# Patient Record
Sex: Male | Born: 1950 | Race: White | Hispanic: No | Marital: Married | Smoking: Never smoker
Health system: Southern US, Community
[De-identification: ages and names within clinical notes are randomized; demographics above are authoritative.]

## PROBLEM LIST (undated history)

## (undated) DIAGNOSIS — I499 Cardiac arrhythmia, unspecified: Secondary | ICD-10-CM

## (undated) DIAGNOSIS — E785 Hyperlipidemia, unspecified: Secondary | ICD-10-CM

## (undated) DIAGNOSIS — M1990 Unspecified osteoarthritis, unspecified site: Secondary | ICD-10-CM

## (undated) DIAGNOSIS — C801 Malignant (primary) neoplasm, unspecified: Secondary | ICD-10-CM

## (undated) DIAGNOSIS — K603 Anal fistula, unspecified: Secondary | ICD-10-CM

## (undated) DIAGNOSIS — I2510 Atherosclerotic heart disease of native coronary artery without angina pectoris: Secondary | ICD-10-CM

## (undated) DIAGNOSIS — C61 Malignant neoplasm of prostate: Secondary | ICD-10-CM

## (undated) DIAGNOSIS — R7303 Prediabetes: Secondary | ICD-10-CM

## (undated) DIAGNOSIS — G4733 Obstructive sleep apnea (adult) (pediatric): Secondary | ICD-10-CM

## (undated) DIAGNOSIS — I10 Essential (primary) hypertension: Secondary | ICD-10-CM

## (undated) DIAGNOSIS — K219 Gastro-esophageal reflux disease without esophagitis: Secondary | ICD-10-CM

## (undated) DIAGNOSIS — F32A Depression, unspecified: Secondary | ICD-10-CM

## (undated) DIAGNOSIS — G4730 Sleep apnea, unspecified: Secondary | ICD-10-CM

## (undated) HISTORY — DX: Essential (primary) hypertension: I10

## (undated) HISTORY — DX: Hyperlipidemia, unspecified: E78.5

## (undated) HISTORY — PX: CARDIAC CATHETERIZATION: SHX172

## (undated) HISTORY — DX: Atherosclerotic heart disease of native coronary artery without angina pectoris: I25.10

## (undated) HISTORY — PX: ABDOMINAL SURGERY: SHX537

## (undated) HISTORY — PX: COLONOSCOPY: SHX174

## (undated) HISTORY — DX: Gastro-esophageal reflux disease without esophagitis: K21.9

## (undated) HISTORY — DX: Morbid (severe) obesity due to excess calories: E66.01

---

## 2009-12-31 ENCOUNTER — Encounter: Payer: Self-pay | Admitting: Cardiology

## 2010-01-01 ENCOUNTER — Encounter: Payer: Self-pay | Admitting: Cardiology

## 2010-01-02 ENCOUNTER — Ambulatory Visit
Admission: AD | Admit: 2010-01-02 | Payer: Self-pay | Source: Home / Self Care | Attending: Cardiovascular Disease | Admitting: Cardiovascular Disease

## 2010-01-23 ENCOUNTER — Encounter: Payer: Self-pay | Admitting: Cardiology

## 2010-01-23 ENCOUNTER — Ambulatory Visit
Admission: RE | Admit: 2010-01-23 | Discharge: 2010-01-23 | Payer: Self-pay | Source: Home / Self Care | Attending: Cardiology | Admitting: Cardiology

## 2010-01-23 DIAGNOSIS — E669 Obesity, unspecified: Secondary | ICD-10-CM | POA: Insufficient documentation

## 2010-01-23 DIAGNOSIS — K219 Gastro-esophageal reflux disease without esophagitis: Secondary | ICD-10-CM | POA: Insufficient documentation

## 2010-01-23 DIAGNOSIS — R079 Chest pain, unspecified: Secondary | ICD-10-CM | POA: Insufficient documentation

## 2010-01-23 DIAGNOSIS — G4733 Obstructive sleep apnea (adult) (pediatric): Secondary | ICD-10-CM | POA: Insufficient documentation

## 2010-01-23 DIAGNOSIS — E7800 Pure hypercholesterolemia, unspecified: Secondary | ICD-10-CM | POA: Insufficient documentation

## 2010-02-05 ENCOUNTER — Encounter: Payer: Self-pay | Admitting: Cardiology

## 2010-02-19 NOTE — Letter (Signed)
Summary: Internal Correspondence/ FAXED PRE-CATH ORDERS  Internal Correspondence/ FAXED PRE-CATH ORDERS   Imported By: Dorise Hiss 01/13/2010 12:12:20  _____________________________________________________________________  External Attachment:    Type:   Image     Comment:   External Document

## 2010-02-19 NOTE — Assessment & Plan Note (Signed)
Summary: Phillip Rios   Visit Type:  Follow-up Primary Provider:  Kirstie Peri   History of Present Illness: the patient is a 60 year old male who was recently hospitalized at [facility] with atypical chest pain. The patient had negative cardiac markers but given the concern of multiple risk factors we proceeded with a cardiac catheterization. Catheterization demonstrated no significant coronary artery disease with normal right-sided heart pressures. LV function was also within normal limits. The patient states that he still is occasionally sharp chest pains on the left side particularly knifelike sensation. The only lasts a few seconds. He also reports severe symptoms of esophageal reflux disease with a sour taste in his mouth that he can taste pretty much throughout the whole night. He has been taking Zantac without much relief. He also reports significant snoring as told by his wife. She also has noticed that time she stops breathing. The patient's neck anatomy is compatible with obstructive sleep apnea.  From a cardiovascular standpoint is stable. He reports no groin complications. He reports no palpitations or syncope he has no orthopnea PND.  Preventive Screening-Counseling & Management  Alcohol-Tobacco     Smoking Status: never  Problems Prior to Update: 1)  Chest Pain Unspecified  (ICD-786.50) 2)  Esophageal Reflux  (ICD-530.81) 3)  Pure Hypercholesterolemia  (ICD-272.0) 4)  Long-term (CURRENT) Use of Other Medications  (ICD-V58.69) 5)  Long-term Use of Aspirin  (ICD-V58.66) 6)  Obesity, Unspecified  (ICD-278.00)  Current Medications (verified): 1)  Lisinopril 20 Mg Tabs (Lisinopril) .... Take 2 Tablet By Mouth Once A Day 2)  Amlodipine Besylate 10 Mg Tabs (Amlodipine Besylate) .... Take 1 Tablet By Mouth Once A Day 3)  Aspirin Ec 325 Mg Tbec (Aspirin) .... Take One Tablet By Mouth Daily 4)  Nexium 40 Mg Cpdr (Esomeprazole Magnesium) .... Take 1 Tablet By Mouth Once A  Day  Allergies (verified): No Known Drug Allergies  Comments:  Nurse/Medical Assistant: The patient's medications and allergies were reviewed with the patient and were updated in the Medication and Allergy Lists. Reviewed w/ pt. Tammi Romine CMA 02-18-2010 10:02 AM)  Past History:  Family History: Last updated: 2010/02/18 Father: deceased with HX of CHF and CAD Mother: Deceased Brother age 42 ,status post pacemaker  Social History: Last updated: 2010-02-18 Married  Tobacco Use - No.  Alcohol Use - no Works as a Games developer  Past Medical History: G E R D Hypertension Morbid Obesity Dyslipidemia cardiac catheterization December 2011 nonobstructive coronary artery disease, normal LV function and normal right-sided heart pressures.  Review of Systems       The patient complains of sleep apnea.  The patient denies fatigue, malaise, fever, weight gain/loss, vision loss, decreased hearing, hoarseness, chest pain, palpitations, shortness of breath, prolonged cough, wheezing, coughing up blood, abdominal pain, blood in stool, nausea, vomiting, diarrhea, heartburn, incontinence, blood in urine, muscle weakness, joint pain, leg swelling, rash, skin lesions, headache, fainting, dizziness, depression, anxiety, enlarged lymph nodes, easy bruising or bleeding, and environmental allergies.    Vital Signs:  Patient profile:   60 year old male Height:      73 inches Weight:      313 pounds BMI:     41.44 Pulse rate:   78 / minute BP sitting:   119 / 84  (left arm) Cuff size:   large  Vitals Entered By: Fuller Plan CMA February 18, 2010 10:02 AM)  Physical Exam  Additional Exam:  General: Overweight white male. head: Normocephalic  and atraumatic eyes PERRLA/EOMI intact, conjunctiva and lids normal nose: No deformity or lesions mouth normal dentition, normal posterior pharynx neck: Supple, no JVD.  No masses, thyromegaly or abnormal cervical nodes lungs: Normal breath  sounds bilaterally without wheezing.  Normal percussion heart: regular rate and rhythm with normal S1 and S2, no S3 or S4.  PMI is normal.  No pathological murmurs abdomen: Normal bowel sounds, abdomen is soft and nontender without masses, organomegaly or hernias noted.  No hepatosplenomegaly musculoskeletal: Back normal, normal gait muscle strength and tone normal pulsus: Pulse is normal in all 4 extremities Extremities: No peripheral pitting edema neurologic: Alert and oriented x 3 skin: Intact without lesions or rashes cervical nodes: No significant adenopathy psychologic: Normal affect    EKG  Procedure date:  01/02/2010  Findings:      Left main coronary artery was a short segment and it was normal.   Left anterior descending artery had a 30% eccentric lesion proximally, before this for first septal perforator. Mid and distal LAD were normal. First and second diagonal branches were normal.   Circumflex coronary artery was nondominant. First obtuse marginal branch was normal. Second obtuse marginal branch was large and normal. AV groove branch was small and normal.   Right coronary artery was dominant and normal.   RAO ventriculography: RAO ventriculography was normal. EF was 65%. There was no gradient across the aortic valve and no MR.   Right heart catheterization was done due to the patient's dyspnea.   Mean right atrial pressure was 8, RV pressure was 33/8, PA pressure was 30/14, LV pressure was 128/11, aortic pressure was 128/77.   The cardiac output was 5.2 liters per minute with a cardiac index of 2.0 liters per minute per meter squared.  Impression & Recommendations:  Problem # 1:  CHEST PAIN UNSPECIFIED (ICD-786.50) patient has no significant coronary artery disease. I suspect he may have esophageal spasm. I changed his Zantac and Nexium 40 Milligrams p.o. q. dailyday. I advised him to stop Zantac. His updated medication list for this problem includes:     Lisinopril 20 Mg Tabs (Lisinopril) .Marland Kitchen... Take 2 tablet by mouth once a day    Amlodipine Besylate 10 Mg Tabs (Amlodipine besylate) .Marland Kitchen... Take 1 tablet by mouth once a day    Aspirin Ec 325 Mg Tbec (Aspirin) .Marland Kitchen... Take one tablet by mouth daily  Problem # 2:  ESOPHAGEAL REFLUX (ICD-530.81) start Nexium. Also discussed weight loss with the patient.  His updated medication list for this problem includes:    Nexium 40 Mg Cpdr (Esomeprazole magnesium) .Marland Kitchen... Take 1 tablet by mouth once a day  Problem # 3:  OBSTRUCTIVE SLEEP APNEA (ICD-327.23) the patient has symptoms that could be compatible with sleep apnea. He will be referred for a sleep study.  Patient Instructions: 1)  Stop Zantac 2)  Nexium 40mg  daily  3)  Referral to Neurology for Sleep Study 4)  Follow up in  6 months Prescriptions: NEXIUM 40 MG CPDR (ESOMEPRAZOLE MAGNESIUM) Take 1 tablet by mouth once a day  #30 x 6   Entered by:   Hoover Brunette, LPN   Authorized by:   Lewayne Bunting, MD, [facility]   Signed by:   Hoover Brunette, LPN on 16/10/9602   Method used:   Electronically to        [facility] (retail)       [address] [facility]  [place], [place]  [address]       Ph: [phone]       Fax: [phone]   RxID:   [id number]

## 2010-02-19 NOTE — Miscellaneous (Signed)
Summary: Orders Update - Neurology  Clinical Lists Changes  Orders: Added new Referral order of Neurology Referral (Neuro) - Signed

## 2010-02-19 NOTE — Letter (Signed)
Summary: External Correspondence/ FAXED REFERRAL [facility]  External Correspondence/ FAXED REFERRAL [facility]   Imported By: Dorise Hiss 02/06/2010 15:37:05  _____________________________________________________________________  External Attachment:    Type:   Image     Comment:   External Document

## 2010-02-19 NOTE — Medication Information (Signed)
Summary: [facility] D/C MEDICATION ORDERS  [facility] D/C MEDICATION ORDERS   Imported By: Zachary George 01/23/2010 09:13:53  _____________________________________________________________________  External Attachment:    Type:   Image     Comment:   External Document

## 2010-02-19 NOTE — Letter (Signed)
Summary: Internal Other/ PATIENT HISTORY FORM  Internal Other/ PATIENT HISTORY FORM   Imported By: Dorise Hiss 01/28/2010 12:15:49  _____________________________________________________________________  External Attachment:    Type:   Image     Comment:   External Document

## 2010-02-19 NOTE — Consult Note (Signed)
Summary: CARDIOLOGY CONSULT/ [facility]  CARDIOLOGY CONSULT/ [facility]   Imported By: Zachary George 01/23/2010 10:24:31  _____________________________________________________________________  External Attachment:    Type:   Image     Comment:   External Document

## 2010-03-30 LAB — POCT I-STAT 3, VENOUS BLOOD GAS (G3P V)
O2 Saturation: 62 %
TCO2: 25 mmol/L (ref 0–100)

## 2010-03-30 LAB — POCT I-STAT 3, ART BLOOD GAS (G3+)
Acid-Base Excess: 1 mmol/L (ref 0.0–2.0)
Bicarbonate: 25.8 mEq/L — ABNORMAL HIGH (ref 20.0–24.0)
O2 Saturation: 94 %

## 2011-08-05 ENCOUNTER — Encounter: Payer: Self-pay | Admitting: Cardiology

## 2016-01-19 DIAGNOSIS — I471 Supraventricular tachycardia, unspecified: Secondary | ICD-10-CM

## 2016-01-19 DIAGNOSIS — I4891 Unspecified atrial fibrillation: Secondary | ICD-10-CM

## 2016-01-19 HISTORY — DX: Supraventricular tachycardia, unspecified: I47.10

## 2016-01-19 HISTORY — DX: Unspecified atrial fibrillation: I48.91

## 2016-01-19 HISTORY — DX: Supraventricular tachycardia: I47.1

## 2016-06-22 ENCOUNTER — Telehealth: Payer: Self-pay | Admitting: Cardiology

## 2016-06-22 ENCOUNTER — Encounter: Payer: Self-pay | Admitting: Cardiology

## 2016-06-22 ENCOUNTER — Encounter: Payer: Self-pay | Admitting: *Deleted

## 2016-06-22 ENCOUNTER — Ambulatory Visit (INDEPENDENT_AMBULATORY_CARE_PROVIDER_SITE_OTHER): Payer: BLUE CROSS/BLUE SHIELD | Admitting: Cardiology

## 2016-06-22 VITALS — BP 135/81 | HR 84 | Ht 73.0 in | Wt 325.0 lb

## 2016-06-22 DIAGNOSIS — G4730 Sleep apnea, unspecified: Secondary | ICD-10-CM

## 2016-06-22 DIAGNOSIS — I480 Paroxysmal atrial fibrillation: Secondary | ICD-10-CM

## 2016-06-22 DIAGNOSIS — R002 Palpitations: Secondary | ICD-10-CM

## 2016-06-22 DIAGNOSIS — I471 Supraventricular tachycardia: Secondary | ICD-10-CM | POA: Diagnosis not present

## 2016-06-22 MED ORDER — METOPROLOL SUCCINATE ER 50 MG PO TB24: 50.0000 mg | ORAL_TABLET | Freq: Every day | ORAL | 1 refills | Status: DC

## 2016-06-22 NOTE — Patient Instructions (Signed)
Your physician recommends that you schedule a follow-up appointment in: [place] has recommended you make the following change in your medication:   INCREASE TOPROL XL 50 MG DAILY  Your physician has requested that you have an echocardiogram. Echocardiography is a painless test that uses sound waves to create images of your heart. It provides your doctor with information about the size and shape of your heart and how well your heart's chambers and valves are working. This procedure takes approximately one hour. There are no restrictions for this procedure.  You have been referred to DR [facility]  Thank you for choosing [facility]!!

## 2016-06-22 NOTE — Progress Notes (Signed)
Clinical Summary Mr. Janowicz is a 66 y.o.male seen previously by Dr Dannielle Burn in 2012. Referred today as a new patient by Dr Manuella Ghazi for palpitations.    1. Palpitations - started about 1 year ago - 2-3 times a week. Feeling of butterflies in chest, can have some chest pain with it. Can have some SOB - coffee x1, iced tea x 3-4 glasses, no sodas, no energy drinks, no EtoH.   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 From my review also short episode of afib.  Low heart rate of 35 while sleeping, sinus brady - on Metoprolol x 1-2 months, no change in symptoms.   2. Chest pain - previous cardiac cath without significant CAD in 2011 - no recent symptoms, primarily has had palpitations recently.   3. OSA  - wearing CPAP machine. His CPAP has not been monitored by a physician    Past Medical History:  Diagnosis Date  . Coronary artery disease    non obstructive  . Dyslipidemia   . GERD (gastroesophageal reflux disease)   . Hypertension   . Morbid obesity ([place])      Allergies not on file   Current Outpatient Prescriptions  Medication Sig Dispense Refill  . aspirin EC 81 MG tablet Take 81 mg by mouth daily.    . hydrochlorothiazide (HYDRODIURIL) 25 MG tablet Take 25 mg by mouth daily.    Marland Kitchen lisinopril (PRINIVIL,ZESTRIL) 20 MG tablet Take 40 mg by mouth daily.    . meloxicam (MOBIC) 15 MG tablet Take 15 mg by mouth daily.    . metoprolol succinate (TOPROL-XL) 25 MG 24 hr tablet Take 25 mg by mouth daily.    . pantoprazole (PROTONIX) 40 MG tablet Take 40 mg by mouth daily.     No current facility-administered medications for this visit.      Past Surgical History:  Procedure Laterality Date  . CARDIAC CATHETERIZATION       Allergies not on file    Family History  Problem Relation Age of Onset  . Heart failure Father   . Heart disease Father      Social History Mr. Herrod reports that he has never smoked. He does not have any  smokeless tobacco history on file. Mr. Seoane reports that he does not drink alcohol.   Review of Systems CONSTITUTIONAL: No weight loss, fever, chills, weakness or fatigue.  HEENT: Eyes: No visual loss, blurred vision, double vision or yellow sclerae.No hearing loss, sneezing, congestion, runny nose or sore throat.  SKIN: No rash or itching.  CARDIOVASCULAR: per hpi RESPIRATORY: No shortness of breath, cough or sputum.  GASTROINTESTINAL: No anorexia, nausea, vomiting or diarrhea. No abdominal pain or blood.  GENITOURINARY: No burning on urination, no polyuria NEUROLOGICAL: No headache, dizziness, syncope, paralysis, ataxia, numbness or tingling in the extremities. No change in bowel or bladder control.  MUSCULOSKELETAL: No muscle, back pain, joint pain or stiffness.  LYMPHATICS: No enlarged nodes. No history of splenectomy.  PSYCHIATRIC: No history of depression or anxiety.  ENDOCRINOLOGIC: No reports of sweating, cold or heat intolerance. No polyuria or polydipsia.  Marland Kitchen   Physical Examination Vitals:   06/22/16 0918 06/22/16 0921  BP: 139/90 135/81  Pulse: 86 84   Vitals:   06/22/16 0918  Weight: (!) 325 lb (147.4 kg)  Height: 6\' 1"  (1.854 m)    Gen: resting comfortably, no acute distress HEENT: no scleral icterus, pupils equal round and reactive, no palptable cervical  adenopathy,  CV: RRR, no m/r/g, no jvd Resp: Clear to auscultation bilaterally GI: abdomen is soft, non-tender, non-distended, normal bowel sounds, no hepatosplenomegaly MSK: extremities are warm, no edema.  Skin: warm, no rash Neuro:  no focal deficits Psych: appropriate affect   Diagnostic Studies 12/2009 cath Procedure date:  01/02/2010  Findings:      Left main coronary artery was a short segment and it was normal.   Left anterior descending artery had a 30% eccentric lesion proximally, before this for first septal perforator. Mid and distal LAD were normal. First and second diagonal branches  were normal.   Circumflex coronary artery was nondominant. First obtuse marginal Mendi Constable was normal. Second obtuse marginal Tiwanda Threats was large and normal. AV groove Rubert Frediani was small and normal.   Right coronary artery was dominant and normal.   RAO ventriculography: RAO ventriculography was normal. EF was 65%. There was no gradient across the aortic valve and no MR.   Right heart catheterization was done due to the patient's dyspnea.   Mean right atrial pressure was 8, RV pressure was 33/8, PA pressure was 30/14, LV pressure was 128/11, aortic pressure was 128/77.   The cardiac output was 5.2 liters per minute with a cardiac index of 2.0 liters per minute per meter squared.    Assessment and Plan  1. Palpitations - previous montior showed episodes of PSVT, short episode of afib - we will increase Toprol XL to 50mg  daily for ongoing symptoms - CHADS2VASC score is 1, he will continue ASA   2. OSA - refer to Dr Luan Pulling, could be playing a role in his paroxysmal arrhythmias  3. Chest pain - previous cath without signfiicant cad - no recent symptoms - continue to monitor   F/u 3 months   Arnoldo Lenis, M.D.

## 2016-06-22 NOTE — Telephone Encounter (Signed)
Pre-cert Verification for the following procedure    Echo scheduled for 07-14-16 at [facility]

## 2016-06-30 ENCOUNTER — Other Ambulatory Visit: Payer: Self-pay | Admitting: Cardiology

## 2016-06-30 DIAGNOSIS — I480 Paroxysmal atrial fibrillation: Secondary | ICD-10-CM

## 2016-07-14 ENCOUNTER — Other Ambulatory Visit: Payer: Self-pay

## 2016-07-14 ENCOUNTER — Ambulatory Visit (INDEPENDENT_AMBULATORY_CARE_PROVIDER_SITE_OTHER): Payer: BLUE CROSS/BLUE SHIELD

## 2016-07-14 DIAGNOSIS — I480 Paroxysmal atrial fibrillation: Secondary | ICD-10-CM | POA: Diagnosis not present

## 2016-08-27 ENCOUNTER — Other Ambulatory Visit: Payer: Self-pay | Admitting: Cardiology

## 2016-09-28 ENCOUNTER — Ambulatory Visit: Payer: BLUE CROSS/BLUE SHIELD | Admitting: Cardiology

## 2016-10-25 ENCOUNTER — Other Ambulatory Visit: Payer: Self-pay | Admitting: Cardiology

## 2016-11-09 ENCOUNTER — Ambulatory Visit (INDEPENDENT_AMBULATORY_CARE_PROVIDER_SITE_OTHER): Payer: BLUE CROSS/BLUE SHIELD | Admitting: Urology

## 2016-11-09 DIAGNOSIS — N401 Enlarged prostate with lower urinary tract symptoms: Secondary | ICD-10-CM

## 2016-11-09 DIAGNOSIS — R351 Nocturia: Secondary | ICD-10-CM | POA: Diagnosis not present

## 2016-11-09 DIAGNOSIS — R972 Elevated prostate specific antigen [PSA]: Secondary | ICD-10-CM | POA: Diagnosis not present

## 2016-11-16 ENCOUNTER — Other Ambulatory Visit: Payer: Self-pay | Admitting: Urology

## 2016-11-16 DIAGNOSIS — R972 Elevated prostate specific antigen [PSA]: Secondary | ICD-10-CM

## 2016-11-30 ENCOUNTER — Ambulatory Visit (HOSPITAL_COMMUNITY)
Admission: RE | Admit: 2016-11-30 | Discharge: 2016-11-30 | Disposition: A | Discharge status: Home / Self Care | Payer: BLUE CROSS/BLUE SHIELD | Source: Ambulatory Visit | Attending: Urology | Admitting: Urology

## 2016-11-30 DIAGNOSIS — C61 Malignant neoplasm of prostate: Secondary | ICD-10-CM | POA: Insufficient documentation

## 2016-11-30 DIAGNOSIS — R972 Elevated prostate specific antigen [PSA]: Secondary | ICD-10-CM

## 2016-11-30 MED ORDER — GENTAMICIN SULFATE 40 MG/ML IJ SOLN
INTRAMUSCULAR | Status: AC
  Administered 2016-11-30: 160 mg via INTRAMUSCULAR
  Filled 2016-11-30: qty 4

## 2016-11-30 MED ORDER — GENTAMICIN SULFATE 40 MG/ML IJ SOLN
160.0000 mg | Freq: Once | INTRAMUSCULAR | Status: AC
Start: 2016-11-30 — End: 2016-11-30
  Administered 2016-11-30: 160 mg via INTRAMUSCULAR

## 2016-11-30 MED ORDER — LIDOCAINE HCL (PF) 2 % IJ SOLN
INTRAMUSCULAR | Status: AC
  Filled 2016-11-30: qty 10

## 2016-11-30 NOTE — Discharge Instructions (Signed)
Transrectal Ultrasound-Guided Biopsy °A transrectal ultrasound-guided biopsy is a procedure to remove samples of tissue from your prostate using ultrasound images to guide the procedure. The procedure is usually done to evaluate the prostate gland of men who have an elevated prostate-specific antigen (PSA). PSA is a blood test to screen for prostate cancer. The biopsy samples are taken to check for prostate cancer. °Tell a health care provider about: °· Any allergies you have. °· All medicines you are taking, including vitamins, herbs, eye drops, creams, and over-the-counter medicines. °· Any problems you or family members have had with anesthetic medicines. °· Any blood disorders you have. °· Any surgeries you have had. °· Any medical conditions you have. °What are the risks? °Generally, this is a safe procedure. However, as with any procedure, problems can occur. Possible problems include: °· Infection of your prostate. °· Bleeding from your rectum or blood in your urine. °· Difficulty urinating. °· Nerve damage (this is usually temporary). °· Damage to surrounding structures such as blood vessels, organs, and muscles, which would require other procedures. ° °What happens before the procedure? °· Do not eat or drink anything after midnight on the night before the procedure or as directed by your health care provider. °· Take medicines only as directed by your health care provider. °· Your health care provider may have you stop taking certain medicines 5-7 days before the procedure. °· You will be given an enema before the procedure. During an enema, a liquid is injected into your rectum to clear out waste. °· You may have lab tests the day of your procedure. °· Plan to have someone take you home after the procedure. °What happens during the procedure? °· You will be given medicine to help you relax (sedative) before the procedure. An IV tube will be inserted into one of your veins and used to give fluids and  medicine. °· You will be given antibiotic medicine to reduce the risk of an infection. °· You will be placed on your side for the procedure. °· A probe with lubricated gel will be placed into your rectum, and images will be taken of your prostate and surrounding structures. °· Numbing medicine will be injected into the prostate before the biopsy samples are taken. °· A biopsy needle will then be inserted and guided to your prostate with the use of the ultrasound images. °· Samples of prostate tissue will be taken, and the needle will then be removed. °· The biopsy samples will be sent to a lab to be analyzed. Results are usually back in 2-3 days. °What happens after the procedure? °· You will be taken to a recovery area where you will be monitored. °· You may have some discomfort in the rectal area. You will be given pain medicines to control this. °· You may be allowed to go home the same day, or you may need to stay in the hospital overnight. °This information is not intended to replace advice given to you by your health care provider. Make sure you discuss any questions you have with your health care provider. °Document Released: 05/21/2013 Document Revised: 06/12/2015 Document Reviewed: 08/23/2012 °Elsevier Interactive Patient Education © 2018 [employer] ° °

## 2016-12-15 ENCOUNTER — Other Ambulatory Visit: Payer: Self-pay | Admitting: Urology

## 2016-12-15 DIAGNOSIS — C61 Malignant neoplasm of prostate: Secondary | ICD-10-CM

## 2016-12-22 ENCOUNTER — Encounter (HOSPITAL_COMMUNITY): Payer: Self-pay

## 2016-12-22 ENCOUNTER — Encounter (HOSPITAL_COMMUNITY)
Admission: RE | Admit: 2016-12-22 | Discharge: 2016-12-22 | Disposition: A | Discharge status: Home / Self Care | Payer: BLUE CROSS/BLUE SHIELD | Source: Ambulatory Visit | Attending: Urology | Admitting: Urology

## 2016-12-22 DIAGNOSIS — C61 Malignant neoplasm of prostate: Secondary | ICD-10-CM | POA: Insufficient documentation

## 2016-12-22 HISTORY — DX: Malignant (primary) neoplasm, unspecified: C80.1

## 2016-12-22 MED ORDER — TECHNETIUM TC 99M MEDRONATE IV KIT
20.0000 | PACK | Freq: Once | INTRAVENOUS | Status: AC | PRN
  Administered 2016-12-22: 19.95 via INTRAVENOUS

## 2016-12-24 ENCOUNTER — Other Ambulatory Visit: Payer: Self-pay | Admitting: Urology

## 2016-12-24 DIAGNOSIS — C61 Malignant neoplasm of prostate: Secondary | ICD-10-CM

## 2017-01-06 ENCOUNTER — Encounter (HOSPITAL_COMMUNITY): Payer: Self-pay

## 2017-01-06 ENCOUNTER — Ambulatory Visit (HOSPITAL_COMMUNITY)
Admission: RE | Admit: 2017-01-06 | Discharge: 2017-01-06 | Disposition: A | Discharge status: Home / Self Care | Payer: BLUE CROSS/BLUE SHIELD | Source: Ambulatory Visit | Attending: Urology | Admitting: Urology

## 2017-01-06 DIAGNOSIS — M899 Disorder of bone, unspecified: Secondary | ICD-10-CM | POA: Diagnosis not present

## 2017-01-06 DIAGNOSIS — C61 Malignant neoplasm of prostate: Secondary | ICD-10-CM | POA: Diagnosis present

## 2017-01-06 LAB — POCT I-STAT, CHEM 8
BUN: 18 mg/dL (ref 6–20)
CHLORIDE: 102 mmol/L (ref 101–111)
CREATININE: 1.2 mg/dL (ref 0.61–1.24)
Calcium, Ion: 1.2 mmol/L (ref 1.15–1.40)
GLUCOSE: 94 mg/dL (ref 65–99)
HEMATOCRIT: 43 % (ref 39.0–52.0)
Hemoglobin: 14.6 g/dL (ref 13.0–17.0)
POTASSIUM: 3.5 mmol/L (ref 3.5–5.1)
Sodium: 140 mmol/L (ref 135–145)
TCO2: 26 mmol/L (ref 22–32)

## 2017-01-06 MED ORDER — IOPAMIDOL (ISOVUE-300) INJECTION 61%
125.0000 mL | Freq: Once | INTRAVENOUS | Status: AC | PRN
  Administered 2017-01-06: 120 mL via INTRAVENOUS

## 2017-02-15 ENCOUNTER — Other Ambulatory Visit: Payer: Self-pay | Admitting: Urology

## 2017-02-15 DIAGNOSIS — C61 Malignant neoplasm of prostate: Secondary | ICD-10-CM

## 2017-02-22 ENCOUNTER — Other Ambulatory Visit: Payer: Self-pay | Admitting: Urology

## 2017-02-22 ENCOUNTER — Ambulatory Visit (INDEPENDENT_AMBULATORY_CARE_PROVIDER_SITE_OTHER): Payer: BLUE CROSS/BLUE SHIELD | Admitting: Urology

## 2017-02-22 DIAGNOSIS — C61 Malignant neoplasm of prostate: Secondary | ICD-10-CM

## 2017-03-08 ENCOUNTER — Ambulatory Visit (HOSPITAL_COMMUNITY)
Admission: RE | Admit: 2017-03-08 | Discharge: 2017-03-08 | Disposition: A | Discharge status: Home / Self Care | Payer: BLUE CROSS/BLUE SHIELD | Source: Ambulatory Visit | Attending: Urology | Admitting: Urology

## 2017-03-08 ENCOUNTER — Inpatient Hospital Stay (HOSPITAL_COMMUNITY)
Admission: RE | Admit: 2017-03-08 | Discharge: 2017-03-08 | Disposition: A | Discharge status: Home / Self Care | Payer: BLUE CROSS/BLUE SHIELD | Source: Ambulatory Visit | Attending: Urology | Admitting: Urology

## 2017-03-08 ENCOUNTER — Encounter (HOSPITAL_COMMUNITY): Payer: Self-pay

## 2017-03-08 DIAGNOSIS — C61 Malignant neoplasm of prostate: Secondary | ICD-10-CM | POA: Insufficient documentation

## 2017-03-08 MED ORDER — LIDOCAINE HCL (PF) 2 % IJ SOLN
10.0000 mL | Freq: Once | INTRAMUSCULAR | Status: AC
  Administered 2017-03-08: 10 mL

## 2017-03-08 MED ORDER — GENTAMICIN SULFATE 40 MG/ML IJ SOLN
INTRAMUSCULAR | Status: AC
  Administered 2017-03-08: 80 mg via INTRAMUSCULAR
  Filled 2017-03-08: qty 2

## 2017-03-08 MED ORDER — GENTAMICIN SULFATE 40 MG/ML IJ SOLN
80.0000 mg | Freq: Once | INTRAMUSCULAR | Status: AC
  Administered 2017-03-08: 80 mg via INTRAMUSCULAR

## 2017-03-08 MED ORDER — LIDOCAINE HCL (PF) 2 % IJ SOLN
INTRAMUSCULAR | Status: AC
  Administered 2017-03-08: 10 mL
  Filled 2017-03-08: qty 10

## 2017-03-22 ENCOUNTER — Ambulatory Visit (INDEPENDENT_AMBULATORY_CARE_PROVIDER_SITE_OTHER): Payer: BLUE CROSS/BLUE SHIELD | Admitting: Urology

## 2017-03-22 DIAGNOSIS — C61 Malignant neoplasm of prostate: Secondary | ICD-10-CM | POA: Diagnosis not present

## 2017-07-15 ENCOUNTER — Telehealth: Payer: Self-pay

## 2017-07-15 ENCOUNTER — Emergency Department (HOSPITAL_COMMUNITY): Payer: BLUE CROSS/BLUE SHIELD

## 2017-07-15 ENCOUNTER — Encounter (HOSPITAL_COMMUNITY): Payer: Self-pay

## 2017-07-15 ENCOUNTER — Emergency Department (HOSPITAL_COMMUNITY)
Admission: EM | Admit: 2017-07-15 | Discharge: 2017-07-15 | Disposition: A | Discharge status: Home / Self Care | Payer: BLUE CROSS/BLUE SHIELD | Attending: Emergency Medicine | Admitting: Emergency Medicine

## 2017-07-15 DIAGNOSIS — I10 Essential (primary) hypertension: Secondary | ICD-10-CM | POA: Insufficient documentation

## 2017-07-15 DIAGNOSIS — Z7982 Long term (current) use of aspirin: Secondary | ICD-10-CM | POA: Diagnosis not present

## 2017-07-15 DIAGNOSIS — Z79899 Other long term (current) drug therapy: Secondary | ICD-10-CM | POA: Insufficient documentation

## 2017-07-15 DIAGNOSIS — R0789 Other chest pain: Secondary | ICD-10-CM | POA: Diagnosis not present

## 2017-07-15 DIAGNOSIS — Z8546 Personal history of malignant neoplasm of prostate: Secondary | ICD-10-CM | POA: Diagnosis not present

## 2017-07-15 DIAGNOSIS — I2510 Atherosclerotic heart disease of native coronary artery without angina pectoris: Secondary | ICD-10-CM | POA: Insufficient documentation

## 2017-07-15 DIAGNOSIS — R079 Chest pain, unspecified: Secondary | ICD-10-CM

## 2017-07-15 LAB — TROPONIN I: Troponin I: <0.03 ng/mL (ref <0.03)

## 2017-07-15 LAB — BASIC METABOLIC PANEL
Anion gap: 8 (ref 5–15)
BUN: 24 mg/dL — AB (ref 8–23)
CO2: 24 mmol/L (ref 22–32)
CREATININE: 1.17 mg/dL (ref 0.61–1.24)
Calcium: 9.3 mg/dL (ref 8.9–10.3)
Chloride: 106 mmol/L (ref 98–111)
GFR calc Af Amer: >60 mL/min (ref >60)
GFR calc non Af Amer: >60 mL/min (ref >60)
GLUCOSE: 114 mg/dL — AB (ref 70–99)
POTASSIUM: 3.5 mmol/L (ref 3.5–5.1)
Sodium: 138 mmol/L (ref 135–145)

## 2017-07-15 LAB — CBC
HEMATOCRIT: 37 % — AB (ref 39.0–52.0)
Hemoglobin: 12.7 g/dL — ABNORMAL LOW (ref 13.0–17.0)
MCH: 29.9 pg (ref 26.0–34.0)
MCHC: 34.3 g/dL (ref 30.0–36.0)
MCV: 87.1 fL (ref 78.0–100.0)
PLATELETS: 186 10*3/uL (ref 150–400)
RBC: 4.25 MIL/uL (ref 4.22–5.81)
RDW: 13.5 % (ref 11.5–15.5)
WBC: 4.9 10*3/uL (ref 4.0–10.5)

## 2017-07-15 LAB — LIPASE, BLOOD: Lipase: 27 U/L (ref 11–51)

## 2017-07-15 MED ORDER — IOPAMIDOL (ISOVUE-370) INJECTION 76%
100.0000 mL | Freq: Once | INTRAVENOUS | Status: AC | PRN
  Administered 2017-07-15: 100 mL via INTRAVENOUS

## 2017-07-15 MED ORDER — SODIUM CHLORIDE 0.9 % IV BOLUS
500.0000 mL | Freq: Once | INTRAVENOUS | Status: AC
Start: 2017-07-15 — End: 2017-07-15
  Administered 2017-07-15: 500 mL via INTRAVENOUS

## 2017-07-15 MED ORDER — ASPIRIN 81 MG PO CHEW
324.0000 mg | CHEWABLE_TABLET | Freq: Once | ORAL | Status: AC
  Administered 2017-07-15: 324 mg via ORAL
  Filled 2017-07-15: qty 4

## 2017-07-15 MED ORDER — KETOROLAC TROMETHAMINE 30 MG/ML IJ SOLN
30.0000 mg | Freq: Once | INTRAMUSCULAR | Status: AC
  Administered 2017-07-15: 30 mg via INTRAVENOUS
  Filled 2017-07-15: qty 1

## 2017-07-15 MED ORDER — FENTANYL CITRATE (PF) 100 MCG/2ML IJ SOLN
50.0000 ug | INTRAMUSCULAR | Status: DC | PRN
  Administered 2017-07-15: 50 ug via INTRAVENOUS
  Filled 2017-07-15: qty 2

## 2017-07-15 NOTE — ED Triage Notes (Signed)
Pt reports his chest pain started Sunday, he was admitted to [facility] Wednesday night. Reports pain never resolved. Pt reports that pain is left lower rib area and goes up chest into neck. Left arm pain for 2-3 weeks

## 2017-07-15 NOTE — Discharge Instructions (Signed)
If you were given medicines take as directed.  If you are on coumadin or contraceptives realize their levels and effectiveness is altered by many different medicines.  If you have any reaction (rash, tongues swelling, other) to the medicines stop taking and see a physician.    If your blood pressure was elevated in the ER make sure you follow up for management with a primary doctor or return for chest pain, shortness of breath or stroke symptoms.  Please follow up as directed and return to the ER or see a physician for new or worsening symptoms.  Thank you. Vitals:   07/15/17 1130 07/15/17 1200 07/15/17 1230 07/15/17 1300  BP: 121/68 131/81 137/84 127/81  Pulse: 76 62 65 61  Resp: 16 16 19 11   Temp:      TempSrc:      SpO2: 95% 95% 97% 97%  Weight:

## 2017-07-15 NOTE — Telephone Encounter (Signed)
Patients wife contacted office stating that patient has been having chest pain that radiates down left arm. Wife states they went to [facility], patient was given nitro and sent home. Patient is still having the radiating chest pain. Advised wife that patient should be evaluated by a cardiologist in the ER at [place]. Wife verbalized understanding. Wife states she will try to talk patient into going. Patient is past due for a follow up with Dr. Harl Bowie. Wife did not want to make appointment at this time.

## 2017-07-15 NOTE — ED Provider Notes (Signed)
[facility] EMERGENCY DEPARTMENT Provider Note   CSN: [id number] Arrival date & time: 07/15/17  0940     History   Chief Complaint Chief Complaint  Patient presents with  . Chest Pain    HPI Phillip Rios is a 67 y.o. male.  Patient with history of high blood pressure, prostate cancer finished radiation last month no current chemo, takes baby aspirin daily presents with worsening left-sided chest pain sharp left lower chest since Sunday.  Fairly constant at times.  At times worse with movement.  Radiation to left neck left shoulder.  No history of blood clots.  No leg swelling.  Patient was seen and observed overnight at [facility] and had blood work done and chest x-ray.  Patient has a stress test scheduled for mid July.  Patient denies cigarette smoking.     Past Medical History:  Diagnosis Date  . Cancer [facility])    prostate completed radiation 06/08/17  . Coronary artery disease    non obstructive  . Dyslipidemia   . GERD (gastroesophageal reflux disease)   . Hypertension   . Morbid obesity [facility])     Patient Active Problem List   Diagnosis Date Noted  . PURE HYPERCHOLESTEROLEMIA 01/23/2010  . OBESITY, UNSPECIFIED 01/23/2010  . OBSTRUCTIVE SLEEP APNEA 01/23/2010  . ESOPHAGEAL REFLUX 01/23/2010  . CHEST PAIN UNSPECIFIED 01/23/2010    Past Surgical History:  Procedure Laterality Date  . CARDIAC CATHETERIZATION     cath        Home Medications    Prior to Admission medications   Medication Sig Start Date End Date Taking? Authorizing Provider  amLODipine (NORVASC) 5 MG tablet Take 5 mg by mouth daily.   Yes [provider]  aspirin EC 81 MG tablet Take 81 mg by mouth daily.   Yes [provider]  hydrochlorothiazide (HYDRODIURIL) 25 MG tablet Take 25 mg by mouth daily.   Yes [provider]  lisinopril (PRINIVIL,ZESTRIL) 20 MG tablet Take 40 mg by mouth daily.   Yes [provider]  meloxicam (MOBIC) 15 MG tablet Take  15 mg by mouth daily.   Yes [provider]  pantoprazole (PROTONIX) 40 MG tablet Take 40 mg by mouth daily.   Yes [provider]  tamsulosin (FLOMAX) 0.4 MG CAPS capsule Take 0.4 mg by mouth at bedtime.   Yes [provider]  metoprolol succinate (TOPROL-XL) 50 MG 24 hr tablet TAKE 1 TABLET BY MOUTH ONCE DAILY Patient not taking: Reported on 07/15/2017 10/26/16   Arnoldo Lenis, MD    Family History Family History  Problem Relation Age of Onset  . Heart failure Father   . Heart disease Father     Social History Social History   Tobacco Use  . Smoking status: Never Smoker  . Smokeless tobacco: Current User    Types: Chew  Substance Use Topics  . Alcohol use: No  . Drug use: No     Allergies   Patient has no known allergies.   Review of Systems Review of Systems  Constitutional: Positive for appetite change. Negative for chills, diaphoresis and fever.  HENT: Negative for congestion.   Eyes: Negative for visual disturbance.  Respiratory: Negative for shortness of breath.   Cardiovascular: Positive for chest pain.  Gastrointestinal: Negative for abdominal pain and vomiting.  Genitourinary: Negative for dysuria and flank pain.  Musculoskeletal: Negative for back pain, joint swelling, neck pain and neck stiffness.  Skin: Negative for rash.  Neurological: Negative for  light-headedness and headaches.     Physical Exam Updated Vital Signs BP 127/81   Pulse 61   Temp 97.6 F (36.4 C) (Oral)   Resp 11   Wt (!) 146.5 kg (323 lb)   SpO2 97%   BMI 42.61 kg/m   Physical Exam  Constitutional: He is oriented to person, place, and time. He appears well-developed and well-nourished.  HENT:  Head: Normocephalic and atraumatic.  Eyes: Conjunctivae are normal. Right eye exhibits no discharge. Left eye exhibits no discharge.  Neck: Normal range of motion. Neck supple. No tracheal deviation present.  Cardiovascular: Normal rate and regular  rhythm.  Pulmonary/Chest: Effort normal and breath sounds normal.  Abdominal: Soft. He exhibits no distension. There is no tenderness. There is no guarding.  Musculoskeletal: He exhibits no edema.       Right lower leg: He exhibits no edema.       Left lower leg: He exhibits no edema.  Neurological: He is alert and oriented to person, place, and time.  Skin: Skin is warm. No rash noted.  Psychiatric: He has a normal mood and affect.  Nursing note and vitals reviewed.    ED Treatments / Results  Labs (all labs ordered are listed, but only abnormal results are displayed) Labs Reviewed  BASIC METABOLIC PANEL - Abnormal; Notable for the following components:      Result Value   Glucose, Bld 114 (*)    BUN 24 (*)    All other components within normal limits  CBC - Abnormal; Notable for the following components:   Hemoglobin 12.7 (*)    HCT 37.0 (*)    All other components within normal limits  TROPONIN I  LIPASE, BLOOD  TROPONIN I    EKG EKG Interpretation  Date/Time:  Friday July 15 2017 09:47:42 EDT Ventricular Rate:  81 PR Interval:    QRS Duration: 102 QT Interval:  388 QTC Calculation: 451 R Axis:   122 Text Interpretation:  Sinus rhythm Right axis deviation Low voltage, precordial leads Consider anterior infarct Borderline T abnormalities, inferior leads Confirmed by Elnora Morrison [phone]) on 07/15/2017 9:51:59 AM   Radiology Dg Chest 2 View  Result Date: 07/15/2017 CLINICAL DATA:  Chest pain.  Left arm pain. EXAM: CHEST - 2 VIEW COMPARISON:  CT 07/15/2017. FINDINGS: Mediastinum and hilar structures normal. Heart size normal. No focal infiltrate. No pleural effusion or pneumothorax. Degenerative change thoracic spine. IMPRESSION: No acute cardiopulmonary disease. Electronically Signed   By: Marcello Moores  Register   On: 07/15/2017 11:44   Ct Angio Chest Pe W And/or Wo Contrast  Result Date: 07/15/2017 CLINICAL DATA:  Chest pain beginning 5 days ago. Acute coronary system  suspected. EXAM: CT ANGIOGRAPHY CHEST WITH CONTRAST TECHNIQUE: Multidetector CT imaging of the chest was performed using the standard protocol during bolus administration of intravenous contrast. Multiplanar CT image reconstructions and MIPs were obtained to evaluate the vascular anatomy. CONTRAST:  120mL ISOVUE-370 IOPAMIDOL (ISOVUE-370) INJECTION 76% COMPARISON:  One-view chest x-ray 07/13/2017 at [facility]. CT of the chest 07/07/2015. FINDINGS: Cardiovascular: The heart size is normal. Aortic arch and great vessel origins are within normal limits. Main pulmonary outflow tract is within normal limits. There is satisfactory opacification the pulmonary arteries bilaterally. No focal filling defects are present to suggest pulmonary emboli. Coronary artery calcifications are present within the LAD and left circumflex. Mediastinum/Nodes: No significant mediastinal, axillary, or hilar adenopathy is present. Lungs/Pleura: Mild dependent atelectasis is present in both lungs. No focal nodule, mass, or airspace  disease is present otherwise. No significant pleural effusions are present. There is no significant pericardial effusion. Upper Abdomen: Mild fullness is present in the right adrenal gland without a discrete lesion. Left adrenal gland is normal. Visualized kidneys are within normal limits. Gallbladder is mostly collapsed. Visualized portions of the liver, spleen, and pancreas are within limits. Bowel is unremarkable. Musculoskeletal: An isolated sclerotic lesion involving the left posterolateral T9 vertebral body is well-defined and likely represents a bone island. Lesion is stable since 2017. No other focal lytic or blastic lesions are present. Vertebral body heights and alignment are normal. Review of the MIP images confirms the above findings. IMPRESSION: 1. No pulmonary embolus. 2. Coronary artery calcifications are present. 3. Mild dependent atelectasis in both lungs without other significant airspace  disease. Electronically Signed   By: San Morelle M.D.   On: 07/15/2017 11:24    Procedures Procedures (including critical care time)  Medications Ordered in ED Medications  fentaNYL (SUBLIMAZE) injection 50 mcg (50 mcg Intravenous Given 07/15/17 1041)  aspirin chewable tablet 324 mg (324 mg Oral Given 07/15/17 1007)  sodium chloride 0.9 % bolus 500 mL (0 mLs Intravenous Stopped 07/15/17 1247)  iopamidol (ISOVUE-370) 76 % injection 100 mL (100 mLs Intravenous Contrast Given 07/15/17 1102)  ketorolac (TORADOL) 30 MG/ML injection 30 mg (30 mg Intravenous Given 07/15/17 1248)     Initial Impression / Assessment and Plan / ED Course  I have reviewed the triage vital signs and the nursing notes.  Pertinent labs & imaging results that were available during my care of the patient were reviewed by me and considered in my medical decision making (see chart for details).    Patient with history of obesity and high blood pressure presents with worsening left-sided chest pain radiating to the left neck.  With age, risk factors and recurrent symptoms plan for cardiac screen and also CT scan of the chest to look for blood clots with his prostate cancer history.  Pain meds ordered. Pain resolved. Troponins negative. Card consult, recommended outpt fup. CT neg for pe. Labs unremarkable.  Results and differential diagnosis were discussed with the patient/parent/guardian. Xrays were independently reviewed by myself.  Close follow up outpatient was discussed, comfortable with the plan.   Medications  fentaNYL (SUBLIMAZE) injection 50 mcg (50 mcg Intravenous Given 07/15/17 1041)  aspirin chewable tablet 324 mg (324 mg Oral Given 07/15/17 1007)  sodium chloride 0.9 % bolus 500 mL (0 mLs Intravenous Stopped 07/15/17 1247)  iopamidol (ISOVUE-370) 76 % injection 100 mL (100 mLs Intravenous Contrast Given 07/15/17 1102)  ketorolac (TORADOL) 30 MG/ML injection 30 mg (30 mg Intravenous Given 07/15/17 1248)      Vitals:   07/15/17 1130 07/15/17 1200 07/15/17 1230 07/15/17 1300  BP: 121/68 131/81 137/84 127/81  Pulse: 76 62 65 61  Resp: 16 16 19 11   Temp:      TempSrc:      SpO2: 95% 95% 97% 97%  Weight:        Final diagnoses:  Acute chest pain     Final Clinical Impressions(s) / ED Diagnoses   Final diagnoses:  Acute chest pain    ED Discharge Orders    None       Elnora Morrison, MD 07/15/17 1356

## 2017-07-15 NOTE — Consult Note (Signed)
Cardiology Consultation:   Patient ID: Phillip Rios; [id number]; 04/29/1950   Admit date: 07/15/2017 Date of Consult: 07/15/2017  Primary Care Provider: Monico Blitz, MD Primary Cardiologist: Oluwaseyi Raffel    Patient Profile:   Phillip Rios is a 67 y.o. male with a hx of prostate cancer, HTN, HL who is being seen today for the evaluation of chest pain at the request of Dr Phillip Rios.  History of Present Illness:   [name] 67 yo male history of prostatce cancer, HL, HTN, palpitations with prior event montiro showing PSVT and was looked to be a short episode of afib (low CHADS2Vasc score and just on ASA), chest pain with cath 2011 without significant disease, OSA. Presents with chest pain  Symptoms started on the weekend, cannot recall if Saturday or Sunday. Left sided sharp pain in left rib cage, worst with position, deep breathing, and palpation. Has been constant since onset (nearly 1 week now), but varies in severity. Admitted earier this week to [facility], ruled out and discharged with plans for outpatient stress test.    CXR no acute process CT PE no PE. Noted coronary calcifications K 3.5 Cr 1.17, WBC 4.9 Hgb 12.7 Plt 186 Lipase 27 Trop neg x 1 06/2016 echo LVEF 93-90%, grade I diastolic dysfunction EKG SR, RAD, nonspecific ST/T changes 2011 cath 30% prox LAD, no obstructive disease.    Past Medical History:  Diagnosis Date  . Cancer [facility])    prostate completed radiation 06/08/17  . Coronary artery disease    non obstructive  . Dyslipidemia   . GERD (gastroesophageal reflux disease)   . Hypertension   . Morbid obesity ([place])     Past Surgical History:  Procedure Laterality Date  . CARDIAC CATHETERIZATION     cath     Inpatient Medications: Scheduled Meds:  Continuous Infusions: . sodium chloride 500 mL (07/15/17 1124)   PRN Meds: fentaNYL (SUBLIMAZE) injection  Allergies:   No Known Allergies  Social History:   Social History   Socioeconomic  History  . Marital status: Married    Spouse name: Not on file  . Number of children: Not on file  . Years of education: Not on file  . Highest education level: Not on file  Occupational History  . Not on file  Social Needs  . Financial resource strain: Not on file  . Food insecurity:    Worry: Not on file    Inability: Not on file  . Transportation needs:    Medical: Not on file    Non-medical: Not on file  Tobacco Use  . Smoking status: Never Smoker  . Smokeless tobacco: Current User    Types: Chew  Substance and Sexual Activity  . Alcohol use: No  . Drug use: No  . Sexual activity: Not on file  Lifestyle  . Physical activity:    Days per week: Not on file    Minutes per session: Not on file  . Stress: Not on file  Relationships  . Social connections:    Talks on phone: Not on file    Gets together: Not on file    Attends religious service: Not on file    Active member of club or organization: Not on file    Attends meetings of clubs or organizations: Not on file    Relationship status: Not on file  . Intimate partner violence:    Fear of current or ex partner: Not on file    Emotionally abused: Not on  file    Physically abused: Not on file    Forced sexual activity: Not on file  Other Topics Concern  . Not on file  Social History Narrative  . Not on file    Family History:    Family History  Problem Relation Age of Onset  . Heart failure Father   . Heart disease Father      ROS:  Please see the history of present illness.  All other ROS reviewed and negative.     Physical Exam/Data:   Vitals:   07/15/17 0950 07/15/17 1030 07/15/17 1115 07/15/17 1130  BP: (!) 141/94 123/76  121/68  Pulse: 82 75 82 76  Resp: (!) 30 18 19 16   Temp: 97.6 F (36.4 C)     TempSrc: Oral     SpO2: 95% 94% 97% 95%  Weight:       No intake or output data in the 24 hours ending 07/15/17 1211 Filed Weights   07/15/17 0948  Weight: (!) 323 lb (146.5 kg)   Body mass  index is 42.61 kg/m.  General:  Well nourished, well developed, in no acute distress HEENT: normal Lymph: no adenopathy Neck: no JVD Endocrine:  No thryomegaly Cardiac:  normal S1, S2; RRR; no murmur  Lungs:  clear to auscultation bilaterally, no wheezing, rhonchi or rales  Abd: soft, nontender, no hepatomegaly  Ext: no edema Musculoskeletal:  Left rib cage tender to palpation.  Skin: warm and dry  Neuro:  CNs 2-12 intact, no focal abnormalities noted Psych:  Normal affect    Laboratory Data:  Chemistry Recent Labs  Lab 07/15/17 1000  NA 138  K 3.5  CL 106  CO2 24  GLUCOSE 114*  BUN 24*  CREATININE 1.17  CALCIUM 9.3  GFRNONAA >60  GFRAA >60  ANIONGAP 8    No results for input(s): PROT, ALBUMIN, AST, ALT, ALKPHOS, BILITOT in the last 168 hours. Hematology Recent Labs  Lab 07/15/17 1000  WBC 4.9  RBC 4.25  HGB 12.7*  HCT 37.0*  MCV 87.1  MCH 29.9  MCHC 34.3  RDW 13.5  PLT 186   Cardiac Enzymes Recent Labs  Lab 07/15/17 1000  TROPONINI <0.03   No results for input(s): TROPIPOC in the last 168 hours.  BNPNo results for input(s): BNP, PROBNP in the last 168 hours.  DDimer No results for input(s): DDIMER in the last 168 hours.  Radiology/Studies:  Dg Chest 2 View  Result Date: 07/15/2017 CLINICAL DATA:  Chest pain.  Left arm pain. EXAM: CHEST - 2 VIEW COMPARISON:  CT 07/15/2017. FINDINGS: Mediastinum and hilar structures normal. Heart size normal. No focal infiltrate. No pleural effusion or pneumothorax. Degenerative change thoracic spine. IMPRESSION: No acute cardiopulmonary disease. Electronically Signed   By: Marcello Moores  Register   On: 07/15/2017 11:44   Ct Angio Chest Pe W And/or Wo Contrast  Result Date: 07/15/2017 CLINICAL DATA:  Chest pain beginning 5 days ago. Acute coronary system suspected. EXAM: CT ANGIOGRAPHY CHEST WITH CONTRAST TECHNIQUE: Multidetector CT imaging of the chest was performed using the standard protocol during bolus administration of  intravenous contrast. Multiplanar CT image reconstructions and MIPs were obtained to evaluate the vascular anatomy. CONTRAST:  170mL ISOVUE-370 IOPAMIDOL (ISOVUE-370) INJECTION 76% COMPARISON:  One-view chest x-ray 07/13/2017 at [facility]. CT of the chest 07/07/2015. FINDINGS: Cardiovascular: The heart size is normal. Aortic arch and great vessel origins are within normal limits. Main pulmonary outflow tract is within normal limits. There is satisfactory opacification the pulmonary  arteries bilaterally. No focal filling defects are present to suggest pulmonary emboli. Coronary artery calcifications are present within the LAD and left circumflex. Mediastinum/Nodes: No significant mediastinal, axillary, or hilar adenopathy is present. Lungs/Pleura: Mild dependent atelectasis is present in both lungs. No focal nodule, mass, or airspace disease is present otherwise. No significant pleural effusions are present. There is no significant pericardial effusion. Upper Abdomen: Mild fullness is present in the right adrenal gland without a discrete lesion. Left adrenal gland is normal. Visualized kidneys are within normal limits. Gallbladder is mostly collapsed. Visualized portions of the liver, spleen, and pancreas are within limits. Bowel is unremarkable. Musculoskeletal: An isolated sclerotic lesion involving the left posterolateral T9 vertebral body is well-defined and likely represents a bone island. Lesion is stable since 2017. No other focal lytic or blastic lesions are present. Vertebral body heights and alignment are normal. Review of the MIP images confirms the above findings. IMPRESSION: 1. No pulmonary embolus. 2. Coronary artery calcifications are present. 3. Mild dependent atelectasis in both lungs without other significant airspace disease. Electronically Signed   By: San Morelle M.D.   On: 07/15/2017 11:24    Assessment and Plan:   1. Noncardiac chest pain - chest pain constant nearly 7 days  at this point. Worst with position, breathing. Tender to palpation on exam - negative EKG and trops during recent [facility] admission, initial set negative here. With his cancer history CT PE was negative - consistent with [facility] pain. Will give 30mg  of IV toradol. Would plan 600mg  of ibuprofen tid x 5 days and hold his meloxicam, defer whether to consider any stronger prn pain meds to ER staff. No indication for admission, no further cardiac workup planned at this time      For questions or updates, please contact [place] Please consult [email or web address] for contact info under Cardiology/STEMI.   Merrily Pew, MD  07/15/2017 12:11 PM

## 2017-07-26 ENCOUNTER — Ambulatory Visit (INDEPENDENT_AMBULATORY_CARE_PROVIDER_SITE_OTHER): Payer: BLUE CROSS/BLUE SHIELD | Admitting: Urology

## 2017-07-26 DIAGNOSIS — C61 Malignant neoplasm of prostate: Secondary | ICD-10-CM

## 2017-08-29 ENCOUNTER — Other Ambulatory Visit: Payer: Self-pay | Admitting: Cardiology

## 2017-08-29 ENCOUNTER — Telehealth: Payer: Self-pay | Admitting: Cardiology

## 2017-08-29 ENCOUNTER — Ambulatory Visit (INDEPENDENT_AMBULATORY_CARE_PROVIDER_SITE_OTHER): Payer: BLUE CROSS/BLUE SHIELD | Admitting: Cardiology

## 2017-08-29 ENCOUNTER — Encounter: Payer: Self-pay | Admitting: Cardiology

## 2017-08-29 VITALS — BP 121/76 | HR 79 | Ht 73.0 in | Wt 328.6 lb

## 2017-08-29 DIAGNOSIS — R0602 Shortness of breath: Secondary | ICD-10-CM | POA: Diagnosis not present

## 2017-08-29 DIAGNOSIS — R0789 Other chest pain: Secondary | ICD-10-CM

## 2017-08-29 DIAGNOSIS — R9439 Abnormal result of other cardiovascular function study: Secondary | ICD-10-CM | POA: Diagnosis not present

## 2017-08-29 DIAGNOSIS — R0609 Other forms of dyspnea: Secondary | ICD-10-CM

## 2017-08-29 NOTE — Patient Instructions (Signed)
Medication Instructions:  Your physician recommends that you continue on your current medications as directed. Please refer to the Current Medication list given to you today.  Testing/Procedures:    [place] [address] Dept: [phone] Loc: [place]  08/29/2017  You are scheduled for a Cardiac Catheterization on Thursday, August 15 with Dr. Lauree Chandler.  1. Please arrive at the [facility] (Main Entrance A) at [facility]: [address] [place], [place] [address] at 11:30 AM (This time is two hours before your procedure to ensure your preparation). Free valet parking service is available.   Special note: Every effort is made to have your procedure done on time. Please understand that emergencies sometimes delay scheduled procedures.  2. Diet: Do not eat solid foods after midnight.  The patient may have clear liquids until 5am upon the day of the procedure.  3. Labs: You will need to have blood drawn on Tuesday, August 13 at [place]. [address], [place]  Open: 7am - 6pm, Sat 8am - 12 noon   Phone: [phone]. You do not need to be fasting.  4. Medication instructions in preparation for your procedure:   Contrast Allergy: No   On the morning of your procedure, take your Aspirin and any morning medicines NOT listed above.  You may use sips of water.  5. Plan for one night stay--bring personal belongings. 6. Bring a current list of your medications and current insurance cards. 7. You MUST have a responsible person to drive you home. 8. Someone MUST be with you the first 24 hours after you arrive home or your discharge will be delayed. 9. Please wear clothes that are easy to get on and off and wear slip-on shoes.  Thank you for allowing [place] to care for you!   -- [facility]  services  Follow-Up: Your physician recommends that you schedule a follow-up appointment in: [place] DR. BRANCH   Any Other Special Instructions Will Be Listed Below (If Applicable).  If you need a refill on your cardiac medications before your next appointment, please call your pharmacy.

## 2017-08-29 NOTE — H&P (View-Only) (Signed)
Clinical Summary Mr. Wassmer is a 67 y.o.male seen today for follow up of the following medical problems.    1. Chest pain - seen 06/2017 in ER with chest pain - noncardiac in description. Constant for 7 days, worst with palpation - CT PE was negative. Trop neg x 2. EKG without acute ischemic changes  - 08/2017 nuclear stress orderded by pcp completed at [facility]: severe areas of reversibility in the apex, lateral wall, and inferior wall all comletely reversible.  - treated for pleurisy by pcp, improved symptoms.   - buring feeling left sided. No specific pattern. Lasts about 1 minute. Occurs about 1-2 times per week. Some new SOB/DOE at about 1/2 block.  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 Cancer [facility])    prostate completed radiation 06/08/17  . Coronary artery disease    non obstructive  . Dyslipidemia   . GERD (gastroesophageal reflux disease)   . Hypertension   . Morbid obesity ([place])      No Known Allergies   Current Outpatient Medications  Medication Sig Dispense Refill  . amLODipine (NORVASC) 5 MG tablet Take 5 mg by mouth daily.    Marland Kitchen aspirin EC 81 MG tablet Take 81 mg by mouth daily.    . hydrochlorothiazide (HYDRODIURIL) 25 MG tablet Take 25 mg by mouth daily.    Marland Kitchen lisinopril (PRINIVIL,ZESTRIL) 20 MG tablet Take 40 mg by mouth daily.    . meloxicam (MOBIC) 15 MG tablet Take 15 mg by mouth daily.    . metoprolol succinate (TOPROL-XL) 50 MG 24 hr tablet TAKE 1 TABLET BY MOUTH ONCE DAILY (Patient not taking: Reported on 07/15/2017) 30 tablet 0  . pantoprazole (PROTONIX) 40 MG tablet Take 40 mg by mouth daily.    . tamsulosin (FLOMAX) 0.4 MG CAPS capsule Take 0.4 mg by mouth at bedtime.     No current facility-administered medications for this visit.      Past Surgical History:  Procedure Laterality Date  . CARDIAC CATHETERIZATION     cath      No Known Allergies    Family History  Problem Relation Age of Onset  . Heart failure Father   . Heart disease Father      Social History Mr. Gawronski reports that he has never smoked. His smokeless tobacco use includes chew. Mr. Daisey reports that he does not drink alcohol.   Review of Systems CONSTITUTIONAL: No weight loss, fever, chills, weakness or fatigue.  HEENT: Eyes: No visual loss, blurred vision, double vision or yellow sclerae.No hearing loss, sneezing, congestion, runny nose or sore throat.  SKIN: No rash or itching.  CARDIOVASCULAR: per hpi RESPIRATORY: per hpi GASTROINTESTINAL: No anorexia, nausea, vomiting or diarrhea. No abdominal pain or blood.  GENITOURINARY: No burning on urination, no polyuria NEUROLOGICAL: No headache, dizziness, syncope, paralysis, ataxia, numbness or tingling in the extremities. No change in bowel or bladder control.  MUSCULOSKELETAL: No muscle, back pain, joint pain or stiffness.  LYMPHATICS: No enlarged nodes. No history of splenectomy.  PSYCHIATRIC: No history of depression or anxiety.  ENDOCRINOLOGIC: No reports of sweating, cold or heat intolerance. No polyuria or polydipsia.  Marland Kitchen   Physical Examination Vitals:   08/29/17 1354  BP: 121/76  Pulse: 79  SpO2: 96%   Vitals:   08/29/17 1354  Weight: (!) 328 lb 9.6 oz (149.1 kg)  Height: 6\' 1"  (1.854 m)    Gen: resting comfortably, no acute distress HEENT: no scleral icterus, pupils equal round and reactive, no palptable cervical adenopathy,  CV Resp: Clear to auscultation bilaterally GI: abdomen is soft, non-tender, non-distended, normal bowel sounds, no hepatosplenomegaly MSK: extremities are warm, no edema.  Skin: warm, no rash Neuro:  no focal deficits Psych: appropriate affect    Assessment and Plan  1. Chest pain/DOE - atypical symptoms. Whats more worrisome is his significant DOE. Recent abnormal stress test with his pcp with multiple areas of ischemia - we  will plan for cath to further evaluate   I have reviewed the risks, indications, and alternatives to cardiac catheterization, possible angioplasty, and stenting with the patient and his wife today. Risks include but are not limited to bleeding, infection, vascular injury, stroke, myocardial infection, arrhythmia, kidney injury, radiation-related injury in the case of prolonged fluoroscopy use, emergency cardiac surgery, and death. The patient understands the risks of serious complication is 1-2 in 5797 with diagnostic cardiac cath and 1-2% or less with angioplasty/stenting.    F/u 6 weeks   Arnoldo Lenis, M.D.

## 2017-08-29 NOTE — Progress Notes (Signed)
Clinical Summary Mr. Chapdelaine is a 67 y.o.male seen today for follow up of the following medical problems.    1. Chest pain - seen 06/2017 in ER with chest pain - noncardiac in description. Constant for 7 days, worst with palpation - CT PE was negative. Trop neg x 2. EKG without acute ischemic changes  - 08/2017 nuclear stress orderded by pcp completed at [facility]: severe areas of reversibility in the apex, lateral wall, and inferior wall all comletely reversible.  - treated for pleurisy by pcp, improved symptoms.   - buring feeling left sided. No specific pattern. Lasts about 1 minute. Occurs about 1-2 times per week. Some new SOB/DOE at about 1/2 block.  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 Cancer [facility])    prostate completed radiation 06/08/17  . Coronary artery disease    non obstructive  . Dyslipidemia   . GERD (gastroesophageal reflux disease)   . Hypertension   . Morbid obesity ([place])      No Known Allergies   Current Outpatient Medications  Medication Sig Dispense Refill  . amLODipine (NORVASC) 5 MG tablet Take 5 mg by mouth daily.    Marland Kitchen aspirin EC 81 MG tablet Take 81 mg by mouth daily.    . hydrochlorothiazide (HYDRODIURIL) 25 MG tablet Take 25 mg by mouth daily.    Marland Kitchen lisinopril (PRINIVIL,ZESTRIL) 20 MG tablet Take 40 mg by mouth daily.    . meloxicam (MOBIC) 15 MG tablet Take 15 mg by mouth daily.    . metoprolol succinate (TOPROL-XL) 50 MG 24 hr tablet TAKE 1 TABLET BY MOUTH ONCE DAILY (Patient not taking: Reported on 07/15/2017) 30 tablet 0  . pantoprazole (PROTONIX) 40 MG tablet Take 40 mg by mouth daily.    . tamsulosin (FLOMAX) 0.4 MG CAPS capsule Take 0.4 mg by mouth at bedtime.     No current facility-administered medications for this visit.      Past Surgical History:  Procedure Laterality Date  . CARDIAC CATHETERIZATION     cath      No Known Allergies    Family History  Problem Relation Age of Onset  . Heart failure Father   . Heart disease Father      Social History Mr. Jurney reports that he has never smoked. His smokeless tobacco use includes chew. Mr. Gilkison reports that he does not drink alcohol.   Review of Systems CONSTITUTIONAL: No weight loss, fever, chills, weakness or fatigue.  HEENT: Eyes: No visual loss, blurred vision, double vision or yellow sclerae.No hearing loss, sneezing, congestion, runny nose or sore throat.  SKIN: No rash or itching.  CARDIOVASCULAR: per hpi RESPIRATORY: per hpi GASTROINTESTINAL: No anorexia, nausea, vomiting or diarrhea. No abdominal pain or blood.  GENITOURINARY: No burning on urination, no polyuria NEUROLOGICAL: No headache, dizziness, syncope, paralysis, ataxia, numbness or tingling in the extremities. No change in bowel or bladder control.  MUSCULOSKELETAL: No muscle, back pain, joint pain or stiffness.  LYMPHATICS: No enlarged nodes. No history of splenectomy.  PSYCHIATRIC: No history of depression or anxiety.  ENDOCRINOLOGIC: No reports of sweating, cold or heat intolerance. No polyuria or polydipsia.  Marland Kitchen   Physical Examination Vitals:   08/29/17 1354  BP: 121/76  Pulse: 79  SpO2: 96%   Vitals:   08/29/17 1354  Weight: (!) 328 lb 9.6 oz (149.1 kg)  Height: 6\' 1"  (1.854 m)    Gen: resting comfortably, no acute distress HEENT: no scleral icterus, pupils equal round and reactive, no palptable cervical adenopathy,  CV Resp: Clear to auscultation bilaterally GI: abdomen is soft, non-tender, non-distended, normal bowel sounds, no hepatosplenomegaly MSK: extremities are warm, no edema.  Skin: warm, no rash Neuro:  no focal deficits Psych: appropriate affect    Assessment and Plan  1. Chest pain/DOE - atypical symptoms. Whats more worrisome is his significant DOE. Recent abnormal stress test with his pcp with multiple areas of ischemia - we  will plan for cath to further evaluate   I have reviewed the risks, indications, and alternatives to cardiac catheterization, possible angioplasty, and stenting with the patient and his wife today. Risks include but are not limited to bleeding, infection, vascular injury, stroke, myocardial infection, arrhythmia, kidney injury, radiation-related injury in the case of prolonged fluoroscopy use, emergency cardiac surgery, and death. The patient understands the risks of serious complication is 1-2 in 5790 with diagnostic cardiac cath and 1-2% or less with angioplasty/stenting.    F/u 6 weeks   Arnoldo Lenis, M.D.

## 2017-08-29 NOTE — Telephone Encounter (Signed)
°  Precert needed for: L heart cath- shortness of breath   Location: [facility]   Date:

## 2017-08-30 ENCOUNTER — Telehealth: Payer: Self-pay | Admitting: *Deleted

## 2017-08-30 NOTE — Telephone Encounter (Addendum)
Pt contacted pre-catheterization scheduled at [facility] for: Thursday September 01, 2017 1:30 PM Verified arrival time and place: [place] Entrance A at: 11:30 AM  No solid food after midnight prior to cath, clear liquids until 5 AM day of procedure. Verified no known allergies.   Hold:  Lisinopril -day before and day of procedure. HCTZ -day before and day of procedure. Mobic-day before and day of procedure. GFR-58 08/29/17   Confirmed patient has responsible person to drive home post procedure and for 24 hours after you arrive home: yes

## 2017-09-01 ENCOUNTER — Encounter (HOSPITAL_COMMUNITY): Payer: Self-pay | Admitting: General Practice

## 2017-09-01 ENCOUNTER — Other Ambulatory Visit: Payer: Self-pay

## 2017-09-01 ENCOUNTER — Ambulatory Visit (HOSPITAL_COMMUNITY)
Admission: RE | Admit: 2017-09-01 | Discharge: 2017-09-02 | Disposition: A | Discharge status: Home / Self Care | Payer: BLUE CROSS/BLUE SHIELD | Source: Ambulatory Visit | Attending: Cardiovascular Disease | Admitting: Cardiovascular Disease

## 2017-09-01 ENCOUNTER — Encounter (HOSPITAL_COMMUNITY)
Admission: RE | Disposition: A | Discharge status: Home / Self Care | Payer: Self-pay | Source: Ambulatory Visit | Attending: Cardiovascular Disease

## 2017-09-01 ENCOUNTER — Telehealth: Payer: Self-pay | Admitting: *Deleted

## 2017-09-01 DIAGNOSIS — Z8249 Family history of ischemic heart disease and other diseases of the circulatory system: Secondary | ICD-10-CM | POA: Insufficient documentation

## 2017-09-01 DIAGNOSIS — Z7982 Long term (current) use of aspirin: Secondary | ICD-10-CM | POA: Diagnosis not present

## 2017-09-01 DIAGNOSIS — E7800 Pure hypercholesterolemia, unspecified: Secondary | ICD-10-CM | POA: Diagnosis not present

## 2017-09-01 DIAGNOSIS — E669 Obesity, unspecified: Secondary | ICD-10-CM | POA: Diagnosis present

## 2017-09-01 DIAGNOSIS — Z955 Presence of coronary angioplasty implant and graft: Secondary | ICD-10-CM

## 2017-09-01 DIAGNOSIS — I25110 Atherosclerotic heart disease of native coronary artery with unstable angina pectoris: Secondary | ICD-10-CM | POA: Diagnosis not present

## 2017-09-01 DIAGNOSIS — I10 Essential (primary) hypertension: Secondary | ICD-10-CM | POA: Insufficient documentation

## 2017-09-01 DIAGNOSIS — K219 Gastro-esophageal reflux disease without esophagitis: Secondary | ICD-10-CM | POA: Insufficient documentation

## 2017-09-01 DIAGNOSIS — G4733 Obstructive sleep apnea (adult) (pediatric): Secondary | ICD-10-CM | POA: Diagnosis not present

## 2017-09-01 DIAGNOSIS — I200 Unstable angina: Secondary | ICD-10-CM

## 2017-09-01 DIAGNOSIS — R0609 Other forms of dyspnea: Secondary | ICD-10-CM

## 2017-09-01 DIAGNOSIS — Z720 Tobacco use: Secondary | ICD-10-CM | POA: Insufficient documentation

## 2017-09-01 DIAGNOSIS — Z6841 Body Mass Index (BMI) 40.0 and over, adult: Secondary | ICD-10-CM | POA: Insufficient documentation

## 2017-09-01 HISTORY — DX: Sleep apnea, unspecified: G47.30

## 2017-09-01 HISTORY — PX: LEFT HEART CATH AND CORONARY ANGIOGRAPHY: CATH118249

## 2017-09-01 HISTORY — PX: INTRAVASCULAR PRESSURE WIRE/FFR STUDY: CATH118243

## 2017-09-01 HISTORY — PX: CORONARY STENT INTERVENTION: CATH118234

## 2017-09-01 LAB — POCT ACTIVATED CLOTTING TIME
ACTIVATED CLOTTING TIME: 202 s
ACTIVATED CLOTTING TIME: 301 s

## 2017-09-01 SURGERY — LEFT HEART CATH AND CORONARY ANGIOGRAPHY
Anesthesia: LOCAL

## 2017-09-01 MED ORDER — PANTOPRAZOLE SODIUM 40 MG PO TBEC
40.0000 mg | DELAYED_RELEASE_TABLET | Freq: Every day | ORAL | Status: DC
  Administered 2017-09-01 – 2017-09-02 (×2): 40 mg via ORAL
  Filled 2017-09-01 (×2): qty 1

## 2017-09-01 MED ORDER — ADENOSINE (DIAGNOSTIC) 140MCG/KG/MIN
INTRAVENOUS | Status: DC | PRN
  Administered 2017-09-01: 140 ug/kg/min via INTRAVENOUS

## 2017-09-01 MED ORDER — SODIUM CHLORIDE 0.9% FLUSH
3.0000 mL | Freq: Two times a day (BID) | INTRAVENOUS | Status: DC
  Administered 2017-09-01: 22:00:00 3 mL via INTRAVENOUS

## 2017-09-01 MED ORDER — VERAPAMIL HCL 2.5 MG/ML IV SOLN
INTRAVENOUS | Status: AC
  Filled 2017-09-01: qty 2

## 2017-09-01 MED ORDER — HEPARIN (PORCINE) IN NACL 1000-0.9 UT/500ML-% IV SOLN
INTRAVENOUS | Status: DC | PRN
  Administered 2017-09-01 (×2): 500 mL

## 2017-09-01 MED ORDER — CLOPIDOGREL BISULFATE 300 MG PO TABS
ORAL_TABLET | ORAL | Status: DC | PRN
  Administered 2017-09-01: 600 mg via ORAL

## 2017-09-01 MED ORDER — SODIUM CHLORIDE 0.9 % IV SOLN: 250.0000 mL | INTRAVENOUS | Status: DC | PRN

## 2017-09-01 MED ORDER — FENTANYL CITRATE (PF) 100 MCG/2ML IJ SOLN
INTRAMUSCULAR | Status: DC | PRN
  Administered 2017-09-01: 50 ug via INTRAVENOUS

## 2017-09-01 MED ORDER — LISINOPRIL 40 MG PO TABS
40.0000 mg | ORAL_TABLET | Freq: Every day | ORAL | Status: DC
  Administered 2017-09-01 – 2017-09-02 (×2): 40 mg via ORAL
  Filled 2017-09-01 (×2): qty 1

## 2017-09-01 MED ORDER — HEPARIN (PORCINE) IN NACL 1000-0.9 UT/500ML-% IV SOLN
INTRAVENOUS | Status: AC
  Filled 2017-09-01: qty 1000

## 2017-09-01 MED ORDER — IOHEXOL 350 MG/ML SOLN
INTRAVENOUS | Status: DC | PRN
  Administered 2017-09-01: 120 mL via INTRA_ARTERIAL

## 2017-09-01 MED ORDER — ANGIOPLASTY BOOK
Freq: Once | Status: AC
  Administered 2017-09-01: 22:00:00
  Filled 2017-09-01: qty 1

## 2017-09-01 MED ORDER — HYDROCHLOROTHIAZIDE 25 MG PO TABS
25.0000 mg | ORAL_TABLET | Freq: Every day | ORAL | Status: DC
  Administered 2017-09-02: 25 mg via ORAL
  Filled 2017-09-01: qty 1

## 2017-09-01 MED ORDER — ATORVASTATIN CALCIUM 80 MG PO TABS
80.0000 mg | ORAL_TABLET | Freq: Every day | ORAL | Status: DC
  Administered 2017-09-01: 80 mg via ORAL
  Filled 2017-09-01: qty 1

## 2017-09-01 MED ORDER — FENTANYL CITRATE (PF) 100 MCG/2ML IJ SOLN
INTRAMUSCULAR | Status: AC
  Filled 2017-09-01: qty 2

## 2017-09-01 MED ORDER — CLOPIDOGREL BISULFATE 75 MG PO TABS
75.0000 mg | ORAL_TABLET | Freq: Every day | ORAL | Status: DC
  Administered 2017-09-02: 75 mg via ORAL
  Filled 2017-09-01: qty 1

## 2017-09-01 MED ORDER — SODIUM CHLORIDE 0.9% FLUSH: 3.0000 mL | INTRAVENOUS | Status: DC | PRN

## 2017-09-01 MED ORDER — MIDAZOLAM HCL 2 MG/2ML IJ SOLN
INTRAMUSCULAR | Status: AC
  Filled 2017-09-01: qty 2

## 2017-09-01 MED ORDER — MIDAZOLAM HCL 2 MG/2ML IJ SOLN
INTRAMUSCULAR | Status: DC | PRN
  Administered 2017-09-01: 2 mg via INTRAVENOUS

## 2017-09-01 MED ORDER — HEPARIN SODIUM (PORCINE) 1000 UNIT/ML IJ SOLN
INTRAMUSCULAR | Status: AC
  Filled 2017-09-01: qty 1

## 2017-09-01 MED ORDER — LABETALOL HCL 5 MG/ML IV SOLN: 10.0000 mg | INTRAVENOUS | Status: AC | PRN

## 2017-09-01 MED ORDER — SODIUM CHLORIDE 0.9 % IV SOLN
INTRAVENOUS | Status: AC
  Administered 2017-09-01: 17:00:00 via INTRAVENOUS

## 2017-09-01 MED ORDER — LIDOCAINE HCL (PF) 1 % IJ SOLN
INTRAMUSCULAR | Status: DC | PRN
  Administered 2017-09-01: 2 mL

## 2017-09-01 MED ORDER — HYDRALAZINE HCL 20 MG/ML IJ SOLN: 5.0000 mg | INTRAMUSCULAR | Status: AC | PRN

## 2017-09-01 MED ORDER — FAMOTIDINE IN NACL 20-0.9 MG/50ML-% IV SOLN
INTRAVENOUS | Status: AC | PRN
  Administered 2017-09-01: 20 mg via INTRAVENOUS

## 2017-09-01 MED ORDER — SODIUM CHLORIDE 0.9 % WEIGHT BASED INFUSION: 1.0000 mL/kg/h | INTRAVENOUS | Status: DC

## 2017-09-01 MED ORDER — FAMOTIDINE IN NACL 20-0.9 MG/50ML-% IV SOLN
INTRAVENOUS | Status: AC
  Filled 2017-09-01: qty 50

## 2017-09-01 MED ORDER — ACETAMINOPHEN 325 MG PO TABS: 650.0000 mg | ORAL_TABLET | ORAL | Status: DC | PRN

## 2017-09-01 MED ORDER — HEPARIN (PORCINE) IN NACL 2-0.9 UNITS/ML
INTRAMUSCULAR | Status: DC | PRN
  Administered 2017-09-01: 10 mL via INTRA_ARTERIAL

## 2017-09-01 MED ORDER — AMLODIPINE BESYLATE 5 MG PO TABS
5.0000 mg | ORAL_TABLET | Freq: Every day | ORAL | Status: DC
  Administered 2017-09-02: 5 mg via ORAL
  Filled 2017-09-01: qty 1

## 2017-09-01 MED ORDER — ADENOSINE 12 MG/4ML IV SOLN
INTRAVENOUS | Status: AC
  Filled 2017-09-01: qty 16

## 2017-09-01 MED ORDER — SODIUM CHLORIDE 0.9% FLUSH: 3.0000 mL | Freq: Two times a day (BID) | INTRAVENOUS | Status: DC

## 2017-09-01 MED ORDER — ASPIRIN 81 MG PO CHEW: 81.0000 mg | CHEWABLE_TABLET | ORAL | Status: DC

## 2017-09-01 MED ORDER — SODIUM CHLORIDE 0.9 % WEIGHT BASED INFUSION
3.0000 mL/kg/h | INTRAVENOUS | Status: DC
  Administered 2017-09-01: 3 mL/kg/h via INTRAVENOUS

## 2017-09-01 MED ORDER — ASPIRIN EC 81 MG PO TBEC
81.0000 mg | DELAYED_RELEASE_TABLET | Freq: Every day | ORAL | Status: DC
  Administered 2017-09-02: 81 mg via ORAL
  Filled 2017-09-01: qty 1

## 2017-09-01 MED ORDER — CLOPIDOGREL BISULFATE 300 MG PO TABS
ORAL_TABLET | ORAL | Status: AC
  Filled 2017-09-01: qty 2

## 2017-09-01 MED ORDER — ONDANSETRON HCL 4 MG/2ML IJ SOLN: 4.0000 mg | Freq: Four times a day (QID) | INTRAMUSCULAR | Status: DC | PRN

## 2017-09-01 MED ORDER — LIDOCAINE HCL (PF) 1 % IJ SOLN
INTRAMUSCULAR | Status: AC
  Filled 2017-09-01: qty 30

## 2017-09-01 MED ORDER — HEPARIN SODIUM (PORCINE) 1000 UNIT/ML IJ SOLN
INTRAMUSCULAR | Status: DC | PRN
  Administered 2017-09-01 (×2): 6000 [IU] via INTRAVENOUS
  Administered 2017-09-01: 5000 [IU] via INTRAVENOUS
  Administered 2017-09-01: 3000 [IU] via INTRAVENOUS

## 2017-09-01 SURGICAL SUPPLY — 20 items
BALLN SAPPHIRE 2.0X10 (BALLOONS) ×2
BALLN SAPPHIRE ~~LOC~~ 3.0X8 (BALLOONS) ×2 IMPLANT
BALLOON SAPPHIRE 2.0X10 (BALLOONS) ×1 IMPLANT
CATH 5FR JL3.5 JR4 ANG PIG MP (CATHETERS) ×2 IMPLANT
CATH MICROCATH NAVVUS (MICROCATHETER) ×1 IMPLANT
CATH VISTA GUIDE 6FR XBLAD3.5 (CATHETERS) ×2 IMPLANT
DEVICE RAD COMP TR BAND LRG (VASCULAR PRODUCTS) ×2 IMPLANT
GLIDESHEATH SLEND SS 6F .021 (SHEATH) ×2 IMPLANT
GUIDEWIRE INQWIRE 1.5J.035X260 (WIRE) ×1 IMPLANT
HOVERMATT SINGLE USE (MISCELLANEOUS) ×2 IMPLANT
INQWIRE 1.5J .035X260CM (WIRE) ×2
KIT ENCORE 26 ADVANTAGE (KITS) ×2 IMPLANT
KIT HEART LEFT (KITS) ×2 IMPLANT
MICROCATHETER NAVVUS (MICROCATHETER) ×2
PACK CARDIAC CATHETERIZATION (CUSTOM PROCEDURE TRAY) ×2 IMPLANT
STENT SIERRA 2.75 X 12 MM (Permanent Stent) ×2 IMPLANT
SYR MEDRAD MARK V 150ML (SYRINGE) ×2 IMPLANT
TRANSDUCER W/STOPCOCK (MISCELLANEOUS) ×2 IMPLANT
TUBING CIL FLEX 10 FLL-RA (TUBING) ×2 IMPLANT
WIRE COUGAR XT STRL 190CM (WIRE) ×2 IMPLANT

## 2017-09-01 NOTE — Progress Notes (Addendum)
[facility] BAND REMOVAL  LOCATION:    right radial  DEFLATED PER PROTOCOL:    Yes.    TIME BAND OFF / DRESSING APPLIED:    1945   SITE UPON ARRIVAL:    Level 0  SITE AFTER BAND REMOVAL:    Level 0  CIRCULATION SENSATION AND MOVEMENT:    Within Normal Limits   Yes.    COMMENTS:   Pt.tolerated well.No bleeding or hematoma noted post removal instructions given.

## 2017-09-01 NOTE — Progress Notes (Signed)
Late entry: Patient set up on CPAP with [employer] and no 02 bleeding in. Patient wearing FFM and tolerating well. No issues at this time.

## 2017-09-01 NOTE — Telephone Encounter (Signed)
Pt aware - routed to pcp  

## 2017-09-01 NOTE — Interval H&P Note (Signed)
History and Physical Interval Note:  09/01/2017 1:00 PM  Phillip Rios  has presented today for surgery, with the diagnosis of abnormal stress test, unstable angina. The various methods of treatment have been discussed with the patient and family. After consideration of risks, benefits and other options for treatment, the patient has consented to  Procedure(s): LEFT HEART CATH AND CORONARY ANGIOGRAPHY (N/A) as a surgical intervention .  The patient's history has been reviewed, patient examined, no change in status, stable for surgery.  I have reviewed the patient's chart and labs.  Questions were answered to the patient's satisfaction.    Cath Lab Visit (complete for each Cath Lab visit)  Clinical Evaluation Leading to the Procedure:   ACS: No.  Non-ACS:    Anginal Classification: CCS II  Anti-ischemic medical therapy: Maximal Therapy (2 or more classes of medications)  Non-Invasive Test Results: High-risk stress test findings: cardiac mortality >3%/year  Prior CABG: No previous CABG         Lauree Chandler

## 2017-09-01 NOTE — Telephone Encounter (Signed)
-----   Message from Arnoldo Lenis, MD sent at 08/30/2017  4:25 PM EDT ----- Labs look good   Zandra Abts MD

## 2017-09-02 ENCOUNTER — Encounter (HOSPITAL_COMMUNITY): Payer: Self-pay | Admitting: Cardiovascular Disease

## 2017-09-02 DIAGNOSIS — G4733 Obstructive sleep apnea (adult) (pediatric): Secondary | ICD-10-CM | POA: Diagnosis not present

## 2017-09-02 DIAGNOSIS — I25110 Atherosclerotic heart disease of native coronary artery with unstable angina pectoris: Secondary | ICD-10-CM | POA: Diagnosis not present

## 2017-09-02 DIAGNOSIS — I200 Unstable angina: Secondary | ICD-10-CM | POA: Diagnosis not present

## 2017-09-02 DIAGNOSIS — I10 Essential (primary) hypertension: Secondary | ICD-10-CM | POA: Diagnosis not present

## 2017-09-02 DIAGNOSIS — Z955 Presence of coronary angioplasty implant and graft: Secondary | ICD-10-CM

## 2017-09-02 DIAGNOSIS — E7800 Pure hypercholesterolemia, unspecified: Secondary | ICD-10-CM | POA: Diagnosis not present

## 2017-09-02 LAB — BASIC METABOLIC PANEL
Anion gap: 6 (ref 5–15)
BUN: 21 mg/dL (ref 8–23)
CHLORIDE: 105 mmol/L (ref 98–111)
CO2: 27 mmol/L (ref 22–32)
CREATININE: 1.29 mg/dL — AB (ref 0.61–1.24)
Calcium: 8.9 mg/dL (ref 8.9–10.3)
GFR calc non Af Amer: 57 mL/min — ABNORMAL LOW (ref >60)
Glucose, Bld: 104 mg/dL — ABNORMAL HIGH (ref 70–99)
POTASSIUM: 3.9 mmol/L (ref 3.5–5.1)
SODIUM: 138 mmol/L (ref 135–145)

## 2017-09-02 LAB — CBC
HEMATOCRIT: 34.1 % — AB (ref 39.0–52.0)
Hemoglobin: 11.2 g/dL — ABNORMAL LOW (ref 13.0–17.0)
MCH: 29.5 pg (ref 26.0–34.0)
MCHC: 32.8 g/dL (ref 30.0–36.0)
MCV: 89.7 fL (ref 78.0–100.0)
PLATELETS: 170 10*3/uL (ref 150–400)
RBC: 3.8 MIL/uL — AB (ref 4.22–5.81)
RDW: 13.2 % (ref 11.5–15.5)
WBC: 3.8 10*3/uL — AB (ref 4.0–10.5)

## 2017-09-02 LAB — LIPID PANEL
CHOL/HDL RATIO: 5.6 ratio
Cholesterol: 156 mg/dL (ref 0–200)
HDL: 28 mg/dL — AB (ref >40)
LDL Cholesterol: 75 mg/dL (ref 0–99)
Triglycerides: 265 mg/dL — ABNORMAL HIGH (ref <150)
VLDL: 53 mg/dL — ABNORMAL HIGH (ref 0–40)

## 2017-09-02 MED ORDER — ATORVASTATIN CALCIUM 80 MG PO TABS: 80.0000 mg | ORAL_TABLET | Freq: Every day | ORAL | 3 refills | Status: DC

## 2017-09-02 MED ORDER — NITROGLYCERIN 0.4 MG SL SUBL: 0.4000 mg | SUBLINGUAL_TABLET | SUBLINGUAL | Status: DC | PRN

## 2017-09-02 MED ORDER — NITROGLYCERIN 0.4 MG SL SUBL: 0.4000 mg | SUBLINGUAL_TABLET | SUBLINGUAL | 2 refills | Status: DC | PRN

## 2017-09-02 MED ORDER — CLOPIDOGREL BISULFATE 75 MG PO TABS: 75.0000 mg | ORAL_TABLET | Freq: Every day | ORAL | 3 refills | Status: DC

## 2017-09-02 MED ORDER — METOPROLOL SUCCINATE ER 25 MG PO TB24: 25.0000 mg | ORAL_TABLET | Freq: Every day | ORAL | 3 refills | Status: DC

## 2017-09-02 NOTE — Discharge Summary (Addendum)
Discharge Summary    Patient ID: Phillip Rios,  MRN: [id number], DOB/AGE: [birth year] 67 y.o.  Admit date: 09/01/2017 Discharge date: 09/02/2017  Primary Care Provider: Monico Blitz Primary Cardiologist: Carlyle Dolly, MD  Discharge Diagnoses    Principal Problem:   Unstable angina [facility]) Active Problems:   Pure hypercholesterolemia   Obesity, unspecified   OBSTRUCTIVE SLEEP APNEA   Hypertension   Status post insertion of drug-eluting stent into left anterior descending (LAD) artery for coronary artery disease   Allergies No Known Allergies  Diagnostic Studies/Procedures     Left heart cath 09/01/17:  Prox LAD lesion is 80% stenosed.  A drug-eluting stent was successfully placed using a STENT SIERRA 2.75 X 12 MM.  Post intervention, there is a 0% residual stenosis.  The left ventricular systolic function is normal.  LV end diastolic pressure is normal.  The left ventricular ejection fraction is 55-65% by visual estimate.  There is no mitral valve regurgitation.  1. Severe stenosis mid LAD. The stenosis was eccentric and in some views did not appear to be severe. FFR 0.8 in the mid LAD suggesting the stenosis was likely flow limiting. Given the abnormal FFR and the abnormal nuclear stress test, PCI was performed.  2. Successful PTCA/DES x 1 mid LAD 3. Normal LV systolic function  Recommendations: Will start a statin.  Recommend uninterrupted dual antiplatelet therapy with Aspirin 81mg  daily and Clopidogrel 75mg  dailyfor a minimum of 12 months (ACS - Class I recommendation).  History of Present Illness     Pt has a history of OSA on CPAP, HTN, HLD, CAD, and morbid obesity. He underwent a nuclear stress test ordered by his PCP at [facility] for DEO. Nuclear stress test with sever areas of reversibility in the apex, lateral wall, and inferior wall. He was seen and evaluated by Dr. Harl Bowie on 08/29/17 and he was scheduled for heart cath on  09/01/17.  Hospital Course     Consultants: none  CAD s/p DES to LAD Pt presented to [facility] for scheduled heart cath. LHC revealed severe stenosis in the mid LAD with FFR 0.8. DES was successfully placed. Normal LV function was noted. He tolerated the procedure well. Right radial access site C/D/I. He is having no bleeding problems. Discharge on ASA and plavix. Add low dose toprol and high intensity statin.  HTN Pressures are running high. Continue norvasc 5 mg, HCTZ 25 mg, and 40 mg lisinopril. May need to adjust norvasc in the outpatient setting. Will add low dose toprol.  HLD Lipid profile pending. He as discharged on 80 mg lipitor.   Morbid obesity, OSA on CPAP He is doing well on CPAP. We discussed importance of a walking program.   _____________  Discharge Vitals Blood pressure (!) 150/78, pulse 65, temperature 98.1 F (36.7 C), temperature source Oral, resp. rate (!) 21, height 6\' 1"  (1.854 m), weight (!) 147.6 kg, SpO2 97 %.  Filed Weights   09/01/17 1125 09/02/17 0324  Weight: (!) 147 kg (!) 147.6 kg    Labs & Radiologic Studies    CBC Recent Labs    09/02/17 0440  WBC 3.8*  HGB 11.2*  HCT 34.1*  MCV 89.7  PLT 397   Basic Metabolic Panel Recent Labs    09/02/17 0440  NA 138  K 3.9  CL 105  CO2 27  GLUCOSE 104*  BUN 21  CREATININE 1.29*  CALCIUM 8.9   Liver Function Tests No results for input(s): AST, ALT, ALKPHOS,  BILITOT, PROT, ALBUMIN in the last 72 hours. No results for input(s): LIPASE, AMYLASE in the last 72 hours. Cardiac Enzymes No results for input(s): CKTOTAL, CKMB, CKMBINDEX, TROPONINI in the last 72 hours. BNP Invalid input(s): POCBNP D-Dimer No results for input(s): DDIMER in the last 72 hours. Hemoglobin A1C No results for input(s): HGBA1C in the last 72 hours. Fasting Lipid Panel Recent Labs    09/02/17 0440  CHOL 156  HDL 28*  LDLCALC 75  TRIG 265*  CHOLHDL 5.6   Thyroid Function Tests No results for input(s): TSH,  T4TOTAL, T3FREE, THYROIDAB in the last 72 hours.  Invalid input(s): FREET3 _____________  No results found. Disposition   Pt is being discharged home today in good condition.  Follow-up Plans & Appointments    Follow-up Information    Arnoldo Lenis, MD Follow up on 10/12/2017.   Specialty:  Cardiology Why:  2:20 pm Contact information: [place] [address] [phone]          Discharge Instructions    Amb Referral to Cardiac Rehabilitation   Complete by:  As directed    Diagnosis:  Coronary Stents   Diet - low sodium heart healthy   Complete by:  As directed    Discharge instructions   Complete by:  As directed    No driving for 2 days. No lifting over 5 lbs for 1 week. No sexual activity for 1 week. You may return to work in 1 week. Keep procedure site clean & dry. If you notice increased pain, swelling, bleeding or pus, call/return!  You may shower, but no soaking baths/hot tubs/pools for 1 week.   Increase activity slowly   Complete by:  As directed       Discharge Medications   Allergies as of 09/02/2017   No Known Allergies     Medication List    STOP taking these medications   meloxicam 15 MG tablet Commonly known as:  MOBIC     TAKE these medications   amLODipine 5 MG tablet Commonly known as:  NORVASC Take 5 mg by mouth daily.   aspirin EC 81 MG tablet Take 81 mg by mouth daily.   atorvastatin 80 MG tablet Commonly known as:  LIPITOR Take 1 tablet (80 mg total) by mouth daily at 6 PM.   clopidogrel 75 MG tablet Commonly known as:  PLAVIX Take 1 tablet (75 mg total) by mouth daily with breakfast.   hydrochlorothiazide 25 MG tablet Commonly known as:  HYDRODIURIL Take 25 mg by mouth daily.   ICY HOT EX Apply 1 application topically at bedtime as needed (knee pain).   lisinopril 20 MG tablet Commonly known as:  PRINIVIL,ZESTRIL Take 40 mg by mouth daily.   metoprolol succinate 25 MG 24 hr  tablet Commonly known as:  TOPROL-XL Take 1 tablet (25 mg total) by mouth daily. Take with or immediately following a meal. What changed:    medication strength  how much to take  additional instructions   nitroGLYCERIN 0.4 MG SL tablet Commonly known as:  NITROSTAT Place 1 tablet (0.4 mg total) under the tongue every 5 (five) minutes as needed for chest pain.   pantoprazole 40 MG tablet Commonly known as:  PROTONIX Take 40 mg by mouth daily.        Acute coronary syndrome (MI, NSTEMI, STEMI, etc) this admission?: No.    Outstanding Labs/Studies   Needs fasting lipids repeated in 6-8 weeks  Duration of Discharge  Encounter   Greater than 30 minutes including physician time.  Signed, Tami Lin [place] Haub, PA 09/02/2017, 10:41 AM

## 2017-09-02 NOTE — Progress Notes (Signed)
CARDIAC REHAB PHASE I   PRE:  Rate/Rhythm: 66 SR  BP:  Sitting: 135/90      SaO2: 97 RA  MODE:  Ambulation: 800 ft 106 peak HR  POST:  Rate/Rhythm: 76 SR  BP:  Sitting: 138/59    SaO2: 98 RA   Pt ambulated 85ft in hallway independently. Pt denies CP or SOB. Reviewed importance of Plavix, ASA, statin, and NTG with pt and family. Stent card at bedside. Pt given heart healthy diet. Reviewed restrictions and exercise guidelines. Will refer pt to CRP II [place].   [id number] Rufina Falco, RN BSN 09/02/2017 10:44 AM

## 2017-09-02 NOTE — Progress Notes (Addendum)
Progress Note  Patient Name: Phillip Rios Date of Encounter: 09/02/2017  Primary Cardiologist: Carlyle Dolly, MD   Subjective   Pt did well overnight, no chest pain, he is ready for discharge.  Inpatient Medications    Scheduled Meds: . amLODipine  5 mg Oral Daily  . aspirin EC  81 mg Oral Daily  . atorvastatin  80 mg Oral q1800  . clopidogrel  75 mg Oral Q breakfast  . hydrochlorothiazide  25 mg Oral Daily  . lisinopril  40 mg Oral Daily  . pantoprazole  40 mg Oral Daily  . sodium chloride flush  3 mL Intravenous Q12H   Continuous Infusions: . sodium chloride     PRN Meds: sodium chloride, acetaminophen, ondansetron (ZOFRAN) IV, sodium chloride flush   Vital Signs    Vitals:   09/01/17 1945 09/01/17 2000 09/01/17 2233 09/02/17 0324  BP: (!) 152/75 (!) 143/82  140/88  Pulse: 78 70 66 72  Resp: 19 17 20 15   Temp:  98.3 F (36.8 C)  97.6 F (36.4 C)  TempSrc:  Oral  Oral  SpO2: 97% 97% 97% 97%  Weight:    (!) 147.6 kg  Height:        Intake/Output Summary (Last 24 hours) at 09/02/2017 0715 Last data filed at 09/02/2017 0050 Gross per 24 hour  Intake 585 ml  Output 950 ml  Net -365 ml   Filed Weights   09/01/17 1125 09/02/17 0324  Weight: (!) 147 kg (!) 147.6 kg    Telemetry    Sinus  - Personally Reviewed  ECG    No new tracings - Personally Reviewed  Physical Exam   GEN: No acute distress.   Neck: No JVD Cardiac: RRR, no murmurs, rubs, or gallops.  Respiratory: Clear to auscultation bilaterally. GI: Soft, nontender, non-distended  MS: No edema; No deformity. Right radial C/D/I Neuro:  Nonfocal  Psych: Normal affect   Labs    Chemistry Recent Labs  Lab 09/02/17 0440  NA 138  K 3.9  CL 105  CO2 27  GLUCOSE 104*  BUN 21  CREATININE 1.29*  CALCIUM 8.9  GFRNONAA 57*  GFRAA >60  ANIONGAP 6     Hematology Recent Labs  Lab 09/02/17 0440  WBC 3.8*  RBC 3.80*  HGB 11.2*  HCT 34.1*  MCV 89.7  MCH 29.5  MCHC 32.8    RDW 13.2  PLT 170    Cardiac EnzymesNo results for input(s): TROPONINI in the last 168 hours. No results for input(s): TROPIPOC in the last 168 hours.   BNPNo results for input(s): BNP, PROBNP in the last 168 hours.   DDimer No results for input(s): DDIMER in the last 168 hours.   Radiology    No results found.  Cardiac Studies   Left heart cath 09/01/17:  Prox LAD lesion is 80% stenosed.  A drug-eluting stent was successfully placed using a STENT SIERRA 2.75 X 12 MM.  Post intervention, there is a 0% residual stenosis.  The left ventricular systolic function is normal.  LV end diastolic pressure is normal.  The left ventricular ejection fraction is 55-65% by visual estimate.  There is no mitral valve regurgitation.   1. Severe stenosis mid LAD. The stenosis was eccentric and in some views did not appear to be severe. FFR 0.8 in the mid LAD suggesting the stenosis was likely flow limiting. Given the abnormal FFR and the abnormal nuclear stress test, PCI was performed.  2. Successful PTCA/DES x  1 mid LAD 3. Normal LV systolic function  Recommendations: Will start a statin.  Recommend uninterrupted dual antiplatelet therapy with Aspirin 81mg  daily and Clopidogrel 75mg  daily for a minimum of 12 months (ACS - Class I recommendation).  Patient Profile     67 y.o. male with a hx of OSA, HTN, HLD, CAD, and morbid obesity; presented to [facility] for scheduled heart cath following abnormal stress test.  Assessment & Plan    1. CAD - heart cath yesterday with disease in the mid LAD with FFR 0.8, s/p DES placed - normal LVEF - patient tolerated procedure well - discharge on ASA and plavix x 12 months   2. HTN - pressures have been elevated - home medications include norvasc 5 mg, HCTZ 25 mg, and lisinopril 40 mg - monitor after morning medications today, may need to adjust norvasc dose if  Not controlled   3. HLD - lipid profile pending - on lipitor 80 mg   4.  Obesity - discussed walking program, including cardiac rehab    For questions or updates, please contact [place] Please consult [email or web address] for contact info under Cardiology/STEMI.      Signed, Tami Lin Duke, PA  09/02/2017, 7:15 AM     Patient seen and examined. Agree with assessment and plan. Feels well; cath data reviewed. S/p mid LAD stent.  No recurrent chest pain.  R radial site stable. On CPAP for OSA. DC today. F/U Dr. Harl Bowie.   Troy Sine, MD, [facility] 09/02/2017 10:43 AM

## 2017-09-06 ENCOUNTER — Telehealth: Payer: Self-pay | Admitting: Cardiology

## 2017-09-06 NOTE — Telephone Encounter (Signed)
Patient contacted regarding discharge from [facility] on 09/02/2017.  Patient understands to follow up with Dr. Harl Bowie on 09/20/2017 at 3:40 in [place] office.   Patient understands discharge instructions?  Yes  Patient understands medications and regiment?  Yes  Patient understands to bring all medications to this visit?  Yes

## 2017-09-06 NOTE — Telephone Encounter (Signed)
TOC -  Patient is discharging from [facility] 09/02/2017  after heart cath with stent. He's sees Dr. Harl Bowie. Can you please make a follow up appt in 7-14 days?

## 2017-09-20 ENCOUNTER — Encounter: Payer: Self-pay | Admitting: Cardiology

## 2017-09-20 ENCOUNTER — Ambulatory Visit (INDEPENDENT_AMBULATORY_CARE_PROVIDER_SITE_OTHER): Payer: BLUE CROSS/BLUE SHIELD | Admitting: Cardiology

## 2017-09-20 VITALS — BP 115/75 | HR 74 | Ht 73.0 in | Wt 328.4 lb

## 2017-09-20 DIAGNOSIS — I2510 Atherosclerotic heart disease of native coronary artery without angina pectoris: Secondary | ICD-10-CM | POA: Diagnosis not present

## 2017-09-20 NOTE — Patient Instructions (Signed)
Your physician recommends that you schedule a follow-up appointment in: 4 MONTHS WITH DR BRANCH  Your physician recommends that you continue on your current medications as directed. Please refer to the Current Medication list given to you today.  Thank you for choosing [facility]!!    

## 2017-09-20 NOTE — Progress Notes (Signed)
Clinical Summary Mr. Tygart is a 67 y.o.male seen today for follow up of the following medical problems.    1. Chest pain - seen 06/2017 in ER with chest pain - noncardiac in description. Constant for 7 days, worst with palpation - CT PE was negative. Trop neg x 2. EKG without acute ischemic changes  - 08/2017 nuclear stress orderded by pcp completed at [facility]: severe areas of reversibility in the apex, lateral wall, and inferior wall all comletely reversible.  - treated for pleurisy by pcp, improved symptoms.    08/2017 cath with 80% prox LAD lesion, s/p DES. - still with chest pain at times. Different from prior symptoms, more epigastric. He states he stopped his protonix after his pharmacist told him not to take with plavix.   - he is taking zantac just prn - SOB somewhat improved since visit.  - limited from cardiac rehab due to times   SH: works as Engineer, building services.   Past Medical History:  Diagnosis Date  . Cancer [facility])    prostate completed radiation 06/08/17  . Coronary artery disease    non obstructive  . Dyslipidemia   . GERD (gastroesophageal reflux disease)   . Hypertension   . Morbid obesity ([place])   . Sleep apnea    USES CPAP     No Known Allergies   Current Outpatient Medications  Medication Sig Dispense Refill  . amLODipine (NORVASC) 5 MG tablet Take 5 mg by mouth daily.    Marland Kitchen aspirin EC 81 MG tablet Take 81 mg by mouth daily.    Marland Kitchen atorvastatin (LIPITOR) 80 MG tablet Take 1 tablet (80 mg total) by mouth daily at 6 PM. 90 tablet 3  . clopidogrel (PLAVIX) 75 MG tablet Take 1 tablet (75 mg total) by mouth daily with breakfast. 90 tablet 3  . hydrochlorothiazide (HYDRODIURIL) 25 MG tablet Take 25 mg by mouth daily.    Marland Kitchen lisinopril (PRINIVIL,ZESTRIL) 20 MG tablet Take 40 mg by mouth daily.    . Menthol, Topical Analgesic, (ICY HOT EX) Apply 1 application topically at bedtime as needed (knee pain).    . metoprolol succinate (TOPROL-XL) 25 MG 24 hr  tablet Take 1 tablet (25 mg total) by mouth daily. Take with or immediately following a meal. 90 tablet 3  . nitroGLYCERIN (NITROSTAT) 0.4 MG SL tablet Place 1 tablet (0.4 mg total) under the tongue every 5 (five) minutes as needed for chest pain. 25 tablet 2   No current facility-administered medications for this visit.      Past Surgical History:  Procedure Laterality Date  . CARDIAC CATHETERIZATION     cath  . COLONOSCOPY    . CORONARY STENT INTERVENTION  09/01/2017  . CORONARY STENT INTERVENTION N/A 09/01/2017   Procedure: CORONARY STENT INTERVENTION;  Surgeon: Burnell Blanks, MD;  Location: [place] [facility];  Service: Cardiovascular;  Laterality: N/A;  . INTRAVASCULAR PRESSURE WIRE/FFR STUDY N/A 09/01/2017   Procedure: INTRAVASCULAR PRESSURE WIRE/FFR STUDY;  Surgeon: Burnell Blanks, MD;  Location: [place] [facility];  Service: Cardiovascular;  Laterality: N/A;  . LEFT HEART CATH AND CORONARY ANGIOGRAPHY N/A 09/01/2017   Procedure: LEFT HEART CATH AND CORONARY ANGIOGRAPHY;  Surgeon: Burnell Blanks, MD;  Location: [place] [facility];  Service: Cardiovascular;  Laterality: N/A;     No Known Allergies    Family History  Problem Relation Age of Onset  . Heart failure Father   . Heart disease Father  Social History Mr. Skow reports that he has never smoked. His smokeless tobacco use includes chew. Mr. Pullin reports that he does not drink alcohol.   Review of Systems CONSTITUTIONAL: No weight loss, fever, chills, weakness or fatigue.  HEENT: Eyes: No visual loss, blurred vision, double vision or yellow sclerae.No hearing loss, sneezing, congestion, runny nose or sore throat.  SKIN: No rash or itching.  CARDIOVASCULAR: per hpi RESPIRATORY: No shortness of breath, cough or sputum.  GASTROINTESTINAL: No anorexia, nausea, vomiting or diarrhea. No abdominal pain or blood.  GENITOURINARY: No burning on urination, no polyuria NEUROLOGICAL: No  headache, dizziness, syncope, paralysis, ataxia, numbness or tingling in the extremities. No change in bowel or bladder control.  MUSCULOSKELETAL: No muscle, back pain, joint pain or stiffness.  LYMPHATICS: No enlarged nodes. No history of splenectomy.  PSYCHIATRIC: No history of depression or anxiety.  ENDOCRINOLOGIC: No reports of sweating, cold or heat intolerance. No polyuria or polydipsia.  Marland Kitchen   Physical Examination Vitals:   09/20/17 1541  BP: 115/75  Pulse: 74  SpO2: 95%   Filed Weights   09/20/17 1541  Weight: (!) 328 lb 6.4 oz (149 kg)    Gen: resting comfortably, no acute distress HEENT: no scleral icterus, pupils equal round and reactive, no palptable cervical adenopathy,  CV: RRR, no m/r,g, no jvd Resp: Clear to auscultation bilaterally GI: abdomen is soft, non-tender, non-distended, normal bowel sounds, no hepatosplenomegaly MSK: extremities are warm, no edema.  Skin: warm, no rash Neuro:  no focal deficits Psych: appropriate affect   Diagnostic Studies   08/2017 cath  Prox LAD lesion is 80% stenosed.  A drug-eluting stent was successfully placed using a STENT SIERRA 2.75 X 12 MM.  Post intervention, there is a 0% residual stenosis.  The left ventricular systolic function is normal.  LV end diastolic pressure is normal.  The left ventricular ejection fraction is 55-65% by visual estimate.  There is no mitral valve regurgitation.   1. Severe stenosis mid LAD. The stenosis was eccentric and in some views did not appear to be severe. FFR 0.8 in the mid LAD suggesting the stenosis was likely flow limiting. Given the abnormal FFR and the abnormal nuclear stress test, PCI was performed.  2. Successful PTCA/DES x 1 mid LAD 3. Normal LV systolic function  Post cath Recommendations: Will start a statin.  Recommend uninterrupted dual antiplatelet therapy with Aspirin 81mg  daily and Clopidogrel 75mg  daily for a minimum of 12 months (ACS - Class I  recommendation).  06/2016 echo Study Conclusions  - Procedure narrative: Transthoracic echocardiography. Image   quality was suboptimal with poor valvular and endocardial   visualization in several views. - Left ventricle: The cavity size was normal. Wall thickness was   increased in a pattern of mild LVH. Systolic function was   vigorous. The estimated ejection fraction was in the range of 65%   to 70%. Images were inadequate for LV wall motion assessment.   Doppler parameters are consistent with abnormal left ventricular   relaxation (grade 1 diastolic dysfunction). - Aorta: Mild aortic root enlargement.   Assessment and Plan  1. CAD - s/p DES to LAD.  - continue current medical therapy  2. GERD - can continue zantac but would take daily, if not effective would be ok to take protonix with plavix     F/u 4 months   Arnoldo Lenis, M.D

## 2017-10-12 ENCOUNTER — Encounter

## 2017-10-12 ENCOUNTER — Ambulatory Visit: Payer: BLUE CROSS/BLUE SHIELD | Admitting: Cardiology

## 2017-11-29 ENCOUNTER — Ambulatory Visit (INDEPENDENT_AMBULATORY_CARE_PROVIDER_SITE_OTHER): Payer: BLUE CROSS/BLUE SHIELD | Admitting: Urology

## 2017-11-29 DIAGNOSIS — R351 Nocturia: Secondary | ICD-10-CM | POA: Diagnosis not present

## 2017-11-29 DIAGNOSIS — C61 Malignant neoplasm of prostate: Secondary | ICD-10-CM

## 2017-11-29 DIAGNOSIS — N401 Enlarged prostate with lower urinary tract symptoms: Secondary | ICD-10-CM | POA: Diagnosis not present

## 2018-01-25 ENCOUNTER — Ambulatory Visit (INDEPENDENT_AMBULATORY_CARE_PROVIDER_SITE_OTHER): Payer: BLUE CROSS/BLUE SHIELD | Admitting: Cardiology

## 2018-01-25 ENCOUNTER — Encounter: Payer: Self-pay | Admitting: Cardiology

## 2018-01-25 VITALS — BP 96/63 | HR 79 | Ht 73.0 in | Wt 335.0 lb

## 2018-01-25 DIAGNOSIS — I2510 Atherosclerotic heart disease of native coronary artery without angina pectoris: Secondary | ICD-10-CM | POA: Diagnosis not present

## 2018-01-25 DIAGNOSIS — I10 Essential (primary) hypertension: Secondary | ICD-10-CM

## 2018-01-25 NOTE — Patient Instructions (Addendum)

## 2018-01-25 NOTE — Progress Notes (Signed)
Clinical Summary Mr. Scheaffer is a 68 y.o.male seen today for follow up of the following medical problems.   1. Chest pain - seen 06/2017 in ER with chest pain - noncardiac in description. Constant for 7 days, worst with palpation - CT PE was negative. Trop neg x 2. EKG without acute ischemic changes  - 08/2017 nuclear stress orderded by pcp completed at [facility]: severe areas of reversibility in the apex, lateral wall, and inferior wall all comletely reversible.   - 08/2017 cath with 80% prox LAD lesion, s/p DES. - no recent chest pain. No recent SOB/DOE. His epigastric pain last visit has resolved since restarting protonix.  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     Past Medical History:  Diagnosis Date  . Cancer [facility])    prostate completed radiation 06/08/17  . Coronary artery disease    non obstructive  . Dyslipidemia   . GERD (gastroesophageal reflux disease)   . Hypertension   . Morbid obesity ([place])   . Sleep apnea    USES CPAP     No Known Allergies   Current Outpatient Medications  Medication Sig Dispense Refill  . amLODipine (NORVASC) 5 MG tablet Take 5 mg by mouth daily.    Marland Kitchen aspirin EC 81 MG tablet Take 81 mg by mouth daily.    Marland Kitchen atorvastatin (LIPITOR) 80 MG tablet Take 1 tablet (80 mg total) by mouth daily at 6 PM. 90 tablet 3  . clopidogrel (PLAVIX) 75 MG tablet Take 1 tablet (75 mg total) by mouth daily with breakfast. 90 tablet 3  . hydrochlorothiazide (HYDRODIURIL) 25 MG tablet Take 25 mg by mouth daily.    Marland Kitchen lisinopril (PRINIVIL,ZESTRIL) 20 MG tablet Take 40 mg by mouth daily.    . Menthol, Topical Analgesic, (ICY HOT EX) Apply 1 application topically at bedtime as needed (knee pain).    . metoprolol succinate (TOPROL-XL) 25 MG 24 hr tablet Take 1 tablet (25 mg total) by mouth daily. Take with or immediately following a meal. 90 tablet 3  .  nitroGLYCERIN (NITROSTAT) 0.4 MG SL tablet Place 1 tablet (0.4 mg total) under the tongue every 5 (five) minutes as needed for chest pain. 25 tablet 2   No current facility-administered medications for this visit.      Past Surgical History:  Procedure Laterality Date  . CARDIAC CATHETERIZATION     cath  . COLONOSCOPY    . CORONARY STENT INTERVENTION  09/01/2017  . CORONARY STENT INTERVENTION N/A 09/01/2017   Procedure: CORONARY STENT INTERVENTION;  Surgeon: Burnell Blanks, MD;  Location: [place] [facility];  Service: Cardiovascular;  Laterality: N/A;  . INTRAVASCULAR PRESSURE WIRE/FFR STUDY N/A 09/01/2017   Procedure: INTRAVASCULAR PRESSURE WIRE/FFR STUDY;  Surgeon: Burnell Blanks, MD;  Location: [place] [facility];  Service: Cardiovascular;  Laterality: N/A;  . LEFT HEART CATH AND CORONARY ANGIOGRAPHY N/A 09/01/2017   Procedure: LEFT HEART CATH AND CORONARY ANGIOGRAPHY;  Surgeon: Burnell Blanks, MD;  Location: [place] [facility];  Service: Cardiovascular;  Laterality: N/A;     No Known Allergies    Family History  Problem Relation Age of Onset  . Heart failure Father   . Heart disease Father      Social History Mr. Clowdus reports that he has never smoked. His smokeless tobacco use includes chew. Mr. Chevez reports no history of  alcohol use.   Review of Systems CONSTITUTIONAL: No weight loss, fever, chills, weakness or fatigue.  HEENT: Eyes: No visual loss, blurred vision, double vision or yellow sclerae.No hearing loss, sneezing, congestion, runny nose or sore throat.  SKIN: No rash or itching.  CARDIOVASCULAR: per hpi RESPIRATORY: No shortness of breath, cough or sputum.  GASTROINTESTINAL: No anorexia, nausea, vomiting or diarrhea. No abdominal pain or blood.  GENITOURINARY: No burning on urination, no polyuria NEUROLOGICAL: No headache, dizziness, syncope, paralysis, ataxia, numbness or tingling in the extremities. No change in bowel or bladder  control.  MUSCULOSKELETAL: +knee pain LYMPHATICS: No enlarged nodes. No history of splenectomy.  PSYCHIATRIC: No history of depression or anxiety.  ENDOCRINOLOGIC: No reports of sweating, cold or heat intolerance. No polyuria or polydipsia.  Marland Kitchen   Physical Examination Vitals:   01/25/18 1516  BP: 96/63  Pulse: 79  SpO2: 96%   Vitals:   01/25/18 1516  Weight: (!) 335 lb (152 kg)  Height: 6\' 1"  (1.854 m)    Gen: resting comfortably, no acute distress HEENT: no scleral icterus, pupils equal round and reactive, no palptable cervical adenopathy,  CV: RRR, no m/r/g, no jvd Resp: Clear to auscultation bilaterally GI: abdomen is soft, non-tender, non-distended, normal bowel sounds, no hepatosplenomegaly MSK: extremities are warm, no edema.  Skin: warm, no rash Neuro:  no focal deficits Psych: appropriate affect   Diagnostic Studies 08/2017 cath  Prox LAD lesion is 80% stenosed.  A drug-eluting stent was successfully placed using a STENT SIERRA 2.75 X 12 MM.  Post intervention, there is a 0% residual stenosis.  The left ventricular systolic function is normal.  LV end diastolic pressure is normal.  The left ventricular ejection fraction is 55-65% by visual estimate.  There is no mitral valve regurgitation.  1. Severe stenosis mid LAD. The stenosis was eccentric and in some views did not appear to be severe. FFR 0.8 in the mid LAD suggesting the stenosis was likely flow limiting. Given the abnormal FFR and the abnormal nuclear stress test, PCI was performed.  2. Successful PTCA/DES x 1 mid LAD 3. Normal LV systolic function  Post cath Recommendations: Will start a statin.  Recommend uninterrupted dual antiplatelet therapy with Aspirin 81mg  daily and Clopidogrel 75mg  dailyfor a minimum of 12 months (ACS - Class I recommendation).  06/2016 echo Study Conclusions  - Procedure narrative: Transthoracic echocardiography. Image quality was suboptimal with poor valvular  and endocardial visualization in several views. - Left ventricle: The cavity size was normal. Wall thickness was increased in a pattern of mild LVH. Systolic function was vigorous. The estimated ejection fraction was in the range of 65% to 70%. Images were inadequate for LV wall motion assessment. Doppler parameters are consistent with abnormal left ventricular relaxation (grade 1 diastolic dysfunction). - Aorta: Mild aortic root enlargement.    Assessment and Plan  1. CAD - s/p DES to LAD.  - no recent symptoms, continue current meds. Continue DAPT 1 year.   2. HTN - manual recheck 110/70 is at goal, continue current meds   F/u 6 months   Arnoldo Lenis, M.D

## 2018-06-16 DIAGNOSIS — M25562 Pain in left knee: Secondary | ICD-10-CM | POA: Diagnosis not present

## 2018-06-16 DIAGNOSIS — M25462 Effusion, left knee: Secondary | ICD-10-CM | POA: Diagnosis not present

## 2018-06-16 DIAGNOSIS — M1712 Unilateral primary osteoarthritis, left knee: Secondary | ICD-10-CM | POA: Diagnosis not present

## 2018-06-20 DIAGNOSIS — M549 Dorsalgia, unspecified: Secondary | ICD-10-CM | POA: Diagnosis not present

## 2018-06-20 DIAGNOSIS — M79606 Pain in leg, unspecified: Secondary | ICD-10-CM | POA: Diagnosis not present

## 2018-06-20 DIAGNOSIS — Z6841 Body Mass Index (BMI) 40.0 and over, adult: Secondary | ICD-10-CM | POA: Diagnosis not present

## 2018-06-20 DIAGNOSIS — I10 Essential (primary) hypertension: Secondary | ICD-10-CM | POA: Diagnosis not present

## 2018-06-20 DIAGNOSIS — Z299 Encounter for prophylactic measures, unspecified: Secondary | ICD-10-CM | POA: Diagnosis not present

## 2018-06-26 ENCOUNTER — Other Ambulatory Visit: Payer: Self-pay

## 2018-06-26 ENCOUNTER — Other Ambulatory Visit (HOSPITAL_COMMUNITY): Payer: Self-pay | Admitting: *Deleted

## 2018-06-26 ENCOUNTER — Other Ambulatory Visit: Payer: Self-pay | Admitting: *Deleted

## 2018-06-26 ENCOUNTER — Ambulatory Visit (HOSPITAL_COMMUNITY)
Admission: RE | Admit: 2018-06-26 | Discharge: 2018-06-26 | Disposition: A | Discharge status: Home / Self Care | Payer: Medicare HMO | Source: Ambulatory Visit | Attending: *Deleted | Admitting: *Deleted

## 2018-06-26 DIAGNOSIS — R102 Pelvic and perineal pain: Secondary | ICD-10-CM

## 2018-06-26 DIAGNOSIS — R69 Illness, unspecified: Secondary | ICD-10-CM | POA: Diagnosis not present

## 2018-06-26 DIAGNOSIS — R5381 Other malaise: Secondary | ICD-10-CM | POA: Diagnosis not present

## 2018-06-26 DIAGNOSIS — I10 Essential (primary) hypertension: Secondary | ICD-10-CM | POA: Diagnosis not present

## 2018-06-26 DIAGNOSIS — Z8546 Personal history of malignant neoplasm of prostate: Secondary | ICD-10-CM | POA: Diagnosis not present

## 2018-06-26 DIAGNOSIS — R222 Localized swelling, mass and lump, trunk: Secondary | ICD-10-CM | POA: Insufficient documentation

## 2018-06-26 DIAGNOSIS — E7800 Pure hypercholesterolemia, unspecified: Secondary | ICD-10-CM | POA: Diagnosis not present

## 2018-06-26 DIAGNOSIS — I2510 Atherosclerotic heart disease of native coronary artery without angina pectoris: Secondary | ICD-10-CM | POA: Diagnosis not present

## 2018-06-26 DIAGNOSIS — M1990 Unspecified osteoarthritis, unspecified site: Secondary | ICD-10-CM | POA: Diagnosis not present

## 2018-06-26 DIAGNOSIS — R52 Pain, unspecified: Secondary | ICD-10-CM | POA: Diagnosis not present

## 2018-06-26 DIAGNOSIS — Z923 Personal history of irradiation: Secondary | ICD-10-CM | POA: Diagnosis not present

## 2018-06-26 DIAGNOSIS — K644 Residual hemorrhoidal skin tags: Secondary | ICD-10-CM | POA: Diagnosis not present

## 2018-06-26 DIAGNOSIS — Z743 Need for continuous supervision: Secondary | ICD-10-CM | POA: Diagnosis not present

## 2018-06-26 DIAGNOSIS — Z6841 Body Mass Index (BMI) 40.0 and over, adult: Secondary | ICD-10-CM | POA: Diagnosis not present

## 2018-06-26 DIAGNOSIS — K610 Anal abscess: Secondary | ICD-10-CM | POA: Diagnosis not present

## 2018-06-26 DIAGNOSIS — K611 Rectal abscess: Secondary | ICD-10-CM | POA: Diagnosis not present

## 2018-06-26 MED ORDER — IOHEXOL 300 MG/ML  SOLN
100.0000 mL | Freq: Once | INTRAMUSCULAR | Status: AC | PRN
Start: 2018-06-26 — End: 2018-06-26
  Administered 2018-06-26: 19:00:00 100 mL via INTRAVENOUS

## 2018-07-01 DIAGNOSIS — L03818 Cellulitis of other sites: Secondary | ICD-10-CM | POA: Diagnosis not present

## 2018-07-01 DIAGNOSIS — K610 Anal abscess: Secondary | ICD-10-CM | POA: Diagnosis not present

## 2018-07-01 DIAGNOSIS — Z4801 Encounter for change or removal of surgical wound dressing: Secondary | ICD-10-CM | POA: Diagnosis not present

## 2018-07-01 DIAGNOSIS — Z79891 Long term (current) use of opiate analgesic: Secondary | ICD-10-CM | POA: Diagnosis not present

## 2018-07-01 DIAGNOSIS — Z7982 Long term (current) use of aspirin: Secondary | ICD-10-CM | POA: Diagnosis not present

## 2018-07-04 DIAGNOSIS — Z299 Encounter for prophylactic measures, unspecified: Secondary | ICD-10-CM | POA: Diagnosis not present

## 2018-07-04 DIAGNOSIS — K611 Rectal abscess: Secondary | ICD-10-CM | POA: Diagnosis not present

## 2018-07-04 DIAGNOSIS — Z7982 Long term (current) use of aspirin: Secondary | ICD-10-CM | POA: Diagnosis not present

## 2018-07-04 DIAGNOSIS — K610 Anal abscess: Secondary | ICD-10-CM | POA: Diagnosis not present

## 2018-07-04 DIAGNOSIS — Z79891 Long term (current) use of opiate analgesic: Secondary | ICD-10-CM | POA: Diagnosis not present

## 2018-07-04 DIAGNOSIS — C61 Malignant neoplasm of prostate: Secondary | ICD-10-CM | POA: Diagnosis not present

## 2018-07-04 DIAGNOSIS — Z4801 Encounter for change or removal of surgical wound dressing: Secondary | ICD-10-CM | POA: Diagnosis not present

## 2018-07-04 DIAGNOSIS — Z6841 Body Mass Index (BMI) 40.0 and over, adult: Secondary | ICD-10-CM | POA: Diagnosis not present

## 2018-07-04 DIAGNOSIS — I10 Essential (primary) hypertension: Secondary | ICD-10-CM | POA: Diagnosis not present

## 2018-07-04 DIAGNOSIS — L03818 Cellulitis of other sites: Secondary | ICD-10-CM | POA: Diagnosis not present

## 2018-07-06 DIAGNOSIS — K611 Rectal abscess: Secondary | ICD-10-CM | POA: Diagnosis not present

## 2018-07-07 DIAGNOSIS — Z7982 Long term (current) use of aspirin: Secondary | ICD-10-CM | POA: Diagnosis not present

## 2018-07-07 DIAGNOSIS — L03818 Cellulitis of other sites: Secondary | ICD-10-CM | POA: Diagnosis not present

## 2018-07-07 DIAGNOSIS — Z79891 Long term (current) use of opiate analgesic: Secondary | ICD-10-CM | POA: Diagnosis not present

## 2018-07-07 DIAGNOSIS — Z4801 Encounter for change or removal of surgical wound dressing: Secondary | ICD-10-CM | POA: Diagnosis not present

## 2018-07-07 DIAGNOSIS — K610 Anal abscess: Secondary | ICD-10-CM | POA: Diagnosis not present

## 2018-07-11 DIAGNOSIS — L03818 Cellulitis of other sites: Secondary | ICD-10-CM | POA: Diagnosis not present

## 2018-07-11 DIAGNOSIS — K610 Anal abscess: Secondary | ICD-10-CM | POA: Diagnosis not present

## 2018-07-11 DIAGNOSIS — Z79891 Long term (current) use of opiate analgesic: Secondary | ICD-10-CM | POA: Diagnosis not present

## 2018-07-11 DIAGNOSIS — Z4801 Encounter for change or removal of surgical wound dressing: Secondary | ICD-10-CM | POA: Diagnosis not present

## 2018-07-11 DIAGNOSIS — Z7982 Long term (current) use of aspirin: Secondary | ICD-10-CM | POA: Diagnosis not present

## 2018-07-13 DIAGNOSIS — K610 Anal abscess: Secondary | ICD-10-CM | POA: Diagnosis not present

## 2018-07-13 DIAGNOSIS — L03818 Cellulitis of other sites: Secondary | ICD-10-CM | POA: Diagnosis not present

## 2018-07-13 DIAGNOSIS — Z4801 Encounter for change or removal of surgical wound dressing: Secondary | ICD-10-CM | POA: Diagnosis not present

## 2018-07-13 DIAGNOSIS — Z79891 Long term (current) use of opiate analgesic: Secondary | ICD-10-CM | POA: Diagnosis not present

## 2018-07-13 DIAGNOSIS — Z7982 Long term (current) use of aspirin: Secondary | ICD-10-CM | POA: Diagnosis not present

## 2018-07-14 ENCOUNTER — Ambulatory Visit (HOSPITAL_COMMUNITY): Payer: BLUE CROSS/BLUE SHIELD

## 2018-07-14 DIAGNOSIS — Z4801 Encounter for change or removal of surgical wound dressing: Secondary | ICD-10-CM | POA: Diagnosis not present

## 2018-07-14 DIAGNOSIS — K611 Rectal abscess: Secondary | ICD-10-CM | POA: Diagnosis not present

## 2018-07-18 DIAGNOSIS — Z4801 Encounter for change or removal of surgical wound dressing: Secondary | ICD-10-CM | POA: Diagnosis not present

## 2018-07-18 DIAGNOSIS — Z79891 Long term (current) use of opiate analgesic: Secondary | ICD-10-CM | POA: Diagnosis not present

## 2018-07-18 DIAGNOSIS — L03818 Cellulitis of other sites: Secondary | ICD-10-CM | POA: Diagnosis not present

## 2018-07-18 DIAGNOSIS — K610 Anal abscess: Secondary | ICD-10-CM | POA: Diagnosis not present

## 2018-07-18 DIAGNOSIS — Z7982 Long term (current) use of aspirin: Secondary | ICD-10-CM | POA: Diagnosis not present

## 2018-07-25 DIAGNOSIS — Z789 Other specified health status: Secondary | ICD-10-CM | POA: Diagnosis not present

## 2018-07-25 DIAGNOSIS — I10 Essential (primary) hypertension: Secondary | ICD-10-CM | POA: Diagnosis not present

## 2018-07-25 DIAGNOSIS — Z299 Encounter for prophylactic measures, unspecified: Secondary | ICD-10-CM | POA: Diagnosis not present

## 2018-07-25 DIAGNOSIS — M25562 Pain in left knee: Secondary | ICD-10-CM | POA: Diagnosis not present

## 2018-07-25 DIAGNOSIS — M2550 Pain in unspecified joint: Secondary | ICD-10-CM | POA: Diagnosis not present

## 2018-07-25 DIAGNOSIS — G4733 Obstructive sleep apnea (adult) (pediatric): Secondary | ICD-10-CM | POA: Diagnosis not present

## 2018-07-25 DIAGNOSIS — Z6841 Body Mass Index (BMI) 40.0 and over, adult: Secondary | ICD-10-CM | POA: Diagnosis not present

## 2018-08-03 DIAGNOSIS — K611 Rectal abscess: Secondary | ICD-10-CM | POA: Diagnosis not present

## 2018-08-03 DIAGNOSIS — Z299 Encounter for prophylactic measures, unspecified: Secondary | ICD-10-CM | POA: Diagnosis not present

## 2018-08-03 DIAGNOSIS — Z6841 Body Mass Index (BMI) 40.0 and over, adult: Secondary | ICD-10-CM | POA: Diagnosis not present

## 2018-08-03 DIAGNOSIS — I10 Essential (primary) hypertension: Secondary | ICD-10-CM | POA: Diagnosis not present

## 2018-08-03 DIAGNOSIS — M2550 Pain in unspecified joint: Secondary | ICD-10-CM | POA: Diagnosis not present

## 2018-08-03 DIAGNOSIS — R69 Illness, unspecified: Secondary | ICD-10-CM | POA: Diagnosis not present

## 2018-08-04 DIAGNOSIS — M25562 Pain in left knee: Secondary | ICD-10-CM | POA: Diagnosis not present

## 2018-08-04 DIAGNOSIS — Z299 Encounter for prophylactic measures, unspecified: Secondary | ICD-10-CM | POA: Diagnosis not present

## 2018-08-04 DIAGNOSIS — Z6841 Body Mass Index (BMI) 40.0 and over, adult: Secondary | ICD-10-CM | POA: Diagnosis not present

## 2018-08-11 DIAGNOSIS — E669 Obesity, unspecified: Secondary | ICD-10-CM | POA: Diagnosis not present

## 2018-08-11 DIAGNOSIS — K644 Residual hemorrhoidal skin tags: Secondary | ICD-10-CM | POA: Diagnosis not present

## 2018-08-11 DIAGNOSIS — K611 Rectal abscess: Secondary | ICD-10-CM | POA: Diagnosis not present

## 2018-08-22 DIAGNOSIS — R69 Illness, unspecified: Secondary | ICD-10-CM | POA: Diagnosis not present

## 2018-08-25 DIAGNOSIS — M1712 Unilateral primary osteoarthritis, left knee: Secondary | ICD-10-CM | POA: Diagnosis not present

## 2018-08-29 DIAGNOSIS — E7800 Pure hypercholesterolemia, unspecified: Secondary | ICD-10-CM | POA: Diagnosis not present

## 2018-08-29 DIAGNOSIS — K921 Melena: Secondary | ICD-10-CM | POA: Diagnosis not present

## 2018-08-29 DIAGNOSIS — Z299 Encounter for prophylactic measures, unspecified: Secondary | ICD-10-CM | POA: Diagnosis not present

## 2018-08-29 DIAGNOSIS — R197 Diarrhea, unspecified: Secondary | ICD-10-CM | POA: Diagnosis not present

## 2018-08-29 DIAGNOSIS — Z6841 Body Mass Index (BMI) 40.0 and over, adult: Secondary | ICD-10-CM | POA: Diagnosis not present

## 2018-08-29 DIAGNOSIS — I10 Essential (primary) hypertension: Secondary | ICD-10-CM | POA: Diagnosis not present

## 2018-09-01 DIAGNOSIS — M170 Bilateral primary osteoarthritis of knee: Secondary | ICD-10-CM | POA: Diagnosis not present

## 2018-09-08 DIAGNOSIS — M170 Bilateral primary osteoarthritis of knee: Secondary | ICD-10-CM | POA: Diagnosis not present

## 2018-09-14 ENCOUNTER — Encounter (INDEPENDENT_AMBULATORY_CARE_PROVIDER_SITE_OTHER): Payer: Self-pay | Admitting: Nurse Practitioner

## 2018-09-14 ENCOUNTER — Ambulatory Visit (INDEPENDENT_AMBULATORY_CARE_PROVIDER_SITE_OTHER): Payer: Medicare HMO | Admitting: Nurse Practitioner

## 2018-09-14 ENCOUNTER — Other Ambulatory Visit: Payer: Self-pay

## 2018-09-14 VITALS — BP 128/84 | HR 91 | Temp 98.6°F | Resp 18 | Ht 74.0 in | Wt 335.1 lb

## 2018-09-14 DIAGNOSIS — R1030 Lower abdominal pain, unspecified: Secondary | ICD-10-CM | POA: Insufficient documentation

## 2018-09-14 DIAGNOSIS — K625 Hemorrhage of anus and rectum: Secondary | ICD-10-CM

## 2018-09-14 NOTE — Patient Instructions (Signed)
1. Complete the provided lab order  2. Schedule an abdominal/pelvic CAT scan, the blood tests must be done before you proceed with your CAT scan  3. Colace stool softener 2  Capsules daily  4. Desitin cream for hemorrhoidal irritation and bleeding   5. Go to the local emergency room if you develop severe abdominal pain  6. Schedule a consult with your cardiologist for a cardiac clearance to include Plavix and Aspirin instructions prior to scheduling a colonoscopy. Please call me with name of your cardiologist and date of your appointment

## 2018-09-14 NOTE — Progress Notes (Signed)
Subjective:    Patient ID: Phillip Rios, male    DOB: February 02, 1950, 68 y.o.   MRN: [id number]  HPI [name] is a 68 year old male with a past medical history significant for prostate cancer status post radiation and seed implantation 07/2017, OSA uses cpap, hypertension, coronary artery disease, status post coronary stent placement 08/2017 on aspirin and Plavix, status post I&D of perirectal abscess 06/2018, stomach/intestinal surgery as an infant at the end at the age of 7 due to an obstruction.  He presents today for further evaluation regarding abdominal pain, abdominal bloat and rectal bleeding.  He complains of lower abdominal pain with associated bloat that comes and goes.  He currently rates his pain as a 3 or 4 on a scale 1-10.  He complains of bright red rectal bleeding which initially occurred after straining to pass a bowel movement about 3 weeks ago.  He sees of bright red blood in the toilet water as well as on the toilet tissue.  No associated rectal pain.  He started taking Colace 100 mg 2 capsules 1 or 2 weeks ago and he is no longer straining but he continues to see rectal bleeding with each bowel movement.  He tried Metamucil which resulted in diarrhea.  He typically passes a soft formed brown stool 5-6 times daily usually after he eats.  He reports this is his typical bowel pattern.  No watery diarrhea or melena.  History of GERD which is well controlled on Protonix 40 mg once daily. He denies having any dysphasia, heartburn or stomach pain.  He underwent a colonoscopy 06/13/2014 by Dr. Karlyn Agee which identified a semi-pedunculated polyp to the rectum, the colon was redundant, the clonus the colonoscope to the ascending colon and no further.  A barium enema was ordered, however, the patient did not complete this test.  No known family history of colorectal cancer.  His wife is present.  Past Medical History:  Diagnosis Date  . Cancer [facility])    prostate completed  radiation 06/08/17  . Coronary artery disease    non obstructive  . Dyslipidemia   . GERD (gastroesophageal reflux disease)   . Hypertension   . Morbid obesity ([place])   . Sleep apnea    USES CPAP   Past Surgical History:  Procedure Laterality Date  . CARDIAC CATHETERIZATION     cath  . COLONOSCOPY    . CORONARY STENT INTERVENTION  09/01/2017  . CORONARY STENT INTERVENTION N/A 09/01/2017   Procedure: CORONARY STENT INTERVENTION;  Surgeon: Burnell Blanks, MD;  Location: [place] [facility];  Service: Cardiovascular;  Laterality: N/A;  . INTRAVASCULAR PRESSURE WIRE/FFR STUDY N/A 09/01/2017   Procedure: INTRAVASCULAR PRESSURE WIRE/FFR STUDY;  Surgeon: Burnell Blanks, MD;  Location: [place] [facility];  Service: Cardiovascular;  Laterality: N/A;  . LEFT HEART CATH AND CORONARY ANGIOGRAPHY N/A 09/01/2017   Procedure: LEFT HEART CATH AND CORONARY ANGIOGRAPHY;  Surgeon: Burnell Blanks, MD;  Location: [place] [facility];  Service: Cardiovascular;  Laterality: N/A;   Current Outpatient Medications on File Prior to Visit  Medication Sig Dispense Refill  . acetaminophen (TYLENOL) 500 MG tablet Take 500 mg by mouth every 6 (six) hours as needed for moderate pain or headache.    Marland Kitchen amLODipine (NORVASC) 5 MG tablet Take 5 mg by mouth daily.    Marland Kitchen aspirin EC 81 MG tablet Take 81 mg by mouth daily.    Marland Kitchen atorvastatin (LIPITOR) 80 MG tablet Take 1  tablet (80 mg total) by mouth daily at 6 PM. 90 tablet 3  . clopidogrel (PLAVIX) 75 MG tablet Take 1 tablet (75 mg total) by mouth daily with breakfast. 90 tablet 3  . docusate sodium (COLACE) 100 MG capsule Take 200 mg by mouth daily.    . hydrochlorothiazide (HYDRODIURIL) 25 MG tablet Take 25 mg by mouth daily.    Marland Kitchen lisinopril (PRINIVIL,ZESTRIL) 20 MG tablet Take 40 mg by mouth daily.    . metoprolol succinate (TOPROL-XL) 25 MG 24 hr tablet Take 1 tablet (25 mg total) by mouth daily. Take with or immediately following a meal. 90 tablet 3   . nitroGLYCERIN (NITROSTAT) 0.4 MG SL tablet Place 1 tablet (0.4 mg total) under the tongue every 5 (five) minutes as needed for chest pain. 25 tablet 2  . pantoprazole (PROTONIX) 40 MG tablet Take 40 mg by mouth daily.  2   No current facility-administered medications on file prior to visit.    No Known Allergies  Family History  Problem Relation Age of Onset  . Heart failure Father   . Heart disease Father    Social History: he is married, denies tobacco history, no alcohol use  Review of Systems see HPI, all other systems reviewed and are negative    Objective:   Physical Exam BP 128/84   Pulse 91   Temp 98.6 F (37 C) (Oral)   Resp 18   Ht 6\' 2"  (1.88 m)   Wt (!) 335 lb 1.6 oz (152 kg)   BMI 43.02 kg/m  General: 68 year old obese white male ambulating with a limp but steady gait with a cane in no acute distress Eyes: Sclera nonicteric, conjunctiva pink Neck: Supple, no lymphadenopathy or thyromegaly Heart: Regular rate and rhythm, no murmurs Lungs: Breath sounds clear throughout  Abdomen: Soft, nondistended, moderate tenderness throughout the lower abdomen without rebound or guarding, 2 vertical scars to the right and left of the mid abdomen intact Rectal: Patient declined exam, s/p I & D perirectal abscess 06/2018 Extremities: No edema Neuro: Alert and oriented x3, no focal deficits    Assessment & Plan:   62.  68 year old male with a history of prostate cancer with past radiation presents with lower abdominal pain and hematochezia. Colonoscopy in 2016 incomplete past the ascending colon. -CBC, CMP and CRP -Abdominal/pelvic CAT scan with oral and IV contrast, BUN/creatinine to be reviewed prior to the patient receiving IV contrast -Diagnostic colonoscopy to rule out radiation proctitis, colon polyps, colon malignancy. Colonoscopy will be scheduled after cardiac clearance obtained (patient last saw his cardiologist 1 year ago) -Advised to go to the local emergency room if  he develops severe abdominal pain or worsening rectal bleeding  2. stable GERD on Protonix 40 mg once daily  3. History of coronary artery disease, status post stent placement 08/2017 on Plavix and aspirin -Patient will schedule consult with his cardiologist as he is past due for cardiac follow-up, cardiac clearance requested prior to proceeding with a colonoscopy

## 2018-09-15 LAB — CBC WITH DIFFERENTIAL/PLATELET
Absolute Monocytes: 561 cells/uL (ref 200–950)
Basophils Absolute: 19 cells/uL (ref 0–200)
Basophils Relative: 0.3 %
Eosinophils Absolute: 221 cells/uL (ref 15–500)
Eosinophils Relative: 3.5 %
HCT: 36.8 % — ABNORMAL LOW (ref 38.5–50.0)
Hemoglobin: 12 g/dL — ABNORMAL LOW (ref 13.2–17.1)
Lymphs Abs: 1084 cells/uL (ref 850–3900)
MCH: 27.8 pg (ref 27.0–33.0)
MCHC: 32.6 g/dL (ref 32.0–36.0)
MCV: 85.2 fL (ref 80.0–100.0)
MPV: 9.5 fL (ref 7.5–12.5)
Monocytes Relative: 8.9 %
Neutro Abs: 4416 cells/uL (ref 1500–7800)
Neutrophils Relative %: 70.1 %
Platelets: 268 10*3/uL (ref 140–400)
RBC: 4.32 10*6/uL (ref 4.20–5.80)
RDW: 14.8 % (ref 11.0–15.0)
Total Lymphocyte: 17.2 %
WBC: 6.3 10*3/uL (ref 3.8–10.8)

## 2018-09-15 LAB — COMPLETE METABOLIC PANEL WITH GFR
AG Ratio: 2 (calc) (ref 1.0–2.5)
ALT: 29 U/L (ref 9–46)
AST: 21 U/L (ref 10–35)
Albumin: 4.5 g/dL (ref 3.6–5.1)
Alkaline phosphatase (APISO): 68 U/L (ref 35–144)
BUN/Creatinine Ratio: 13 (calc) (ref 6–22)
BUN: 17 mg/dL (ref 7–25)
CO2: 27 mmol/L (ref 20–32)
Calcium: 9.7 mg/dL (ref 8.6–10.3)
Chloride: 101 mmol/L (ref 98–110)
Creat: 1.26 mg/dL — ABNORMAL HIGH (ref 0.70–1.25)
GFR, Est African American: 69 mL/min/{1.73_m2} (ref >=60)
GFR, Est Non African American: 59 mL/min/{1.73_m2} — ABNORMAL LOW (ref >=60)
Globulin: 2.2 g/dL (calc) (ref 1.9–3.7)
Glucose, Bld: 114 mg/dL (ref 65–139)
Potassium: 3.8 mmol/L (ref 3.5–5.3)
Sodium: 138 mmol/L (ref 135–146)
Total Bilirubin: 0.5 mg/dL (ref 0.2–1.2)
Total Protein: 6.7 g/dL (ref 6.1–8.1)

## 2018-09-15 LAB — C-REACTIVE PROTEIN: CRP: 1.6 mg/L (ref <8.0)

## 2018-09-18 ENCOUNTER — Other Ambulatory Visit: Payer: Self-pay

## 2018-09-18 MED ORDER — ATORVASTATIN CALCIUM 80 MG PO TABS: 80.0000 mg | ORAL_TABLET | Freq: Every day | ORAL | 3 refills | Status: DC

## 2018-09-22 DIAGNOSIS — Z299 Encounter for prophylactic measures, unspecified: Secondary | ICD-10-CM | POA: Diagnosis not present

## 2018-09-22 DIAGNOSIS — Z6841 Body Mass Index (BMI) 40.0 and over, adult: Secondary | ICD-10-CM | POA: Diagnosis not present

## 2018-09-22 DIAGNOSIS — Z713 Dietary counseling and surveillance: Secondary | ICD-10-CM | POA: Diagnosis not present

## 2018-09-22 DIAGNOSIS — I10 Essential (primary) hypertension: Secondary | ICD-10-CM | POA: Diagnosis not present

## 2018-09-22 DIAGNOSIS — K611 Rectal abscess: Secondary | ICD-10-CM | POA: Diagnosis not present

## 2018-09-29 ENCOUNTER — Ambulatory Visit (HOSPITAL_COMMUNITY): Payer: Medicare HMO

## 2018-10-03 ENCOUNTER — Other Ambulatory Visit: Payer: Self-pay

## 2018-10-03 DIAGNOSIS — R6889 Other general symptoms and signs: Secondary | ICD-10-CM | POA: Diagnosis not present

## 2018-10-03 DIAGNOSIS — Z20822 Contact with and (suspected) exposure to covid-19: Secondary | ICD-10-CM

## 2018-10-05 LAB — NOVEL CORONAVIRUS, NAA: SARS-CoV-2, NAA: NOT DETECTED

## 2018-10-06 DIAGNOSIS — Z299 Encounter for prophylactic measures, unspecified: Secondary | ICD-10-CM | POA: Diagnosis not present

## 2018-10-06 DIAGNOSIS — I10 Essential (primary) hypertension: Secondary | ICD-10-CM | POA: Diagnosis not present

## 2018-10-06 DIAGNOSIS — Z6841 Body Mass Index (BMI) 40.0 and over, adult: Secondary | ICD-10-CM | POA: Diagnosis not present

## 2018-10-06 DIAGNOSIS — K611 Rectal abscess: Secondary | ICD-10-CM | POA: Diagnosis not present

## 2018-10-06 DIAGNOSIS — G4733 Obstructive sleep apnea (adult) (pediatric): Secondary | ICD-10-CM | POA: Diagnosis not present

## 2018-10-13 ENCOUNTER — Other Ambulatory Visit: Payer: Self-pay

## 2018-10-13 ENCOUNTER — Ambulatory Visit (HOSPITAL_COMMUNITY)
Admission: RE | Admit: 2018-10-13 | Discharge: 2018-10-13 | Disposition: A | Discharge status: Home / Self Care | Payer: Medicare HMO | Source: Ambulatory Visit | Attending: Nurse Practitioner | Admitting: Nurse Practitioner

## 2018-10-13 DIAGNOSIS — K625 Hemorrhage of anus and rectum: Secondary | ICD-10-CM | POA: Insufficient documentation

## 2018-10-13 DIAGNOSIS — R1030 Lower abdominal pain, unspecified: Secondary | ICD-10-CM | POA: Diagnosis present

## 2018-10-13 DIAGNOSIS — R109 Unspecified abdominal pain: Secondary | ICD-10-CM | POA: Diagnosis not present

## 2018-10-13 MED ORDER — IOHEXOL 300 MG/ML  SOLN
100.0000 mL | Freq: Once | INTRAMUSCULAR | Status: AC | PRN
  Administered 2018-10-13: 18:00:00 100 mL via INTRAVENOUS

## 2018-10-26 DIAGNOSIS — Z299 Encounter for prophylactic measures, unspecified: Secondary | ICD-10-CM | POA: Diagnosis not present

## 2018-10-26 DIAGNOSIS — K611 Rectal abscess: Secondary | ICD-10-CM | POA: Diagnosis not present

## 2018-10-26 DIAGNOSIS — I10 Essential (primary) hypertension: Secondary | ICD-10-CM | POA: Diagnosis not present

## 2018-10-26 DIAGNOSIS — Z6841 Body Mass Index (BMI) 40.0 and over, adult: Secondary | ICD-10-CM | POA: Diagnosis not present

## 2018-10-31 ENCOUNTER — Ambulatory Visit: Payer: Medicare HMO | Admitting: General Surgery

## 2018-10-31 ENCOUNTER — Other Ambulatory Visit: Payer: Self-pay

## 2018-10-31 ENCOUNTER — Encounter: Payer: Self-pay | Admitting: General Surgery

## 2018-10-31 VITALS — BP 113/76 | HR 88 | Temp 96.4°F | Resp 20 | Ht 73.0 in | Wt 329.0 lb

## 2018-10-31 DIAGNOSIS — K6289 Other specified diseases of anus and rectum: Secondary | ICD-10-CM

## 2018-10-31 DIAGNOSIS — K625 Hemorrhage of anus and rectum: Secondary | ICD-10-CM | POA: Diagnosis not present

## 2018-10-31 DIAGNOSIS — K603 Anal fistula: Secondary | ICD-10-CM | POA: Diagnosis not present

## 2018-10-31 NOTE — Patient Instructions (Signed)
Hold Plavix starting Friday, and make sure Dr. Harl Bowie is ok with you undergoing anesthesia.   Continue to hold the Plavix for your surgery. Use the Recticare cream as needed to help with burning pain.   Anal Fistula  An anal fistula is a hole that develops between the bowel and the skin near the anus. The anus allows stool (feces) to leave the body. The anus has many tiny glands that make lubricating fluid. Sometimes, these glands become plugged and infected. This can cause a fluid-filled pocket (abscess) to form. An anal fistula often occurs when an abscess becomes infected and then develops into a hole between the bowel and the skin. What are the causes? In most cases, an anal fistula is caused by a past or current buildup of pus around the anus (anal abscess). Other causes include:  A complication of surgery.  Injury to the rectum or the area around it.  Using high-energy beams (radiation) to treat the area around the rectum. What increases the risk? You are more likely to develop this condition if you have certain medical conditions or diseases, including:  Chronic inflammatory bowel disease, such as Crohn's disease or ulcerative colitis.  Colon cancer or rectal cancer.  Diverticular disease, such as diverticulitis.  A sexually transmitted infection, or STI, such as gonorrhea, chlamydia, or syphilis.  An infection that is caused by HIV. What are the signs or symptoms? Symptoms of this condition include:  Throbbing or constant pain that may be worse while you are sitting.  Swelling or irritation around the anus.  Pus or blood from an opening near the anus.  Pain when passing stool.  Fever or chills. How is this diagnosed? This condition is diagnosed based on:  A physical exam. This may include: ? An exam to find the external opening of the fistula. ? An exam with a probe or scope to help locate the internal opening of the fistula. ? An exam of the rectum with a gloved  hand (digital rectal exam).  Imaging tests that use dye to find the exact location and path of the fistula. Tests may include: ? X-rays. ? Ultrasound. ? CT scan. ? MRI.  Other tests to find the cause of the anal fistula. How is this treated? This condition is most commonly treated with surgery. The type of surgery that is used will depend on where the fistula is located and how complex the fistula is. Surgery may include:  A fistulotomy. The whole fistula is opened up, and the contents are drained to promote healing.  Seton placement. A silk string (seton) is placed into the fistula during a fistulotomy. This helps to drain any infection and promote healing.  Advancement flap procedure. Tissue is removed from your rectum or the skin around the anus and attached to the opening of the fistula.  Bioprosthetic plug. A cone-shaped plug is made from your tissue and is used to block the opening of the fistula. Some anal fistulas do not require surgery. A nonsurgical treatment option involves injecting a fibrin glue to seal the fistula. You also may be prescribed an antibiotic medicine to treat any infection. Follow these instructions at home: Medicines  Take over-the-counter and prescription medicines only as told by your health care provider.  If you were prescribed an antibiotic medicine, take it as told by your health care provider. Do not stop taking the antibiotic even if you start to feel better.  Use a stool softener or a laxative if told to do  so by your health care provider. General instructions   Eat a high-fiber diet as told by your health care provider. This can help to prevent constipation.  Drink enough fluid to keep your urine pale yellow.  Take a warm sitz bath for 15-20 minutes, 3-4 times per day, or as told by your health care provider. Sitz baths can ease your pain and discomfort and help with healing.  Follow good hygiene to keep the anal area as clean and dry as  possible. Use wet toilet paper or a moist towelette after each bowel movement.  Keep all follow-up visits as told by your health care provider. This is important. Contact a health care provider if you have:  Increased pain that is not controlled with medicines.  New redness or swelling around the anal area.  New fluid, blood, or pus coming from the anal area.  Tenderness or warmth around the anal area. Get help right away if you have:  A fever.  Severe pain.  Chills or diarrhea.  Severe problems urinating or having a bowel movement. Summary  An anal fistula is a hole that develops between the bowel and the skin near the anus.  This condition is most often caused by a buildup of pus around the anus (anal abscess). Other causes include a complication of surgery, an injury to the rectum, or the use of radiation to treat the rectal area.  This condition is most commonly treated with surgery.  Follow your health care provider's instructions about taking medicines, eating and drinking, or taking sitz baths.  Call your health care provider if you have more pain, swelling, or blood. Get help right away if you have fever, severe pain, or problems passing urine or stool. This information is not intended to replace advice given to you by your health care provider. Make sure you discuss any questions you have with your health care provider. Document Released: 12/18/2007 Document Revised: 05/20/2017 Document Reviewed: 05/20/2017 Elsevier Patient Education  Licking fistulotomy is a surgical procedure to open and drain an anal fistula. An anal fistula is an abnormal tunnel that develops between the bowel and the skin near the outside of the anus, where stool (feces) comes out. During this procedure, the fistula is opened up and the contents are drained to promote healing. Tell a health care provider about:  Any allergies you have.  All medicines you are  taking, including vitamins, herbs, eye drops, creams, and over-the-counter medicines.  Any problems you or family members have had with anesthetic medicines.  Any blood disorders you have.  Any surgeries you have had.  Any medical conditions you have.  Whether you are pregnant or may be pregnant. What are the risks? Generally, this is a safe procedure. However, problems may occur, including:  Infection.  Bleeding.  Allergic reactions to medicines or dyes.  Damage to other structures or organs.  Not being able to control when you have bowel movements (incontinence).  Being unable to empty your bladder (urinary retention). Medicines  Ask your health care provider about: ? Changing or stopping your regular medicines. This is especially important if you are taking diabetes medicines or blood thinners. ? Taking medicines such as aspirin and ibuprofen. These medicines can thin your blood. Do not take these medicines before your procedure if your health care provider instructs you not to.  You may be given antibiotic medicine to help prevent infection. General Instructions  You may have an exam  or testing.  You may have a blood or urine sample taken.  You may be instructed to take a laxative or use an enema to clean your bowels before surgery.  Plan to have someone take you home from the hospital or clinic.  If you will be going home right after the procedure, plan to have someone with you for 24 hours. What happens during the procedure?  To reduce your risk of infection: ? Your health care team will wash or sanitize their hands. ? Your skin will be washed with soap.  An IV tube will be inserted into one of your veins to give you fluids and medicines.  You will be given one or more of the following: ? A medicine to help you relax (sedative). ? A medicine to numb the area (local anesthetic). ? A medicine to make you fall asleep (general anesthetic).  Your surgeon will  locate the internal opening of your fistula.  An incision will be made in the fistula opening. The incision may extend into the muscles around the anus (sphincter muscles).  The fistula will be cut open and drained.  The sides of the fistula may be stitched (sutured) to the sides of the incision.  Gauze bandages (dressings) may be placed inside of the fistula. The procedure may vary among health care providers and hospitals. What happens after the procedure?  Your blood pressure, heart rate, breathing rate, and blood oxygen level will be monitored until the medicines you were given have worn off.  You may have to wear compression stockings. These stockings help to prevent blood clots and reduce swelling in your legs.  Do not drive for 24 hours if you received a sedative.  You may continue to receive fluids and medicines through an IV tube.  You may have some bleeding from the surgical area. You may have to wear an absorbent pad. This information is not intended to replace advice given to you by your health care provider. Make sure you discuss any questions you have with your health care provider. Document Released: 04/29/2015 Document Revised: 12/17/2016 Document Reviewed: 04/29/2015 Elsevier Patient Education  2020 [employer].

## 2018-10-31 NOTE — Progress Notes (Signed)
[name] History and Physical  Reason for Referral: Rectal abscess?  Referring Physician: Monico Blitz, MD   Chief Complaint    Rectal Problems      Phillip Rios is a 68 y.o. male.  HPI: Phillip Rios is a 68 yo with reported anal pain and pressure for several months now. He says that he has a history of prostate cancer s/p radiation, and received this treatment June/July 2019. He says that since the pandemic he has not followed up with his Urologist, Dr. Mar Daring since that time. He has complaints of rectal pain and burning sensation constantly, and says he feels like he can never get clean. He has been also having some hematochezia and is getting scheduled for a colonoscopy with Dr. Laural Golden, but is getting risk stratified for this procedure on Friday with Dr. Harl Bowie.  He reports that in June 2020 he went to [facility] to [facility] and Dr. Ladona Horns took him for an I&D of a perirectal abscess. Their CT was down and he had a CT at [facility] at that time. This CT actually demonstrated a phlegmon like area but no obvious fluid.  He says that he had an I&D and his wife had to pack the area.  He denies ever hearing any concern for fistula formation. He denies any prior rectal abscess or drainage. He reports that his last colonoscopy was in 2016 and was incomplete with Dr. Ladona Horns. He was told to come back the next day, and then told it would have to be rescheduled, and the patient never followed up.  He has been on antibiotics for several weeks now.  He says that he has had a few months of bloody BMs. He has not had a rectal exam recently as he has refused. He has lost a few lbs in the last few months due to not wanting to eat. He saw Dr. Olevia Perches PA [facility] and noted lower abdominal pain with this bloody BMs, and a CT was performed 09/2018 that demonstrated no rectal abscess or concern for diverticulitis.   He reports a history of surgery as a baby and child for an "obstruction"  but is unsure what his pathology was or what was done. 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 He had a cardiac cath and stent placed in 08/2017 and has been on Plavix.  He denies any family or personal history of Crohn's disease or prior fistulas.   Past Medical History:  Diagnosis Date  . Cancer [facility])    prostate completed radiation 06/08/17  . Coronary artery disease    non obstructive  . Dyslipidemia   . GERD (gastroesophageal reflux disease)   . Hypertension   . Morbid obesity ([place])   . Sleep apnea    USES CPAP    Past Surgical History:  Procedure Laterality Date  . CARDIAC CATHETERIZATION     cath  . COLONOSCOPY    . CORONARY STENT INTERVENTION  09/01/2017  . CORONARY STENT INTERVENTION N/A 09/01/2017   Procedure: CORONARY STENT INTERVENTION;  Surgeon: Burnell Blanks, MD;  Location: [facility];  Service: Cardiovascular;  Laterality: N/A;  . INTRAVASCULAR PRESSURE WIRE/FFR STUDY N/A 09/01/2017   Procedure: INTRAVASCULAR PRESSURE WIRE/FFR STUDY;  Surgeon: Burnell Blanks, MD;  Location: [facility];  Service: Cardiovascular;  Laterality: N/A;  . LEFT HEART CATH AND CORONARY ANGIOGRAPHY N/A 09/01/2017   Procedure: LEFT HEART CATH AND CORONARY ANGIOGRAPHY;  Surgeon: Burnell Blanks, MD;  Location: [facility]  INVASIVE CV LAB;  Service: Cardiovascular;  Laterality: N/A;    Family History  Problem Relation Age of Onset  . Heart failure Father   . Heart disease Father     Social History   Tobacco Use  . Smoking status: Never Smoker  . Smokeless tobacco: Current User    Types: Chew  Substance Use Topics  . Alcohol use: No  . Drug use: No    Medications: I have reviewed the patient's current medications. Allergies as of 10/31/2018   No Known Allergies     Medication List       Accurate as of October 31, 2018  9:10 AM. If you have any questions, ask your nurse or doctor.        acetaminophen 500 MG tablet Commonly known  as: TYLENOL Take 500 mg by mouth every 6 (six) hours as needed for moderate pain or headache.   amLODipine 5 MG tablet Commonly known as: NORVASC Take 5 mg by mouth daily.   aspirin EC 81 MG tablet Take 81 mg by mouth daily.   atorvastatin 80 MG tablet Commonly known as: LIPITOR Take 1 tablet (80 mg total) by mouth daily at 6 PM.   clopidogrel 75 MG tablet Commonly known as: PLAVIX Take 1 tablet (75 mg total) by mouth daily with breakfast.   docusate sodium 100 MG capsule Commonly known as: COLACE Take 200 mg by mouth daily.   doxycycline 100 MG tablet Commonly known as: VIBRA-TABS Take 100 mg by mouth 2 (two) times daily.   hydrochlorothiazide 25 MG tablet Commonly known as: HYDRODIURIL Take 25 mg by mouth daily.   HYDROcodone-acetaminophen 5-325 MG tablet Commonly known as: NORCO/VICODIN Take 1 tablet by mouth 2 (two) times daily.   lisinopril 20 MG tablet Commonly known as: ZESTRIL Take 40 mg by mouth daily.   metoprolol succinate 25 MG 24 hr tablet Commonly known as: TOPROL-XL Take 1 tablet (25 mg total) by mouth daily. Take with or immediately following a meal.   nitroGLYCERIN 0.4 MG SL tablet Commonly known as: NITROSTAT Place 1 tablet (0.4 mg total) under the tongue every 5 (five) minutes as needed for chest pain.   pantoprazole 40 MG tablet Commonly known as: PROTONIX Take 40 mg by mouth daily.   tamsulosin 0.4 MG Caps capsule Commonly known as: FLOMAX Take 0.4 mg by mouth daily.        ROS:  A comprehensive review of systems was negative except for: Respiratory: positive for SOB Gastrointestinal: positive for abdominal pain and rectal pain, bloody BMs Genitourinary: positive for frequency Musculoskeletal: positive for back pain and stiff joints Endocrine: positive for tired/ sluggish  Blood pressure 113/76, pulse 88, temperature (!) 96.4 F (35.8 C), temperature source Tympanic, resp. rate 20, height 6\' 1"  (1.854 m), weight (!) 329 lb (149.2  kg), SpO2 92 %. Physical Exam Vitals signs reviewed.  Constitutional:      Appearance: He is obese.  HENT:     Head: Normocephalic and atraumatic.     Nose: Nose normal.     Mouth/Throat:     Mouth: Mucous membranes are moist.  Eyes:     Extraocular Movements: Extraocular movements intact.     Pupils: Pupils are equal, round, and reactive to light.  Neck:     Musculoskeletal: Normal range of motion.  Cardiovascular:     Rate and Rhythm: Normal rate and regular rhythm.  Pulmonary:     Effort: Pulmonary effort is normal.     Breath sounds:  Normal breath sounds.  Abdominal:     General: There is no distension.     Palpations: Abdomen is soft.     Tenderness: There is abdominal tenderness in the suprapubic area.  Genitourinary:    Comments: External anal exam with hemorrhoid skin tags, deep pits between the tags, possibly looking like fistula tracks, papilla with opening and drainage to the right lower anus, and a second pit like opening closer to the anal verge, tender around the anus, refused internal exam, skin with some redness/ maceration, no fluctuance noted  Musculoskeletal:     Right lower leg: Edema present.     Left lower leg: Edema present.  Skin:    General: Skin is warm and dry.  Neurological:     General: No focal deficit present.     Mental Status: He is alert and oriented to person, place, and time.  Psychiatric:        Mood and Affect: Mood normal.        Behavior: Behavior normal.        Thought Content: Thought content normal.        Judgment: Judgment normal.     Results: Personally reviewed CT 06/2018 and 09/2018 -On 6/ 2020 see area with enhancement right of the anal region, no obvious fluid collection or tracking, on 9/ 2020 difficult to say if this went down far enough to catch any continued fluid collection, malrotation anatomy appreciated   Assessment & Plan:  Phillip Rios is a 68 y.o. 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 It is difficult to say what could be going on and he needs further and better examination.  The patient has not had a colonoscopy since 2016. He did go to the OR with Dr. Ladona Horns 06/2018 per his report. He is currently getting risk stratified for his colonoscopy and this is scheduled for Friday.  Some of this could also be from his radiation therapy as he did receive directed radiotherapy with Dr. Quitman Livings at [facility].   -Exam under anesthesia with possible biopsy, fistulotomy, seton placement -Discussed risk of bleeding, infection, recurrent abscess, injury to sphincter, need for seton, need for further intervention or surgery or possibility of findings something like cancer -He still needs his colonoscopy as planned with Dr. Laural Golden and will see him and Ms. Berniece Pap PA on this note  -Will let patient be seen by Dr. Harl Bowie and get on the schedule for next week, sent Dr. Harl Bowie an FYI message  -Recticare Lidocaine samples given for the patient to help with some relief   All questions were answered to the satisfaction of the patient and his wife. I spent over 45 minutes with the patient and his wife explaining the concerns and discussing the plan as mentioned above.     Virl Cagey 10/31/2018, 9:10 AM

## 2018-11-01 DIAGNOSIS — K603 Anal fistula, unspecified: Secondary | ICD-10-CM | POA: Insufficient documentation

## 2018-11-01 DIAGNOSIS — K6289 Other specified diseases of anus and rectum: Secondary | ICD-10-CM | POA: Insufficient documentation

## 2018-11-01 NOTE — Patient Instructions (Signed)
[place]  11/01/2018     @PREFPERIOPPHARMACY @   Your procedure is scheduled on  11/08/2018 .  Report to [facility] at  [phone]  A.M.  Call this number if you have problems the morning of surgery:  [phone]   Remember:  Do not eat or drink after midnight.                       Take these medicines the morning of surgery with A SIP OF WATER  Amlodipine, hydrocodoen(if needed), metoprolol, protonix, flomax    Do not wear jewelry, make-up or nail polish.  Do not wear lotions, powders, or perfume. Please wear deodorant and brush your teeth.  Do not shave 48 hours prior to surgery.  Men may shave face and neck.  Do not bring valuables to the hospital.  [facility] is not responsible for any belongings or valuables.  Contacts, dentures or bridgework may not be worn into surgery.  Leave your suitcase in the car.  After surgery it may be brought to your room.  For patients admitted to the hospital, discharge time will be determined by your treatment team.  Patients discharged the day of surgery will not be allowed to drive home.   Name and phone number of your driver:   family Special instructions:  Follow any instructions given to you concerning your plavix.  Please read over the following fact sheets that you were given. Anesthesia Post-op Instructions and Care and Recovery After Surgery       Anal Fistulotomy, Care After This sheet gives you information about how to care for yourself after your procedure. Your health care provider may also give you more specific instructions. If you have problems or questions, contact your health care provider. What can I expect after the procedure? After the procedure, it is common to have:  Some pain, discomfort, and swelling.  Increased pain during bowel movements.  Some bleeding from the wound. Follow these instructions at home: Medicines  Take over-the-counter and prescription medicines only as told by your  health care provider.  If you were prescribed an antibiotic medicine, take it as told by your health care provider. Do not stop taking the antibiotic even if you start to feel better. Activity  Do not drive for 24 hours if you received a medicine to help you relax (sedative) during your procedure.  Do not drive or use heavy machinery while taking prescription pain medicine.  Do not lift anything that is heavier than the limit that your health care provider tells you until he or she says that it is safe.  Rest as told by your health care provider. It is also important to move around in between periods of rest. Get up and move around as often as you can tolerate.  Return to your normal activities as told by your health care provider. Ask your health care provider what activities are safe for you. Lifestyle  Limit alcohol intake to no more than 1 drink a day for nonpregnant women and 2 drinks a day for men. One drink equals 12 oz of beer, 5 oz of wine, or 1 oz of hard liquor.  Do not use any products that contain nicotine or tobacco, such as cigarettes and e-cigarettes. If you need help quitting, ask your health care provider. Incision care   Follow instructions from your health care provider about how to take care of your incision. ? If  gauze (dressing) was placed in your incision during surgery, you may be told to remove it yourself after a certain period of time. Make sure you wash your hands with soap and water before and after removing your dressing. If soap and water are not available, use hand sanitizer. ? Do not remove your dressing unless told by your health care provider. You may be told to allow the dressing to come out with your first bowel movement after surgery, instead of removing the dressing. ? Leave stitches (sutures), skin glue, or adhesive strips in place. These skin closures may need to stay in place for 2 weeks or longer. Do not remove adhesive strips completely unless  your health care provider tells you to do that.  Keep the incision clean and dry.  Check your incision area every day for signs of infection. Check for: ? More redness, swelling, or pain. ? More fluid or blood. ? Warmth. ? Pus or a bad smell.  After having a bowel movement, clean the incision area using one of the following methods: ? Gently wipe with a moist towelette. ? Gently wipe with mild soap and water. ? Take a shower. ? Take a sitz bath. This is a warm water bath that is taken while you are sitting down.  You may apply ice to the incision area to reduce discomfort: ? Put ice in a plastic bag. ? Place a towel between your skin and the bag. ? Leave the ice on for 20 minutes, 2-3 times a day. Eating and drinking  Follow instructions from your health care provider about eating or drinking restrictions.  Drink enough fluid to keep your urine clear or pale yellow.  Eat lots of fiber. Fiber helps regulate bowel movements and prevent constipation. Foods that contain a lot of fiber include: ? Fruits. ? Vegetables. ? Whole grains. General instructions  Do not swim or use a hot tub until your health care provider says that this is safe.  If you have bleeding from your wound, wear an absorbent pad and change it often.  Wear compression stockings as told by your health care provider. These stockings help to prevent blood clots and reduce swelling in your legs.  Keep all follow-up visits as told by your health care provider. This is important. Contact a health care provider if:  You have more redness, swelling, or pain around your incision area.  Your incision area feels warm to the touch or firm.  You have pus or a bad smell coming from your incision area.  You have a fever or chills.  You develop swelling or tenderness in your groin area.  You cannot control when you have bowel movements.  You are leaking stool (incontinent).  You have trouble urinating.  You have  pain that does not get better with medicine. Get help right away if:  You have severe pain in your: ? Wound area. ? Abdomen.  You have sudden chest pain.  You become weak or you pass out (faint).  You have more fluid or blood coming from your incision.  You have bleeding from your incision that soaks 2 or more pads during 24 hours. This information is not intended to replace advice given to you by your health care provider. Make sure you discuss any questions you have with your health care provider. Document Released: 04/29/2015 Document Revised: 12/17/2016 Document Reviewed: 04/29/2015 Elsevier Patient Education  [place]. How to Use Chlorhexidine for Bathing Chlorhexidine gluconate (CHG) is a germ-killing (  antiseptic) solution that is used to clean the skin. It can get rid of the bacteria that normally live on the skin and can keep them away for about 24 hours. To clean your skin with CHG, you may be given:  A CHG solution to use in the shower or as part of a sponge bath.  A prepackaged cloth that contains CHG. Cleaning your skin with CHG may help lower the risk for infection:  While you are staying in the intensive care unit of the hospital.  If you have a vascular access, such as a central line, to provide short-term or long-term access to your veins.  If you have a catheter to drain urine from your bladder.  If you are on a ventilator. A ventilator is a machine that helps you breathe by moving air in and out of your lungs.  After surgery. What are the risks? Risks of using CHG include:  A skin reaction.  Hearing loss, if CHG gets in your ears.  Eye injury, if CHG gets in your eyes and is not rinsed out.  The CHG product catching fire. Make sure that you avoid smoking and flames after applying CHG to your skin. Do not use CHG:  If you have a chlorhexidine allergy or have previously reacted to chlorhexidine.  On babies younger than 5 months of age. How  to use CHG solution  Use CHG only as told by your health care provider, and follow the instructions on the label.  Use the full amount of CHG as directed. Usually, this is one bottle. During a shower Follow these steps when using CHG solution during a shower (unless your health care provider gives you different instructions): 1. Start the shower. 2. Use your normal soap and shampoo to wash your face and hair. 3. Turn off the shower or move out of the shower stream. 4. Pour the CHG onto a clean washcloth. Do not use any type of brush or rough-edged sponge. 5. Starting at your neck, lather your body down to your toes. Make sure you follow these instructions: ? If you will be having surgery, pay special attention to the part of your body where you will be having surgery. Scrub this area for at least 1 minute. ? Do not use CHG on your head or face. If the solution gets into your ears or eyes, rinse them well with water. ? Avoid your genital area. ? Avoid any areas of skin that have broken skin, cuts, or scrapes. ? Scrub your back and under your arms. Make sure to wash skin folds. 6. Let the lather sit on your skin for 1-2 minutes or as long as told by your health care provider. 7. Thoroughly rinse your entire body in the shower. Make sure that all body creases and crevices are rinsed well. 8. Dry off with a clean towel. Do not put any substances on your body afterward-such as powder, lotion, or perfume-unless you are told to do so by your health care provider. Only use lotions that are recommended by the manufacturer. 9. Put on clean clothes or pajamas. 10. If it is the night before your surgery, sleep in clean sheets.  During a sponge bath Follow these steps when using CHG solution during a sponge bath (unless your health care provider gives you different instructions): 1. Use your normal soap and shampoo to wash your face and hair. 2. Pour the CHG onto a clean washcloth. 3. Starting at your  neck, lather your body  down to your toes. Make sure you follow these instructions: ? If you will be having surgery, pay special attention to the part of your body where you will be having surgery. Scrub this area for at least 1 minute. ? Do not use CHG on your head or face. If the solution gets into your ears or eyes, rinse them well with water. ? Avoid your genital area. ? Avoid any areas of skin that have broken skin, cuts, or scrapes. ? Scrub your back and under your arms. Make sure to wash skin folds. 4. Let the lather sit on your skin for 1-2 minutes or as long as told by your health care provider. 5. Using a different clean, wet washcloth, thoroughly rinse your entire body. Make sure that all body creases and crevices are rinsed well. 6. Dry off with a clean towel. Do not put any substances on your body afterward-such as powder, lotion, or perfume-unless you are told to do so by your health care provider. Only use lotions that are recommended by the manufacturer. 7. Put on clean clothes or pajamas. 8. If it is the night before your surgery, sleep in clean sheets. How to use CHG prepackaged cloths  Only use CHG cloths as told by your health care provider, and follow the instructions on the label.  Use the CHG cloth on clean, dry skin.  Do not use the CHG cloth on your head or face unless your health care provider tells you to.  When washing with the CHG cloth: ? Avoid your genital area. ? Avoid any areas of skin that have broken skin, cuts, or scrapes. Before surgery Follow these steps when using a CHG cloth to clean before surgery (unless your health care provider gives you different instructions): 1. Using the CHG cloth, vigorously scrub the part of your body where you will be having surgery. Scrub using a back-and-forth motion for 3 minutes. The area on your body should be completely wet with CHG when you are done scrubbing. 2. Do not rinse. Discard the cloth and let the area  air-dry. Do not put any substances on the area afterward, such as powder, lotion, or perfume. 3. Put on clean clothes or pajamas. 4. If it is the night before your surgery, sleep in clean sheets.  For general bathing Follow these steps when using CHG cloths for general bathing (unless your health care provider gives you different instructions). 1. Use a separate CHG cloth for each area of your body. Make sure you wash between any folds of skin and between your fingers and toes. Wash your body in the following order, switching to a new cloth after each step: ? The front of your neck, shoulders, and chest. ? Both of your arms, under your arms, and your hands. ? Your stomach and groin area, avoiding the genitals. ? Your right leg and foot. ? Your left leg and foot. ? The back of your neck, your back, and your buttocks. 2. Do not rinse. Discard the cloth and let the area air-dry. Do not put any substances on your body afterward-such as powder, lotion, or perfume-unless you are told to do so by your health care provider. Only use lotions that are recommended by the manufacturer. 3. Put on clean clothes or pajamas. Contact a health care provider if:  Your skin gets irritated after scrubbing.  You have questions about using your solution or cloth. Get help right away if:  Your eyes become very red or swollen.  Your eyes itch badly.  Your skin itches badly and is red or swollen.  Your hearing changes.  You have trouble seeing.  You have swelling or tingling in your mouth or throat.  You have trouble breathing.  You swallow any chlorhexidine. Summary  Chlorhexidine gluconate (CHG) is a germ-killing (antiseptic) solution that is used to clean the skin. Cleaning your skin with CHG may help to lower your risk for infection.  You may be given CHG to use for bathing. It may be in a bottle or in a prepackaged cloth to use on your skin. Carefully follow your health care provider's  instructions and the instructions on the product label.  Do not use CHG if you have a chlorhexidine allergy.  Contact your health care provider if your skin gets irritated after scrubbing. This information is not intended to replace advice given to you by your health care provider. Make sure you discuss any questions you have with your health care provider. Document Released: 09/29/2011 Document Revised: 03/23/2018 Document Reviewed: 12/02/2016 Elsevier Patient Education  2020 [employer].

## 2018-11-01 NOTE — H&P (Signed)
[name] History and Physical  Reason for Referral: Rectal abscess?  Referring Physician: Monico Blitz, MD      Chief Complaint    Rectal Problems      Phillip Rios is a 68 y.o. male.  HPI: Phillip Rios is a 68 yo with reported anal pain and pressure for several months now. He says that he has a history of prostate cancer s/p radiation, and received this treatment June/July 2019. He says that since the pandemic he has not followed up with his Urologist, Dr. Mar Daring since that time. He has complaints of rectal pain and burning sensation constantly, and says he feels like he can never get clean. He has been also having some hematochezia and is getting scheduled for a colonoscopy with Dr. Laural Golden, but is getting risk stratified for this procedure on Friday with Dr. Harl Bowie.  He reports that in June 2020 he went to [facility] to [facility] and Dr. Ladona Horns took him for an I&D of a perirectal abscess. Their CT was down and he had a CT at [facility] at that time. This CT actually demonstrated a phlegmon like area but no obvious fluid.  He says that he had an I&D and his wife had to pack the area.  He denies ever hearing any concern for fistula formation. He denies any prior rectal abscess or drainage. He reports that his last colonoscopy was in 2016 and was incomplete with Dr. Ladona Horns. He was told to come back the next day, and then told it would have to be rescheduled, and the patient never followed up.  He has been on antibiotics for several weeks now.  He says that he has had a few months of bloody BMs. He has not had a rectal exam recently as he has refused. He has lost a few lbs in the last few months due to not wanting to eat. He saw Dr. Olevia Perches PA [facility] and noted lower abdominal pain with this bloody BMs, and a CT was performed 09/2018 that demonstrated no rectal abscess or concern for diverticulitis.   He reports a history of surgery as a baby and child for an  "obstruction" but is unsure what his pathology was or what was done. 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 He had a cardiac cath and stent placed in 08/2017 and has been on Plavix.  He denies any family or personal history of Crohn's disease or prior fistulas.       Past Medical History:  Diagnosis Date  . Cancer [facility])    prostate completed radiation 06/08/17  . Coronary artery disease    non obstructive  . Dyslipidemia   . GERD (gastroesophageal reflux disease)   . Hypertension   . Morbid obesity ([place])   . Sleep apnea    USES CPAP         Past Surgical History:  Procedure Laterality Date  . CARDIAC CATHETERIZATION     cath  . COLONOSCOPY    . CORONARY STENT INTERVENTION  09/01/2017  . CORONARY STENT INTERVENTION N/A 09/01/2017   Procedure: CORONARY STENT INTERVENTION;  Surgeon: Burnell Blanks, MD;  Location: [facility];  Service: Cardiovascular;  Laterality: N/A;  . INTRAVASCULAR PRESSURE WIRE/FFR STUDY N/A 09/01/2017   Procedure: INTRAVASCULAR PRESSURE WIRE/FFR STUDY;  Surgeon: Burnell Blanks, MD;  Location: [place] [facility];  Service: Cardiovascular;  Laterality: N/A;  . LEFT HEART CATH AND CORONARY ANGIOGRAPHY N/A 09/01/2017   Procedure: LEFT HEART CATH  AND CORONARY ANGIOGRAPHY;  Surgeon: Burnell Blanks, MD;  Location: [place];  Service: Cardiovascular;  Laterality: N/A;         Family History  Problem Relation Age of Onset  . Heart failure Father   . Heart disease Father     Social History        Tobacco Use  . Smoking status: Never Smoker  . Smokeless tobacco: Current User    Types: Chew  Substance Use Topics  . Alcohol use: No  . Drug use: No    Medications: I have reviewed the patient's current medications. Allergies as of 10/31/2018   No Known Allergies        Medication List       Accurate as of October 31, 2018  9:10 AM. If you have any questions, ask  your nurse or doctor.        acetaminophen 500 MG tablet Commonly known as: TYLENOL Take 500 mg by mouth every 6 (six) hours as needed for moderate pain or headache.   amLODipine 5 MG tablet Commonly known as: NORVASC Take 5 mg by mouth daily.   aspirin EC 81 MG tablet Take 81 mg by mouth daily.   atorvastatin 80 MG tablet Commonly known as: LIPITOR Take 1 tablet (80 mg total) by mouth daily at 6 PM.   clopidogrel 75 MG tablet Commonly known as: PLAVIX Take 1 tablet (75 mg total) by mouth daily with breakfast.   docusate sodium 100 MG capsule Commonly known as: COLACE Take 200 mg by mouth daily.   doxycycline 100 MG tablet Commonly known as: VIBRA-TABS Take 100 mg by mouth 2 (two) times daily.   hydrochlorothiazide 25 MG tablet Commonly known as: HYDRODIURIL Take 25 mg by mouth daily.   HYDROcodone-acetaminophen 5-325 MG tablet Commonly known as: NORCO/VICODIN Take 1 tablet by mouth 2 (two) times daily.   lisinopril 20 MG tablet Commonly known as: ZESTRIL Take 40 mg by mouth daily.   metoprolol succinate 25 MG 24 hr tablet Commonly known as: TOPROL-XL Take 1 tablet (25 mg total) by mouth daily. Take with or immediately following a meal.   nitroGLYCERIN 0.4 MG SL tablet Commonly known as: NITROSTAT Place 1 tablet (0.4 mg total) under the tongue every 5 (five) minutes as needed for chest pain.   pantoprazole 40 MG tablet Commonly known as: PROTONIX Take 40 mg by mouth daily.   tamsulosin 0.4 MG Caps capsule Commonly known as: FLOMAX Take 0.4 mg by mouth daily.        ROS:  A comprehensive review of systems was negative except for: Respiratory: positive for SOB Gastrointestinal: positive for abdominal pain and rectal pain, bloody BMs Genitourinary: positive for frequency Musculoskeletal: positive for back pain and stiff joints Endocrine: positive for tired/ sluggish  Blood pressure 113/76, pulse 88, temperature (!) 96.4 F (35.8  C), temperature source Tympanic, resp. rate 20, height 6\' 1"  (1.854 m), weight (!) 329 lb (149.2 kg), SpO2 92 %. Physical Exam Vitals signs reviewed.  Constitutional:      Appearance: He is obese.  HENT:     Head: Normocephalic and atraumatic.     Nose: Nose normal.     Mouth/Throat:     Mouth: Mucous membranes are moist.  Eyes:     Extraocular Movements: Extraocular movements intact.     Pupils: Pupils are equal, round, and reactive to light.  Neck:     Musculoskeletal: Normal range of motion.  Cardiovascular:  Rate and Rhythm: Normal rate and regular rhythm.  Pulmonary:     Effort: Pulmonary effort is normal.     Breath sounds: Normal breath sounds.  Abdominal:     General: There is no distension.     Palpations: Abdomen is soft.     Tenderness: There is abdominal tenderness in the suprapubic area.  Genitourinary:    Comments: External anal exam with hemorrhoid skin tags, deep pits between the tags, possibly looking like fistula tracks, papilla with opening and drainage to the right lower anus, and a second pit like opening closer to the anal verge, tender around the anus, refused internal exam, skin with some redness/ maceration, no fluctuance noted  Musculoskeletal:     Right lower leg: Edema present.     Left lower leg: Edema present.  Skin:    General: Skin is warm and dry.  Neurological:     General: No focal deficit present.     Mental Status: He is alert and oriented to person, place, and time.  Psychiatric:        Mood and Affect: Mood normal.        Behavior: Behavior normal.        Thought Content: Thought content normal.        Judgment: Judgment normal.     Results: Personally reviewed CT 06/2018 and 09/2018 -On 6/ 2020 see area with enhancement right of the anal region, no obvious fluid collection or tracking, on 9/ 2020 difficult to say if this went down far enough to catch any continued fluid collection, malrotation anatomy appreciated   Assessment  & Plan:  ABDOUL BLACKMON is a 68 y.o. 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 It is difficult to say what could be going on and he needs further and better examination.  The patient has not had a colonoscopy since 2016. He did go to the OR with Dr. Ladona Horns 06/2018 per his report. He is currently getting risk stratified for his colonoscopy and this is scheduled for Friday.  Some of this could also be from his radiation therapy as he did receive directed radiotherapy with Dr. Quitman Livings at [facility].   -Exam under anesthesia with possible biopsy, fistulotomy, seton placement -Discussed risk of bleeding, infection, recurrent abscess, injury to sphincter, need for seton, need for further intervention or surgery or possibility of findings something like cancer -He still needs his colonoscopy as planned with Dr. Laural Golden and will see him and Ms. Berniece Pap PA on this note  -Will let patient be seen by Dr. Harl Bowie and get on the schedule for next week, sent Dr. Harl Bowie an FYI message  -Recticare Lidocaine samples given for the patient to help with some relief   All questions were answered to the satisfaction of the patient and his wife. I spent over 45 minutes with the patient and his wife explaining the concerns and discussing the plan as mentioned above.     Phillip Rios 10/31/2018, 9:10 AM

## 2018-11-03 ENCOUNTER — Ambulatory Visit (INDEPENDENT_AMBULATORY_CARE_PROVIDER_SITE_OTHER): Payer: Medicare HMO | Admitting: Cardiology

## 2018-11-03 ENCOUNTER — Encounter: Payer: Self-pay | Admitting: Cardiology

## 2018-11-03 ENCOUNTER — Other Ambulatory Visit: Payer: Self-pay

## 2018-11-03 VITALS — BP 122/78 | HR 80 | Ht 73.0 in | Wt 329.0 lb

## 2018-11-03 DIAGNOSIS — Z01810 Encounter for preprocedural cardiovascular examination: Secondary | ICD-10-CM | POA: Diagnosis not present

## 2018-11-03 DIAGNOSIS — I2510 Atherosclerotic heart disease of native coronary artery without angina pectoris: Secondary | ICD-10-CM

## 2018-11-03 DIAGNOSIS — I10 Essential (primary) hypertension: Secondary | ICD-10-CM | POA: Diagnosis not present

## 2018-11-03 NOTE — Progress Notes (Signed)
Clinical Summary Phillip Rios is a 68 y.o.male seen today for follow up of the following medical problems.   1. Chest pain/CAD - seen 06/2017 in ER with chest pain - noncardiac in description. Constant for 7 days, worst with palpation - CT PE was negative. Trop neg x 2. EKG without acute ischemic changes  - 08/2017 nuclear stress orderded by pcp completed at [facility]: severe areas of reversibility in the apex, lateral wall, and inferior wall all comletely reversible. - 08/2017 cath with 80% prox LAD lesion, s/p DES.    - no recent chest pain. No SOB or DOE - compliant with meds  2. Preoperative evaluation - being considered for anal fistula surgery     SH: works as Engineer, building services.Currently out of work due to severe knee pain, reports he needs a replacement.    Past Medical History:  Diagnosis Date  . Cancer [facility])    prostate completed radiation 06/08/17  . Coronary artery disease    non obstructive  . Dyslipidemia   . GERD (gastroesophageal reflux disease)   . Hypertension   . Morbid obesity ([place])   . Sleep apnea    USES CPAP     No Known Allergies   Current Outpatient Medications  Medication Sig Dispense Refill  . acetaminophen (TYLENOL) 500 MG tablet Take 500 mg by mouth every 6 (six) hours as needed for moderate pain or headache.    Marland Kitchen amLODipine (NORVASC) 5 MG tablet Take 5 mg by mouth daily.    Marland Kitchen aspirin EC 81 MG tablet Take 81 mg by mouth daily.    Marland Kitchen atorvastatin (LIPITOR) 80 MG tablet Take 1 tablet (80 mg total) by mouth daily at 6 PM. 90 tablet 3  . clopidogrel (PLAVIX) 75 MG tablet Take 1 tablet (75 mg total) by mouth daily with breakfast. 90 tablet 3  . docusate sodium (COLACE) 100 MG capsule Take 200 mg by mouth daily.    Marland Kitchen doxycycline (VIBRA-TABS) 100 MG tablet Take 100 mg by mouth 2 (two) times daily.    . hydrochlorothiazide (HYDRODIURIL) 25 MG tablet Take 25 mg by mouth daily.    Marland Kitchen HYDROcodone-acetaminophen (NORCO/VICODIN) 5-325 MG tablet  Take 1 tablet by mouth 2 (two) times daily.    Marland Kitchen lisinopril (PRINIVIL,ZESTRIL) 20 MG tablet Take 40 mg by mouth daily.    . metoprolol succinate (TOPROL-XL) 50 MG 24 hr tablet Take 25 mg by mouth daily.    . nitroGLYCERIN (NITROSTAT) 0.4 MG SL tablet Place 1 tablet (0.4 mg total) under the tongue every 5 (five) minutes as needed for chest pain. 25 tablet 2  . pantoprazole (PROTONIX) 40 MG tablet Take 40 mg by mouth daily.  2  . tamsulosin (FLOMAX) 0.4 MG CAPS capsule Take 0.4 mg by mouth daily.     No current facility-administered medications for this visit.      Past Surgical History:  Procedure Laterality Date  . CARDIAC CATHETERIZATION     cath  . COLONOSCOPY    . CORONARY STENT INTERVENTION  09/01/2017  . CORONARY STENT INTERVENTION N/A 09/01/2017   Procedure: CORONARY STENT INTERVENTION;  Surgeon: Burnell Blanks, MD;  Location: [place] [facility];  Service: Cardiovascular;  Laterality: N/A;  . INTRAVASCULAR PRESSURE WIRE/FFR STUDY N/A 09/01/2017   Procedure: INTRAVASCULAR PRESSURE WIRE/FFR STUDY;  Surgeon: Burnell Blanks, MD;  Location: [place] [facility];  Service: Cardiovascular;  Laterality: N/A;  . LEFT HEART CATH AND CORONARY ANGIOGRAPHY N/A 09/01/2017   Procedure:  LEFT HEART CATH AND CORONARY ANGIOGRAPHY;  Surgeon: Burnell Blanks, MD;  Location: [place] [facility];  Service: Cardiovascular;  Laterality: N/A;     No Known Allergies    Family History  Problem Relation Age of Onset  . Heart failure Father   . Heart disease Father      Social History Mr. Chabot reports that he has never smoked. His smokeless tobacco use includes chew. Mr. Strimple reports no history of alcohol use.   Review of Systems CONSTITUTIONAL: No weight loss, fever, chills, weakness or fatigue.  HEENT: Eyes: No visual loss, blurred vision, double vision or yellow sclerae.No hearing loss, sneezing, congestion, runny nose or sore throat.  SKIN: No rash or itching.   CARDIOVASCULAR: per hpi RESPIRATORY: No shortness of breath, cough or sputum.  GASTROINTESTINAL: No anorexia, nausea, vomiting or diarrhea. No abdominal pain or blood.  GENITOURINARY: No burning on urination, no polyuria NEUROLOGICAL: No headache, dizziness, syncope, paralysis, ataxia, numbness or tingling in the extremities. No change in bowel or bladder control.  MUSCULOSKELETAL: No muscle, back pain, joint pain or stiffness.  LYMPHATICS: No enlarged nodes. No history of splenectomy.  PSYCHIATRIC: No history of depression or anxiety.  ENDOCRINOLOGIC: No reports of sweating, cold or heat intolerance. No polyuria or polydipsia.  Marland Kitchen   Physical Examination Today's Vitals   11/03/18 1305  BP: 122/78  Pulse: 80  SpO2: 95%  Weight: (!) 329 lb (149.2 kg)  Height: 6\' 1"  (1.854 m)   Body mass index is 43.41 kg/m.  Gen: resting comfortably, no acute distress HEENT: no scleral icterus, pupils equal round and reactive, no palptable cervical adenopathy,  CV: RRR, no m/r/g, no jvd Resp: Clear to auscultation bilaterally GI: abdomen is soft, non-tender, non-distended, normal bowel sounds, no hepatosplenomegaly MSK: extremities are warm, no edema.  Skin: warm, no rash Neuro:  no focal deficits Psych: appropriate affect   Diagnostic Studies  08/2017 cath  Prox LAD lesion is 80% stenosed.  A drug-eluting stent was successfully placed using a STENT SIERRA 2.75 X 12 MM.  Post intervention, there is a 0% residual stenosis.  The left ventricular systolic function is normal.  LV end diastolic pressure is normal.  The left ventricular ejection fraction is 55-65% by visual estimate.  There is no mitral valve regurgitation.  1. Severe stenosis mid LAD. The stenosis was eccentric and in some views did not appear to be severe. FFR 0.8 in the mid LAD suggesting the stenosis was likely flow limiting. Given the abnormal FFR and the abnormal nuclear stress test, PCI was performed.  2.  Successful PTCA/DES x 1 mid LAD 3. Normal LV systolic function  Post cathRecommendations: Will start a statin.  Recommend uninterrupted dual antiplatelet therapy with Aspirin 81mg  daily and Clopidogrel 75mg  dailyfor a minimum of 12 months (ACS - Class I recommendation).  06/2016 echo Study Conclusions  - Procedure narrative: Transthoracic echocardiography. Image quality was suboptimal with poor valvular and endocardial visualization in several views. - Left ventricle: The cavity size was normal. Wall thickness was increased in a pattern of mild LVH. Systolic function was vigorous. The estimated ejection fraction was in the range of 65% to 70%. Images were inadequate for LV wall motion assessment. Doppler parameters are consistent with abnormal left ventricular relaxation (grade 1 diastolic dysfunction). - Aorta: Mild aortic root enlargement.    Assessment and Plan  1.CAD - s/p DES to LAD over 1 year ago, no recent symptoms. He will stop plavix, continue other meds  2. HTN -  at goal, continue current meds   3. Preoperative evaluation - ok to stop plavix all together, he has completed over a year - ok to proceed with surgery from cardiac standpoint   F/u 6 months        Arnoldo Lenis, M.D.

## 2018-11-03 NOTE — Patient Instructions (Signed)
Your physician wants you to follow-up in: 6 MONTHS WITH DR BRANCH You will receive a reminder letter in the mail two months in advance. If you don't receive a letter, please call our office to schedule the follow-up appointment.  Your physician has recommended you make the following change in your medication:   STOP PLAVIX   Thank you for choosing [facility]!!     

## 2018-11-06 ENCOUNTER — Other Ambulatory Visit: Payer: Self-pay

## 2018-11-06 ENCOUNTER — Other Ambulatory Visit (HOSPITAL_COMMUNITY)
Admission: RE | Admit: 2018-11-06 | Discharge: 2018-11-06 | Disposition: A | Discharge status: Home / Self Care | Payer: Medicare HMO | Source: Ambulatory Visit | Attending: General Surgery | Admitting: General Surgery

## 2018-11-06 ENCOUNTER — Encounter (HOSPITAL_COMMUNITY): Payer: Self-pay

## 2018-11-06 ENCOUNTER — Encounter (HOSPITAL_COMMUNITY)
Admission: RE | Admit: 2018-11-06 | Discharge: 2018-11-06 | Disposition: A | Discharge status: Home / Self Care | Payer: Medicare HMO | Source: Ambulatory Visit | Attending: General Surgery | Admitting: General Surgery

## 2018-11-06 DIAGNOSIS — Z01818 Encounter for other preprocedural examination: Secondary | ICD-10-CM | POA: Diagnosis not present

## 2018-11-06 DIAGNOSIS — K603 Anal fistula: Secondary | ICD-10-CM | POA: Diagnosis not present

## 2018-11-06 LAB — SARS CORONAVIRUS 2 (TAT 6-24 HRS): SARS Coronavirus 2: NEGATIVE

## 2018-11-07 ENCOUNTER — Encounter: Payer: Self-pay | Admitting: *Deleted

## 2018-11-08 ENCOUNTER — Ambulatory Visit (HOSPITAL_COMMUNITY): Payer: Medicare HMO | Admitting: Anesthesiology

## 2018-11-08 ENCOUNTER — Other Ambulatory Visit: Payer: Self-pay

## 2018-11-08 ENCOUNTER — Encounter (HOSPITAL_COMMUNITY): Payer: Self-pay | Admitting: *Deleted

## 2018-11-08 ENCOUNTER — Encounter (HOSPITAL_COMMUNITY)
Admission: RE | Disposition: A | Discharge status: Home / Self Care | Payer: Self-pay | Source: Home / Self Care | Attending: General Surgery

## 2018-11-08 ENCOUNTER — Ambulatory Visit (HOSPITAL_COMMUNITY)
Admission: RE | Admit: 2018-11-08 | Discharge: 2018-11-08 | Disposition: A | Discharge status: Home / Self Care | Payer: Medicare HMO | Attending: General Surgery | Admitting: General Surgery

## 2018-11-08 DIAGNOSIS — I10 Essential (primary) hypertension: Secondary | ICD-10-CM | POA: Insufficient documentation

## 2018-11-08 DIAGNOSIS — Z8546 Personal history of malignant neoplasm of prostate: Secondary | ICD-10-CM | POA: Insufficient documentation

## 2018-11-08 DIAGNOSIS — Z7902 Long term (current) use of antithrombotics/antiplatelets: Secondary | ICD-10-CM | POA: Diagnosis not present

## 2018-11-08 DIAGNOSIS — Z7982 Long term (current) use of aspirin: Secondary | ICD-10-CM | POA: Diagnosis not present

## 2018-11-08 DIAGNOSIS — E785 Hyperlipidemia, unspecified: Secondary | ICD-10-CM | POA: Diagnosis not present

## 2018-11-08 DIAGNOSIS — K6289 Other specified diseases of anus and rectum: Secondary | ICD-10-CM | POA: Diagnosis present

## 2018-11-08 DIAGNOSIS — K603 Anal fistula, unspecified: Secondary | ICD-10-CM | POA: Diagnosis present

## 2018-11-08 DIAGNOSIS — G4730 Sleep apnea, unspecified: Secondary | ICD-10-CM | POA: Diagnosis not present

## 2018-11-08 DIAGNOSIS — Z79899 Other long term (current) drug therapy: Secondary | ICD-10-CM | POA: Insufficient documentation

## 2018-11-08 DIAGNOSIS — I2510 Atherosclerotic heart disease of native coronary artery without angina pectoris: Secondary | ICD-10-CM | POA: Insufficient documentation

## 2018-11-08 DIAGNOSIS — K625 Hemorrhage of anus and rectum: Secondary | ICD-10-CM | POA: Diagnosis present

## 2018-11-08 DIAGNOSIS — Z955 Presence of coronary angioplasty implant and graft: Secondary | ICD-10-CM | POA: Diagnosis not present

## 2018-11-08 DIAGNOSIS — Z923 Personal history of irradiation: Secondary | ICD-10-CM | POA: Diagnosis not present

## 2018-11-08 DIAGNOSIS — Z6841 Body Mass Index (BMI) 40.0 and over, adult: Secondary | ICD-10-CM | POA: Diagnosis not present

## 2018-11-08 DIAGNOSIS — K219 Gastro-esophageal reflux disease without esophagitis: Secondary | ICD-10-CM | POA: Diagnosis not present

## 2018-11-08 HISTORY — PX: PLACEMENT OF SETON: SHX6029

## 2018-11-08 SURGERY — EXAM UNDER ANESTHESIA
Anesthesia: General

## 2018-11-08 MED ORDER — EPHEDRINE SULFATE 50 MG/ML IJ SOLN
INTRAMUSCULAR | Status: DC | PRN
  Administered 2018-11-08: 5 mg via INTRAVENOUS
  Administered 2018-11-08: 10 mg via INTRAVENOUS
  Administered 2018-11-08: 5 mg via INTRAVENOUS
  Administered 2018-11-08: 10 mg via INTRAVENOUS

## 2018-11-08 MED ORDER — PROMETHAZINE HCL 25 MG/ML IJ SOLN: 6.2500 mg | INTRAMUSCULAR | Status: DC | PRN

## 2018-11-08 MED ORDER — HYDROCODONE-ACETAMINOPHEN 7.5-325 MG PO TABS
1.0000 | ORAL_TABLET | Freq: Once | ORAL | Status: AC | PRN
  Administered 2018-11-08: 1 via ORAL
  Filled 2018-11-08: qty 1

## 2018-11-08 MED ORDER — ESMOLOL HCL 100 MG/10ML IV SOLN
INTRAVENOUS | Status: DC | PRN
  Administered 2018-11-08 (×2): 10 mg via INTRAVENOUS

## 2018-11-08 MED ORDER — METHYLENE BLUE 0.5 % INJ SOLN
INTRAVENOUS | Status: AC
  Filled 2018-11-08: qty 10

## 2018-11-08 MED ORDER — LIDOCAINE HCL (CARDIAC) PF 100 MG/5ML IV SOSY
PREFILLED_SYRINGE | INTRAVENOUS | Status: DC | PRN
  Administered 2018-11-08: 100 mg via INTRAVENOUS

## 2018-11-08 MED ORDER — FENTANYL CITRATE (PF) 250 MCG/5ML IJ SOLN
INTRAMUSCULAR | Status: AC
  Filled 2018-11-08: qty 5

## 2018-11-08 MED ORDER — PROPOFOL 10 MG/ML IV BOLUS
INTRAVENOUS | Status: AC
  Filled 2018-11-08: qty 40

## 2018-11-08 MED ORDER — SUCCINYLCHOLINE CHLORIDE 20 MG/ML IJ SOLN
INTRAMUSCULAR | Status: DC | PRN
  Administered 2018-11-08: 140 mg via INTRAVENOUS

## 2018-11-08 MED ORDER — HYDROMORPHONE HCL 1 MG/ML IJ SOLN
0.2500 mg | INTRAMUSCULAR | Status: DC | PRN
  Administered 2018-11-08 (×4): 0.5 mg via INTRAVENOUS
  Filled 2018-11-08 (×4): qty 0.5

## 2018-11-08 MED ORDER — SUCCINYLCHOLINE CHLORIDE 200 MG/10ML IV SOSY
PREFILLED_SYRINGE | INTRAVENOUS | Status: AC
  Filled 2018-11-08: qty 10

## 2018-11-08 MED ORDER — MIDAZOLAM HCL 2 MG/2ML IJ SOLN
INTRAMUSCULAR | Status: AC
  Filled 2018-11-08: qty 2

## 2018-11-08 MED ORDER — HYDROGEN PEROXIDE 3 % EX SOLN
CUTANEOUS | Status: DC | PRN
  Administered 2018-11-08: 1

## 2018-11-08 MED ORDER — OXYCODONE HCL 5 MG PO TABS: 5.0000 mg | ORAL_TABLET | ORAL | 0 refills | Status: DC | PRN

## 2018-11-08 MED ORDER — LACTATED RINGERS IV SOLN
INTRAVENOUS | Status: DC
  Administered 2018-11-08: 09:00:00 1000 mL via INTRAVENOUS

## 2018-11-08 MED ORDER — LIDOCAINE VISCOUS HCL 2 % MT SOLN
OROMUCOSAL | Status: AC
  Filled 2018-11-08: qty 15

## 2018-11-08 MED ORDER — ROCURONIUM BROMIDE 10 MG/ML (PF) SYRINGE
PREFILLED_SYRINGE | INTRAVENOUS | Status: AC
  Filled 2018-11-08: qty 10

## 2018-11-08 MED ORDER — ONDANSETRON HCL 4 MG/2ML IJ SOLN
INTRAMUSCULAR | Status: AC
  Filled 2018-11-08: qty 2

## 2018-11-08 MED ORDER — MIDAZOLAM HCL 2 MG/2ML IJ SOLN: 0.5000 mg | Freq: Once | INTRAMUSCULAR | Status: DC | PRN

## 2018-11-08 MED ORDER — PROPOFOL 10 MG/ML IV BOLUS
INTRAVENOUS | Status: DC | PRN
  Administered 2018-11-08: 250 mg via INTRAVENOUS

## 2018-11-08 MED ORDER — CHLORHEXIDINE GLUCONATE CLOTH 2 % EX PADS: 6.0000 | MEDICATED_PAD | Freq: Once | CUTANEOUS | Status: DC

## 2018-11-08 MED ORDER — SUGAMMADEX SODIUM 200 MG/2ML IV SOLN
INTRAVENOUS | Status: DC | PRN
  Administered 2018-11-08: 294.8 mg via INTRAVENOUS

## 2018-11-08 MED ORDER — ROCURONIUM BROMIDE 100 MG/10ML IV SOLN
INTRAVENOUS | Status: DC | PRN
  Administered 2018-11-08: 50 mg via INTRAVENOUS
  Administered 2018-11-08: 10 mg via INTRAVENOUS

## 2018-11-08 MED ORDER — LIDOCAINE VISCOUS HCL 2 % MT SOLN
OROMUCOSAL | Status: DC | PRN
  Administered 2018-11-08: 1

## 2018-11-08 MED ORDER — ONDANSETRON HCL 4 MG/2ML IJ SOLN
INTRAMUSCULAR | Status: DC | PRN
  Administered 2018-11-08: 4 mg via INTRAVENOUS

## 2018-11-08 MED ORDER — EPHEDRINE 5 MG/ML INJ
INTRAVENOUS | Status: AC
  Filled 2018-11-08: qty 10

## 2018-11-08 MED ORDER — ESMOLOL HCL 100 MG/10ML IV SOLN
INTRAVENOUS | Status: AC
  Filled 2018-11-08: qty 10

## 2018-11-08 MED ORDER — SODIUM CHLORIDE 0.9 % IR SOLN
Status: DC | PRN
  Administered 2018-11-08: 1000 mL

## 2018-11-08 MED ORDER — BUPIVACAINE HCL (PF) 0.5 % IJ SOLN
INTRAMUSCULAR | Status: AC
  Filled 2018-11-08: qty 30

## 2018-11-08 MED ORDER — FENTANYL CITRATE (PF) 250 MCG/5ML IJ SOLN
INTRAMUSCULAR | Status: DC | PRN
  Administered 2018-11-08 (×3): 50 ug via INTRAVENOUS
  Administered 2018-11-08: 100 ug via INTRAVENOUS

## 2018-11-08 MED ORDER — BUPIVACAINE HCL (PF) 0.5 % IJ SOLN
INTRAMUSCULAR | Status: DC | PRN
  Administered 2018-11-08: 8 mL

## 2018-11-08 MED ORDER — LIDOCAINE 2% (20 MG/ML) 5 ML SYRINGE
INTRAMUSCULAR | Status: AC
  Filled 2018-11-08: qty 5

## 2018-11-08 SURGICAL SUPPLY — 37 items
CANNULA VESSEL 3MM 2 BLNT TIP (CANNULA) ×2 IMPLANT
CLOTH BEACON ORANGE TIMEOUT ST (SAFETY) ×4 IMPLANT
COVER LIGHT HANDLE STERIS (MISCELLANEOUS) ×8 IMPLANT
COVER WAND RF STERILE (DRAPES) ×2 IMPLANT
DECANTER SPIKE VIAL GLASS SM (MISCELLANEOUS) ×4 IMPLANT
DRSG TELFA 3X8 NADH (GAUZE/BANDAGES/DRESSINGS) ×4 IMPLANT
ELECT REM PT RETURN 9FT ADLT (ELECTROSURGICAL) ×4
ELECTRODE REM PT RTRN 9FT ADLT (ELECTROSURGICAL) ×2 IMPLANT
GAUZE SPONGE 4X4 12PLY STRL (GAUZE/BANDAGES/DRESSINGS) ×4 IMPLANT
GLOVE BIO SURGEON STRL SZ 6.5 (GLOVE) ×1 IMPLANT
GLOVE BIO SURGEONS STRL SZ 6.5 (GLOVE) ×1
GLOVE BIOGEL PI IND STRL 7.0 (GLOVE) ×2 IMPLANT
GLOVE BIOGEL PI INDICATOR 7.0 (GLOVE) ×4
GLOVE ECLIPSE 6.5 STRL STRAW (GLOVE) ×2 IMPLANT
GLOVE SURG SS PI 7.5 STRL IVOR (GLOVE) ×8 IMPLANT
GOWN STRL REUS W/TWL LRG LVL3 (GOWN DISPOSABLE) ×4 IMPLANT
GOWN STRL REUS W/TWL XL LVL3 (GOWN DISPOSABLE) ×4 IMPLANT
HEMOSTAT SURGICEL 4X8 (HEMOSTASIS) ×4 IMPLANT
KIT TURNOVER CYSTO (KITS) ×4 IMPLANT
MANIFOLD NEPTUNE II (INSTRUMENTS) ×4 IMPLANT
NDL HYPO 25X1 1.5 SAFETY (NEEDLE) IMPLANT
NEEDLE HYPO 22GX1.5 SAFETY (NEEDLE) ×6 IMPLANT
NEEDLE HYPO 25X1 1.5 SAFETY (NEEDLE) ×4 IMPLANT
NS IRRIG 1000ML POUR BTL (IV SOLUTION) ×4 IMPLANT
PACK PERI GYN (CUSTOM PROCEDURE TRAY) ×4 IMPLANT
PAD ARMBOARD 7.5X6 YLW CONV (MISCELLANEOUS) ×4 IMPLANT
PAD DRESSING TELFA 3X8 NADH (GAUZE/BANDAGES/DRESSINGS) IMPLANT
SET BASIN LINEN APH (SET/KITS/TRAYS/PACK) ×4 IMPLANT
SURGILUBE 2OZ TUBE FLIPTOP (MISCELLANEOUS) ×4 IMPLANT
SUT SILK 0 FSL (SUTURE) ×18 IMPLANT
SUT SILK 1 TIES 10/18 (SUTURE) ×2 IMPLANT
SWAB COLLECTION DEVICE MRSA (MISCELLANEOUS) IMPLANT
SWAB CULTURE ESWAB REG 1ML (MISCELLANEOUS) IMPLANT
SYR 20ML LL LF (SYRINGE) ×4 IMPLANT
SYR 30ML LL (SYRINGE) ×2 IMPLANT
SYR CONTROL 10ML LL (SYRINGE) ×4 IMPLANT
VESSEL LOOPS MAXI RED (MISCELLANEOUS) ×2 IMPLANT

## 2018-11-08 NOTE — Op Note (Signed)
[name] Operative Note  11/08/18  Preoperative Diagnosis:  Perianal fistula; history of prostate radiation   Postoperative Diagnosis: Multiple transsphincteric fistulas; history of prostate radiation with perianal skin changes    Procedure(s) Performed:  Anorectal exam under anesthesia with anoscopy, Seton placement to 2 separate transphincteric fistulas tracking from right to anterior, and Seton placement to 2 separate transphincteric fistulas tracking midline posterior (4 setons total); biopsy of perifistula skin on right    Surgeon: Lanell Matar. Constance Haw, MD   Assistants: No qualified resident was available    Anesthesia: General endotracheal   Anesthesiologist: Lenice Llamas, MD    Specimens: Perifistula skin from right fistula track opening    Estimated Blood Loss: Minimal   Blood Replacement: None    Complications: None   Wound Class: Dirty    Operative Indications: Mr. Welsh is a 68 yo with a history of prostate cancer s/p directed radiation who has been having multiple perianal abscesses in the last few months, and has been having pretty severe and constant perianal pain and some bleeding per his report. He is due for a colonoscopy, and is in the middle of getting this scheduled. He saw me in the office after receiving further antibiotics for the abscess and undergoing drainage at [facility]. When I examined him his perianal region had concern for multiple fistulas, and some skin changes possibly related to his prior radiation.  I offered him an exam under anesthesia with biopsy, seton placement versus fistulotomy, and he opted to proceed. We discussed bleeding, infection, need for further surgery, seton placement, possibility of finding something concerning like a cancer, and still need for colonoscopy. He was seen by his cardiologist prior to the procedure and his Plavix was stopped by the cardiologist as he had been on it 1 year for stent placement.     Findings: Transsphincteric fistula tracking from the right perianal region anterior with skin changes that appear woody and fibrotic at the fistula opening on the skin, second transsphincteric fistula tracking from just right of anterior midline to anterior; third and fourth transsphincteric fistulas tracking from posterior midline to posterior midline; increased anal tone on exam with some scarring from fistula tracks anteriorly and posteriorly on internal exam; large perianal skin tags at the corresponding hemorrhoidal columns    Procedure: The patient was taken to the operating room and placed supine. General endotracheal anesthesia was induced. Intravenous antibiotics were not administered given possibility of cultures. The anus and perineum were prepped and draped in the usual sterile fashion.   On visual exam, there was obvious skin changes and an opening with papilla on the right perianal skin approximately 5 cm from the anus.  There was purulence coming from this opening.  There were additional pits around the anal verge. There was another in the right of midline perianal region about 1 cm from the anus. Two additional pits were noted in the posterior midline about 1 cm from the anal verge.  There were large significant skin tags in the hemorrhoidal columns.  Digital exam demonstrated a tight anal sphincter with scarring anterior and posterior at the anal verge.  I was able to only place a small anoscopy into the anal opening due to this increased tone.    The external openings were probed with a catheter and flushed with peroxide. The internal openings were identified, and I used lacrimal probes to cannulate the track with care and without resistance.  Each track was inspected and significant sphincter muscle was  involved in these transsphincteric fistulas. Given his history of radiation, the multiple fistulas and the sphincter involvement, I felt that he would need seton placement in these  fistulas.  With each fistula after probing it with the lacrimal probes, a 0 silk suture was tied to the probe and pulled through the opening. A red vessel loop was then tied to the silk suture, and pulled through the track.  The vessel loop was secured with 2 separate 0 silk sutures.  This was repeated 4 times in each fistula track.  The skin at the opening of the right fistula opening at 5 cm from the anus was biopsied due to appearing woody and fibrotic.  Pad and mesh underwear were placed on the patient.   Final inspection revealed acceptable hemostasis. All counts were correct at the end of the case. The patient was awakened from anesthesia and extubated without complication.  The patient went to the PACU in stable condition.    Curlene Labrum, MD [facility] [address] [place], [place] [address] [phone] (office)

## 2018-11-08 NOTE — Progress Notes (Signed)
[facility]  Notified Vivien Rota, wife surgery complete. Very complex multiple fistulas noted. This could be from radiation versus something like Crohn's.    Will need referral to Colorectal. Wife prefers [place], [place].  Curlene Labrum, MD [facility] [address] [place],  [phone] (office)

## 2018-11-08 NOTE — Transfer of Care (Signed)
Immediate Anesthesia Transfer of Care Note  Patient: Phillip Rios  Procedure(s) Performed: Jasmine December UNDER ANESTHESIA (N/A ) PLACEMENT OF SETON X 4  Patient Location: PACU  Anesthesia Type:General  Level of Consciousness: awake, alert  and oriented  Airway & Oxygen Therapy: Patient Spontanous Breathing and Patient connected to face mask oxygen  Post-op Assessment: Report given to RN, Post -op Vital signs reviewed and stable and Patient moving all extremities  Post vital signs: Reviewed and stable  Last Vitals:  Vitals Value Taken Time  BP 121/73 11/08/18 1120  Temp    Pulse 91 11/08/18 1124  Resp 22 11/08/18 1124  SpO2 93 % 11/08/18 1124  Vitals shown include unvalidated device data.  Last Pain:  Vitals:   11/08/18 0907  TempSrc: Oral  PainSc: 8       Patients Stated Pain Goal: 8 ([id number])  Complications: No apparent anesthesia complications

## 2018-11-08 NOTE — Anesthesia Postprocedure Evaluation (Signed)
Anesthesia Post Note  Patient: Phillip Rios  Procedure(s) Performed: Jasmine December UNDER ANESTHESIA (N/A ) PLACEMENT OF SETON X 4  Patient location during evaluation: PACU Anesthesia Type: General Level of consciousness: awake and alert and oriented Pain management: pain level controlled Vital Signs Assessment: post-procedure vital signs reviewed and stable Respiratory status: spontaneous breathing and nonlabored ventilation Cardiovascular status: stable Postop Assessment: no apparent nausea or vomiting and able to ambulate Anesthetic complications: no     Last Vitals:  Vitals:   11/08/18 0907  BP: 120/79  Pulse: 75  Resp: (!) 22  Temp: 37.1 C  SpO2: 96%    Last Pain:  Vitals:   11/08/18 0907  TempSrc: Oral  PainSc: 8                  Haidy Kackley Hristova

## 2018-11-08 NOTE — Discharge Instructions (Signed)
Seton Placement/ Anal Fistula Discharge Instructions:  ° °What is the purpose of a seton placement? °Setons are used to treat anal fistulas. A fistula is an abnormal tunnel that °connects two structures. An anal fistula is an abnormal tunnel that forms °between the anus and the surrounding skin. The fistula’s internal opening is in °the anus and the external opening is typically on the skin around the anus or °on the buttocks. ° °Activity:  °Many individuals are able to return to work and resume routine activities the °day after their procedure. Some people require a week off from work if the °surgery is more extensive. Generally, within 1-2 weeks, surgical discomfort is °Minimal. ° °Care Instructions: °You can resume your normal diet once you have sufficiently recovered from °anesthesia. You should drink lots of liquid. Water is best. Try to drink at least °6-8 glasses of liquid daily. ° °Medications: °It is very important to prevent constipation after surgery. You should take a °stool softener, such as docusate sodium (Colace), twice a day for the first two °weeks. Also take a fiber supplement such as Benefiber, Citrucel, or Metamucil, °twice a day every day. Consult your pharmacist if you need help. If your stools °become loose, you can stop taking the softeners. ° °Take tylenol and ibuprofen as needed for pain control, alternating every 4-6 hours.  °Example:  °Tylenol 1000mg @ 6am, 12noon, 6pm, 12midnight (Do not exceed 4000mg of tylenol a day).  °Ibuprofen 800mg @ 9am, 3pm, 9pm, 3am (Do not exceed 3600mg of ibuprofen a day).  °Take Roxicodone for breakthrough pain every 4 hours.  °Drink plenty of water to also prevent constipation.  °Do not drive, operate machinery, or drink alcohol when taking narcotic pain medication.  °Within a few days, your pain should be sufficiently controlled with medications such as ibuprofen or acetaminophen. ° °Dressing: °You can replace your pad as needed after surgery. Feminine  pads work best.  °It is normal to see some bloody drainage on the dressing.  °Bleeding should not be heavy or continuous.  ° °Hygiene: °Keep the surgical area clean by taking Sitz baths (or tub baths) several times °per day. These are helpful for cleaning after each bowel movement. Frequent °showering is an alternative to baths, although many patients find baths °soothing after surgery. ° Equipment for Sitz baths can be purchased at many pharmacies or °medical/surgical supply stores. °o Use warm (not hot) water for cleansing. °o Pat the area dry or use a hairdryer to evaporate any residual moisture. °o If you use a hairdryer, use the cool or warm setting, to avoid potential °heat injury to the skin. ° Sitting on a pillow or ice pack may also provide relief. Do not apply ice °directly to the skin, use a towel or pillow case as a buffer. We do not °encourage sitting on an inflatable ring (“doughnut”) as this leads to °unopposed downward pressure in the anal area. ° You should gently rotate the Seton, every other day or so, to prevent it from °getting crusted with bodily fluids. ° Occasionally, the Seton may become displaced and fall out. If this occurs, it °is not an emergency. Please call our office the next business day so that we °may evaluate. ° °What symptoms should I expect? ° It is normal to have pain for up to 1-2 weeks. Thereafter, you may notice °discomfort with prolonged sitting and certain activities. Pain should not be °constant or worsening. ° ° Placement of Setons may stimulate mucus production so the volume of °drainage   you are having may increase at first. The volume of drainage °should lessen as healing occurs.  ° °

## 2018-11-08 NOTE — Anesthesia Preprocedure Evaluation (Signed)
Anesthesia Evaluation  Patient identified by MRN, date of birth, ID band Patient awake    Reviewed: Allergy & Precautions, NPO status , Patient's Chart, lab work & pertinent test results  Airway Mallampati: II  TM Distance: >3 FB Neck ROM: Full    Dental no notable dental hx. (+) Poor Dentition, Missing   Pulmonary sleep apnea and Continuous Positive Airway Pressure Ventilation ,    Pulmonary exam normal breath sounds clear to auscultation       Cardiovascular Exercise Tolerance: Good hypertension, Pt. on medications and Pt. on home beta blockers + angina with exertion + CAD and + Cardiac Stents  Normal cardiovascular examII Rhythm:Regular Rate:Normal  Denies recent CP or NTG use  States ET limited to Bilat Bad knees  MO S/p Stent DES LAD 09/01/17 Normal LV Fx   Neuro/Psych negative neurological ROS  negative psych ROS   GI/Hepatic Neg liver ROS, GERD  Medicated and Controlled,  Endo/Other  Morbid obesity  Renal/GU negative Renal ROS  negative genitourinary   Musculoskeletal negative musculoskeletal ROS (+)   Abdominal   Peds negative pediatric ROS (+)  Hematology negative hematology ROS (+)   Anesthesia Other Findings   Reproductive/Obstetrics negative OB ROS                             Anesthesia Physical Anesthesia Plan  ASA: III  Anesthesia Plan: General   Post-op Pain Management:    Induction: Intravenous  PONV Risk Score and Plan: 2 and Ondansetron, Midazolam and Treatment may vary due to age or medical condition  Airway Management Planned: Oral ETT  Additional Equipment:   Intra-op Plan:   Post-operative Plan: Extubation in OR  Informed Consent: I have reviewed the patients History and Physical, chart, labs and discussed the procedure including the risks, benefits and alternatives for the proposed anesthesia with the patient or authorized representative who has  indicated his/her understanding and acceptance.     Dental advisory given  Plan Discussed with: CRNA  Anesthesia Plan Comments: (Plan Full PPE use Plan GETA D/W PT -WTP with same after Q&A)        Anesthesia Quick Evaluation

## 2018-11-08 NOTE — Interval H&P Note (Signed)
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  The various methods of treatment have been discussed with the patient and family. After consideration of risks, benefits and other options for treatment, the patient has consented to  Procedure(s): ANAL FISTULOTOMY (N/A) as a surgical intervention.  The patient's history has been reviewed, patient examined, no change in status, stable for surgery.  I have reviewed the patient's chart and labs.  Questions were answered to the patient's satisfaction.    Same pain. No major changes.   Virl Cagey

## 2018-11-08 NOTE — Anesthesia Procedure Notes (Signed)
Procedure Name: Intubation Date/Time: 11/08/2018 9:50 AM Performed by: Hewitt Blade, CRNA Pre-anesthesia Checklist: Patient identified, Emergency Drugs available, Suction available and Patient being monitored Patient Re-evaluated:Patient Re-evaluated prior to induction Oxygen Delivery Method: Circle system utilized Preoxygenation: Pre-oxygenation with 100% oxygen Induction Type: IV induction, Rapid sequence and Cricoid Pressure applied Laryngoscope Size: Mac and 4 Grade View: Grade I Tube type: Oral Tube size: 7.5 mm Number of attempts: 1 Airway Equipment and Method: Stylet Placement Confirmation: ETT inserted through vocal cords under direct vision,  positive ETCO2 and breath sounds checked- equal and bilateral Secured at: 22 cm Tube secured with: Tape Dental Injury: Teeth and Oropharynx as per pre-operative assessment

## 2018-11-09 ENCOUNTER — Encounter (HOSPITAL_COMMUNITY): Payer: Self-pay | Admitting: General Surgery

## 2018-11-09 LAB — SURGICAL PATHOLOGY

## 2018-11-15 ENCOUNTER — Encounter: Payer: Self-pay | Admitting: General Surgery

## 2018-11-15 ENCOUNTER — Other Ambulatory Visit: Payer: Self-pay

## 2018-11-15 ENCOUNTER — Ambulatory Visit (INDEPENDENT_AMBULATORY_CARE_PROVIDER_SITE_OTHER): Payer: Self-pay | Admitting: General Surgery

## 2018-11-15 VITALS — BP 107/67 | HR 71 | Temp 98.5°F | Resp 20 | Ht 73.0 in | Wt 323.0 lb

## 2018-11-15 DIAGNOSIS — K603 Anal fistula: Secondary | ICD-10-CM

## 2018-11-15 DIAGNOSIS — K6289 Other specified diseases of anus and rectum: Secondary | ICD-10-CM

## 2018-11-15 MED ORDER — AMOXICILLIN-POT CLAVULANATE 875-125 MG PO TABS: 1.0000 | ORAL_TABLET | Freq: Two times a day (BID) | ORAL | 0 refills | Status: AC

## 2018-11-15 MED ORDER — OXYCODONE HCL 5 MG PO TABS: 5.0000 mg | ORAL_TABLET | ORAL | 0 refills | Status: DC | PRN

## 2018-11-15 NOTE — Patient Instructions (Signed)
Take antibiotic for possible infection in the perianal area.  Continue to care for setons as previously educated. Sitz baths for comfort. Keep stools soft and regular. If you do not hear from [facility], Dr. Leighton Ruff in the next week, let us know. Schedule your colonoscopy with Dr. Laural Golden.

## 2018-11-15 NOTE — Progress Notes (Signed)
[facility] Note   HPI:  68 y.o. 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 He had 4 setons placed in 4 different fistulas last week. He has been caring for them and doing Sitz baths.  He is still having pain and is having some drainage. On my exam last week there was no abscess to drain at the time of seton placement.    Review of Systems:  Pain around the anal area  Drainage All other review of systems: otherwise negative   Vital Signs:  BP 107/67 (BP Location: Left Arm, Patient Position: Sitting, Cuff Size: Normal)   Pulse 71   Temp 98.5 F (36.9 C) (Oral)   Resp 20   Ht 6\' 1"  (1.854 m)   Wt (!) 323 lb (146.5 kg)   SpO2 95%   BMI 42.61 kg/m    Physical Exam:  Physical Exam Vitals signs reviewed.  HENT:     Head: Normocephalic.  Cardiovascular:     Rate and Rhythm: Normal rate.  Pulmonary:     Effort: Pulmonary effort is normal.  Genitourinary:    Comments: 4 setons remain in place, right posterior seton with some drainage, and patient now more tender on right compared to left, setons moved easily  Neurological:     Mental Status: He is alert.      Assessment:  68 y.o. yo Male with a history of prostate radiation. He has these 4 fistulas and has been having rectal bleeding. He is getting scheduled for a colonoscopy, and I notified Dr. Laural Golden of the fistulas. He still does not have the colonoscopy scheduled. 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 He also could have some inflammatory bowel disease.  I am going to refer him to Colorectal surgery, and they would like to go to [place]. Given the drainage and the tenderness, I am going to do a round of antibiotics for potential continued infection around the fistula track. I appreciate no abscess or fluctuance to drain. His prior regimens were bactrim and doxycycline with his PCP.  The patient and his wife understand that the  setons will remain in place for the foreseeable future, and that these are going to hopefully prevent further abscess formation.   Plan:  Take Augmentin BID for possible infection in the perianal area.  Continue to care for setons as previously educated. I have refilled his pain medication, Roxicodone 5mg  q4 PRN (#30) Sitz baths for comfort. Keep stools soft and regular. If you do not hear from [facility], Dr. Leighton Ruff in the next week, let us know. Schedule your colonoscopy with Dr. Laural Golden.   All of the above recommendations were discussed with the patient and patient's family, and all of patient's and family's questions were answered to their expressed satisfaction.  Curlene Labrum, MD [facility] [address] [place], [place] [address] [phone] (office)

## 2018-11-16 ENCOUNTER — Other Ambulatory Visit (INDEPENDENT_AMBULATORY_CARE_PROVIDER_SITE_OTHER): Payer: Self-pay | Admitting: *Deleted

## 2018-11-16 ENCOUNTER — Encounter (INDEPENDENT_AMBULATORY_CARE_PROVIDER_SITE_OTHER): Payer: Self-pay | Admitting: *Deleted

## 2018-11-16 ENCOUNTER — Telehealth (INDEPENDENT_AMBULATORY_CARE_PROVIDER_SITE_OTHER): Payer: Self-pay | Admitting: *Deleted

## 2018-11-16 DIAGNOSIS — K921 Melena: Secondary | ICD-10-CM

## 2018-11-16 DIAGNOSIS — R109 Unspecified abdominal pain: Secondary | ICD-10-CM

## 2018-11-16 DIAGNOSIS — K625 Hemorrhage of anus and rectum: Secondary | ICD-10-CM

## 2018-11-16 MED ORDER — PEG 3350-KCL-NA BICARB-NACL 420 G PO SOLR: 4000.0000 mL | Freq: Once | ORAL | 0 refills | Status: AC

## 2018-11-16 NOTE — Telephone Encounter (Signed)
Patient needs trilyte TCS sch'd 12/3

## 2018-11-27 ENCOUNTER — Telehealth: Payer: Self-pay | Admitting: General Surgery

## 2018-11-27 MED ORDER — OXYCODONE HCL 5 MG PO TABS: 5.0000 mg | ORAL_TABLET | ORAL | 0 refills | Status: DC | PRN

## 2018-11-27 NOTE — Telephone Encounter (Signed)
[facility]  Patient requesting refill of pain meds. He has multiple anal fistulas and setons in place. Refill provided.  Colonoscopy in the upcoming weeks. Referral to colorectal in place.  Curlene Labrum, MD [facility] [address] [place], [place] [address] [id number] [phone] (office)

## 2018-11-30 ENCOUNTER — Telehealth: Payer: Self-pay

## 2018-11-30 MED ORDER — AMOXICILLIN-POT CLAVULANATE 875-125 MG PO TABS: 1.0000 | ORAL_TABLET | Freq: Two times a day (BID) | ORAL | 0 refills | Status: AC

## 2018-11-30 NOTE — Telephone Encounter (Signed)
[facility]  Patient feels like getting area that is swollen, worried about abscess. Sent in some Augmentin, and may need to go to ED if the swelling worsens. Sees Colorectal next week. Has multiple setons in place.  Curlene Labrum, MD [facility] [address] [place], [place] [address] [id number] [phone] (office)

## 2018-11-30 NOTE — Telephone Encounter (Signed)
Patients wife called to say that he has another abscess that has come up above the other area he was having problems with. Wanted to know if antibiotics were needed as he is not seeing Dr Marcello Moores until 12/05/2018.

## 2018-12-04 ENCOUNTER — Telehealth: Payer: Self-pay

## 2018-12-04 NOTE — Telephone Encounter (Signed)
Patients wife called asking for pain medication. Patient goes to [facility] but has run out of pain meds. MD notified.

## 2018-12-05 DIAGNOSIS — K603 Anal fistula: Secondary | ICD-10-CM | POA: Diagnosis not present

## 2018-12-06 ENCOUNTER — Other Ambulatory Visit: Payer: Self-pay | Admitting: General Surgery

## 2018-12-13 ENCOUNTER — Other Ambulatory Visit: Payer: Self-pay

## 2018-12-13 ENCOUNTER — Encounter: Payer: Self-pay | Admitting: General Surgery

## 2018-12-13 ENCOUNTER — Ambulatory Visit (INDEPENDENT_AMBULATORY_CARE_PROVIDER_SITE_OTHER): Payer: Self-pay | Admitting: General Surgery

## 2018-12-13 VITALS — BP 92/63 | HR 80 | Temp 98.3°F | Resp 18 | Ht 73.0 in | Wt 319.0 lb

## 2018-12-13 DIAGNOSIS — Z1339 Encounter for screening examination for other mental health and behavioral disorders: Secondary | ICD-10-CM | POA: Diagnosis not present

## 2018-12-13 DIAGNOSIS — Z6841 Body Mass Index (BMI) 40.0 and over, adult: Secondary | ICD-10-CM | POA: Diagnosis not present

## 2018-12-13 DIAGNOSIS — Z0000 Encounter for general adult medical examination without abnormal findings: Secondary | ICD-10-CM | POA: Diagnosis not present

## 2018-12-13 DIAGNOSIS — R5383 Other fatigue: Secondary | ICD-10-CM | POA: Diagnosis not present

## 2018-12-13 DIAGNOSIS — K605 Anorectal fistula: Secondary | ICD-10-CM | POA: Diagnosis not present

## 2018-12-13 DIAGNOSIS — Z299 Encounter for prophylactic measures, unspecified: Secondary | ICD-10-CM | POA: Diagnosis not present

## 2018-12-13 DIAGNOSIS — Z7189 Other specified counseling: Secondary | ICD-10-CM | POA: Diagnosis not present

## 2018-12-13 DIAGNOSIS — Z79899 Other long term (current) drug therapy: Secondary | ICD-10-CM | POA: Diagnosis not present

## 2018-12-13 DIAGNOSIS — K603 Anal fistula: Secondary | ICD-10-CM

## 2018-12-13 DIAGNOSIS — Z1211 Encounter for screening for malignant neoplasm of colon: Secondary | ICD-10-CM | POA: Diagnosis not present

## 2018-12-13 DIAGNOSIS — Z1331 Encounter for screening for depression: Secondary | ICD-10-CM | POA: Diagnosis not present

## 2018-12-13 DIAGNOSIS — K6289 Other specified diseases of anus and rectum: Secondary | ICD-10-CM

## 2018-12-13 DIAGNOSIS — E7800 Pure hypercholesterolemia, unspecified: Secondary | ICD-10-CM | POA: Diagnosis not present

## 2018-12-13 DIAGNOSIS — C61 Malignant neoplasm of prostate: Secondary | ICD-10-CM | POA: Diagnosis not present

## 2018-12-13 MED ORDER — OXYCODONE HCL 5 MG PO TABS: 5.0000 mg | ORAL_TABLET | ORAL | 0 refills | Status: DC | PRN

## 2018-12-13 NOTE — Patient Instructions (Signed)
Continue sitz baths. Continue to rotate the setons. Colonoscopy with Dr. Laural Golden. Keep appointment with Dr. Marcello Moores.

## 2018-12-13 NOTE — Progress Notes (Signed)
[facility] Note   HPI:  68 y.o. Male presents to clinic for follow-up evaluation of his anal setons. He has seen Dr. Marcello Moores and is going to get his colonoscopy in a few weeks. He says he continues to have extreme pain in his anus. He is out of the pain medication. Dr. Marcello Moores sent her notes and she thinks he will need to stay on chronic narcotics for now.  The patient is having BMs and doing his Sitz baths. He is only relieved from the pain with the narcotic pain medication.  Review of Systems:  Anal pain Setons in place Some drainage  All other review of systems: otherwise negative   Vital Signs:  BP 92/63 (BP Location: Left Arm, Patient Position: Sitting, Cuff Size: Large)   Pulse 80   Temp 98.3 F (36.8 C) (Oral)   Resp 18   Ht 6\' 1"  (1.854 m)   Wt (!) 319 lb (144.7 kg)   SpO2 92%   BMI 42.09 kg/m    Physical Exam:  Physical Exam Cardiovascular:     Rate and Rhythm: Normal rate.  Pulmonary:     Effort: Pulmonary effort is normal.  Genitourinary:    Comments: 4 setons remain in place, 2 going anterior and 2 going posterior, tender around the anal region, no redness or drainage, setons easily moved  Neurological:     Mental Status: He is alert.      Assessment:  68 y.o. yo Male with setons in place for multiple anal fistulas and a history of prostate radiation. Dr. 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 She thinks he will need to stay on narcotics, and I agree due to the pain he is having.  Colonoscopy with Dr. Laural Golden in the upcoming weeks to hopefully rule out anything else like Crohn's.   Plan:  Continue sitz baths. Continue to rotate the setons. Colonoscopy with Dr. Laural Golden. Keep appointment with Dr. Marcello Moores.  Prescription for Roxicodone 5-10 mg q 4 PRN #60 written for patient and will continue to refill until Dr. Marcello Moores performs surgery Will continue to follow patient  and prescribe narcotics until care taken over by Dr. Marcello Moores, appreciate her help with this challenging patient   Future Appointments  Date Time Provider [place]  12/19/2018  1:00 PM AP-SCREENING [facility] None  01/10/2019  1:15 PM Virl Cagey, MD [facility] None     All of the above recommendations were discussed with the patient and patient's family, and all of patient's and family's questions were answered to their expressed satisfaction.  Curlene Labrum, MD [facility] [address] [place], [place] [address] [phone] (office)

## 2018-12-19 ENCOUNTER — Other Ambulatory Visit: Payer: Self-pay

## 2018-12-19 ENCOUNTER — Other Ambulatory Visit (HOSPITAL_COMMUNITY)
Admission: RE | Admit: 2018-12-19 | Discharge: 2018-12-19 | Disposition: A | Discharge status: Home / Self Care | Payer: Medicare HMO | Source: Ambulatory Visit | Attending: Internal Medicine | Admitting: Internal Medicine

## 2018-12-19 DIAGNOSIS — Z20828 Contact with and (suspected) exposure to other viral communicable diseases: Secondary | ICD-10-CM | POA: Insufficient documentation

## 2018-12-19 DIAGNOSIS — Z01812 Encounter for preprocedural laboratory examination: Secondary | ICD-10-CM | POA: Insufficient documentation

## 2018-12-19 LAB — SARS CORONAVIRUS 2 (TAT 6-24 HRS): SARS Coronavirus 2: NEGATIVE

## 2018-12-21 ENCOUNTER — Ambulatory Visit (HOSPITAL_COMMUNITY)
Admission: RE | Admit: 2018-12-21 | Discharge: 2018-12-21 | Disposition: A | Discharge status: Home / Self Care | Payer: Medicare HMO | Attending: Internal Medicine | Admitting: Internal Medicine

## 2018-12-21 ENCOUNTER — Other Ambulatory Visit: Payer: Self-pay

## 2018-12-21 ENCOUNTER — Encounter (HOSPITAL_COMMUNITY)
Admission: RE | Disposition: A | Discharge status: Home / Self Care | Payer: Self-pay | Source: Home / Self Care | Attending: Internal Medicine

## 2018-12-21 DIAGNOSIS — Z923 Personal history of irradiation: Secondary | ICD-10-CM | POA: Diagnosis not present

## 2018-12-21 DIAGNOSIS — Z539 Procedure and treatment not carried out, unspecified reason: Secondary | ICD-10-CM

## 2018-12-21 DIAGNOSIS — I10 Essential (primary) hypertension: Secondary | ICD-10-CM | POA: Insufficient documentation

## 2018-12-21 DIAGNOSIS — Z8546 Personal history of malignant neoplasm of prostate: Secondary | ICD-10-CM | POA: Insufficient documentation

## 2018-12-21 DIAGNOSIS — Z955 Presence of coronary angioplasty implant and graft: Secondary | ICD-10-CM | POA: Diagnosis not present

## 2018-12-21 DIAGNOSIS — I2510 Atherosclerotic heart disease of native coronary artery without angina pectoris: Secondary | ICD-10-CM | POA: Diagnosis not present

## 2018-12-21 DIAGNOSIS — K6289 Other specified diseases of anus and rectum: Secondary | ICD-10-CM | POA: Insufficient documentation

## 2018-12-21 DIAGNOSIS — G4730 Sleep apnea, unspecified: Secondary | ICD-10-CM | POA: Diagnosis not present

## 2018-12-21 DIAGNOSIS — K921 Melena: Secondary | ICD-10-CM | POA: Diagnosis not present

## 2018-12-21 DIAGNOSIS — K603 Anal fistula: Secondary | ICD-10-CM | POA: Diagnosis not present

## 2018-12-21 DIAGNOSIS — K5520 Angiodysplasia of colon without hemorrhage: Secondary | ICD-10-CM | POA: Diagnosis not present

## 2018-12-21 DIAGNOSIS — Z6841 Body Mass Index (BMI) 40.0 and over, adult: Secondary | ICD-10-CM | POA: Insufficient documentation

## 2018-12-21 DIAGNOSIS — Z79899 Other long term (current) drug therapy: Secondary | ICD-10-CM | POA: Insufficient documentation

## 2018-12-21 DIAGNOSIS — Z538 Procedure and treatment not carried out for other reasons: Secondary | ICD-10-CM | POA: Diagnosis not present

## 2018-12-21 DIAGNOSIS — R109 Unspecified abdominal pain: Secondary | ICD-10-CM

## 2018-12-21 DIAGNOSIS — Z7982 Long term (current) use of aspirin: Secondary | ICD-10-CM | POA: Insufficient documentation

## 2018-12-21 DIAGNOSIS — E785 Hyperlipidemia, unspecified: Secondary | ICD-10-CM | POA: Diagnosis not present

## 2018-12-21 HISTORY — PX: COLONOSCOPY: SHX5424

## 2018-12-21 SURGERY — COLONOSCOPY
Anesthesia: Moderate Sedation

## 2018-12-21 MED ORDER — LIDOCAINE HCL URETHRAL/MUCOSAL 2 % EX GEL
CUTANEOUS | Status: DC | PRN
  Administered 2018-12-21: 1 via TOPICAL

## 2018-12-21 MED ORDER — LIDOCAINE HCL URETHRAL/MUCOSAL 2 % EX GEL
CUTANEOUS | Status: AC
  Filled 2018-12-21: qty 30

## 2018-12-21 MED ORDER — SODIUM CHLORIDE 0.9 % IV SOLN
INTRAVENOUS | Status: DC
  Administered 2018-12-21: 13:00:00 via INTRAVENOUS

## 2018-12-21 MED ORDER — MEPERIDINE HCL 50 MG/ML IJ SOLN
INTRAMUSCULAR | Status: DC | PRN
  Administered 2018-12-21 (×4): 25 mg via INTRAVENOUS

## 2018-12-21 MED ORDER — MEPERIDINE HCL 50 MG/ML IJ SOLN
INTRAMUSCULAR | Status: AC
  Filled 2018-12-21: qty 1

## 2018-12-21 MED ORDER — STERILE WATER FOR IRRIGATION IR SOLN
Status: DC | PRN
  Administered 2018-12-21: 13:00:00 1.5 mL

## 2018-12-21 MED ORDER — MIDAZOLAM HCL 5 MG/5ML IJ SOLN
INTRAMUSCULAR | Status: DC | PRN
  Administered 2018-12-21 (×5): 2 mg via INTRAVENOUS

## 2018-12-21 MED ORDER — MIDAZOLAM HCL 5 MG/5ML IJ SOLN
INTRAMUSCULAR | Status: AC
  Filled 2018-12-21: qty 5

## 2018-12-21 MED ORDER — MIDAZOLAM HCL 5 MG/5ML IJ SOLN
INTRAMUSCULAR | Status: AC
  Filled 2018-12-21: qty 10

## 2018-12-21 NOTE — H&P (Signed)
Phillip Rios is an 68 y.o. male.   Chief Complaint: Patient is here for colonoscopy. HPI: Patient is 68 year old Caucasian male who presents with intermittent lower abdominal pain and hematochezia.  He underwent I&D for perianal abscess back in June 2020.  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 Constance Haw.  He had seton placement at the time of examination anesthesia by her about 6 weeks ago.  He remains with perianal pain.  He has 2-3 formed stools daily.  Last colonoscopy was in May 2016 with removal of small rectal polyp and was inflammatory.  Family history is negative for CRC or inflammatory bowel disease.  There is concern for Crohn's disease given perianal fistulae. Past history significant for prostate carcinoma for which she had radiation therapy.  Past Medical History:  Diagnosis Date  . Cancer [facility])    prostate completed radiation 06/08/17  . Coronary artery disease    non obstructive  . Dyslipidemia   . GERD (gastroesophageal reflux disease)   . Hypertension   . Morbid obesity ([place])   . Sleep apnea    USES CPAP    Past Surgical History:  Procedure Laterality Date  . CARDIAC CATHETERIZATION     cath  . COLONOSCOPY    . CORONARY STENT INTERVENTION  09/01/2017  . CORONARY STENT INTERVENTION N/A 09/01/2017   Procedure: CORONARY STENT INTERVENTION;  Surgeon: Burnell Blanks, MD;  Location: [place] [facility];  Service: Cardiovascular;  Laterality: N/A;  . INTRAVASCULAR PRESSURE WIRE/FFR STUDY N/A 09/01/2017   Procedure: INTRAVASCULAR PRESSURE WIRE/FFR STUDY;  Surgeon: Burnell Blanks, MD;  Location: [place];  Service: Cardiovascular;  Laterality: N/A;  . LEFT HEART CATH AND CORONARY ANGIOGRAPHY N/A 09/01/2017   Procedure: LEFT HEART CATH AND CORONARY ANGIOGRAPHY;  Surgeon: Burnell Blanks, MD;  Location: [place];  Service: Cardiovascular;  Laterality: N/A;  . PLACEMENT OF SETON  11/08/2018   Procedure: PLACEMENT OF  SETON X 4 IN TRANSPHINCTERIC FISTULA;  Surgeon: Virl Cagey, MD;  Location: [facility];  Service: General;;    Family History  Problem Relation Age of Onset  . Heart failure Father   . Heart disease Father    Social History:  reports that he has never smoked. His smokeless tobacco use includes chew. He reports that he does not drink alcohol or use drugs.  Allergies:  Allergies  Allergen Reactions  . Ibuprofen Other (See Comments)    Medications Prior to Admission  Medication Sig Dispense Refill  . acetaminophen (TYLENOL) 500 MG tablet Take 500 mg by mouth every 6 (six) hours as needed for moderate pain or headache.    Marland Kitchen amLODipine (NORVASC) 5 MG tablet Take 5 mg by mouth daily.    Marland Kitchen aspirin EC 81 MG tablet Take 81 mg by mouth daily.    Marland Kitchen docusate sodium (COLACE) 100 MG capsule Take 200 mg by mouth daily.    . hydrochlorothiazide (HYDRODIURIL) 25 MG tablet Take 25 mg by mouth daily.    Marland Kitchen lisinopril (PRINIVIL,ZESTRIL) 20 MG tablet Take 40 mg by mouth daily.    . metoprolol succinate (TOPROL-XL) 50 MG 24 hr tablet Take 25 mg by mouth daily.    Marland Kitchen oxyCODONE (ROXICODONE) 5 MG immediate release tablet Take 1-2 tablets (5-10 mg total) by mouth every 4 (four) hours as needed for severe pain or breakthrough pain. 60 tablet 0  . pantoprazole (PROTONIX) 40 MG tablet Take 40 mg by mouth daily.  2  .  tamsulosin (FLOMAX) 0.4 MG CAPS capsule Take 0.4 mg by mouth at bedtime.     Marland Kitchen atorvastatin (LIPITOR) 80 MG tablet Take 1 tablet (80 mg total) by mouth daily at 6 PM. 90 tablet 3  . nitroGLYCERIN (NITROSTAT) 0.4 MG SL tablet Place 1 tablet (0.4 mg total) under the tongue every 5 (five) minutes as needed for chest pain. 25 tablet 2    No results found for this or any previous visit (from the past 48 hour(s)). No results found.  ROS  Blood pressure 126/67, pulse 77, temperature 98.4 F (36.9 C), temperature source Oral, resp. rate 15, height 6\' 1"  (1.854 m), weight (!) 144.2 kg, SpO2 98  %. Physical Exam  Constitutional: He appears well-developed and well-nourished.  HENT:  Mouth/Throat: Oropharynx is clear and moist.  He has a rhinophyma  Eyes: Conjunctivae are normal. No scleral icterus.  Neck: No thyromegaly present.  Cardiovascular: Normal rate, regular rhythm and normal heart sounds.  No murmur heard. Respiratory: Effort normal and breath sounds normal.  GI:  Abdomen is full.  On palpation is soft.  He has mild tenderness at LLQ.  No organomegaly or masses  Musculoskeletal:        General: No edema.  Lymphadenopathy:    He has no cervical adenopathy.  Neurological: He is alert.  Skin: Skin is warm and dry.     Assessment/Plan History of rectal bleeding. Perianal fistulae. Diagnostic colonoscopy.  Hildred Laser, MD 12/21/2018, 1:16 PM

## 2018-12-21 NOTE — Discharge Instructions (Signed)
Resume usual medications including aspirin as before Resume usual diet. No driving for 24 hours. Follow-up with Dr. Constance Haw as scheduled.   Colonoscopy, Adult, Care After This sheet gives you information about how to care for yourself after your procedure. Your doctor may also give you more specific instructions. If you have problems or questions, call your doctor. What can I expect after the procedure? After the procedure, it is common to have:  A small amount of blood in your poop for 24 hours.  Some gas.  Mild cramping or bloating in your belly. Follow these instructions at home: General instructions  For the first 24 hours after the procedure: ? Do not drive or use machinery. ? Do not sign important documents. ? Do not drink alcohol. ? Do your daily activities more slowly than normal. ? Eat foods that are soft and easy to digest.  Take over-the-counter or prescription medicines only as told by your doctor. To help cramping and bloating:   Try walking around.  Put heat on your belly (abdomen) as told by your doctor. Use a heat source that your doctor recommends, such as a moist heat pack or a heating pad. ? Put a towel between your skin and the heat source. ? Leave the heat on for 20-30 minutes. ? Remove the heat if your skin turns bright red. This is especially important if you cannot feel pain, heat, or cold. You can get burned. Eating and drinking   Drink enough fluid to keep your pee (urine) clear or pale yellow.  Return to your normal diet as told by your doctor. Avoid heavy or fried foods that are hard to digest.  Avoid drinking alcohol for as long as told by your doctor. Contact a doctor if:  You have blood in your poop (stool) 2-3 days after the procedure. Get help right away if:  You have more than a small amount of blood in your poop.  You see large clumps of tissue (blood clots) in your poop.  Your belly is swollen.  You feel sick to your stomach  (nauseous).  You throw up (vomit).  You have a fever.  You have belly pain that gets worse, and medicine does not help your pain. Summary  After the procedure, it is common to have a small amount of blood in your poop. You may also have mild cramping and bloating in your belly.  For the first 24 hours after the procedure, do not drive or use machinery, do not sign important documents, and do not drink alcohol.  Get help right away if you have a lot of blood in your poop, feel sick to your stomach, have a fever, or have more belly pain. This information is not intended to replace advice given to you by your health care provider. Make sure you discuss any questions you have with your health care provider. Document Released: 02/06/2010 Document Revised: 11/04/2016 Document Reviewed: 09/29/2015 Elsevier Patient Education  2020 [employer].  Diverticulosis  Diverticulosis is a condition that develops when small pouches (diverticula) form in the wall of the large intestine (colon). The colon is where water is absorbed and stool is formed. The pouches form when the inside layer of the colon pushes through weak spots in the outer layers of the colon. You may have a few pouches or many of them. What are the causes? The cause of this condition is not known. What increases the risk? The following factors may make you more likely to develop  this condition:  Being older than age 24. Your risk for this condition increases with age. Diverticulosis is rare among people younger than age 45. By age 80, many people have it.  Eating a low-fiber diet.  Having frequent constipation.  Being overweight.  Not getting enough exercise.  Smoking.  Taking over-the-counter pain medicines, like aspirin and ibuprofen.  Having a family history of diverticulosis. What are the signs or symptoms? In most people, there are no symptoms of this condition. If you do have symptoms, they may  include:  Bloating.  Cramps in the abdomen.  Constipation or diarrhea.  Pain in the lower left side of the abdomen. How is this diagnosed? This condition is most often diagnosed during an exam for other colon problems. Because diverticulosis usually has no symptoms, it often cannot be diagnosed independently. This condition may be diagnosed by:  Using a flexible scope to examine the colon (colonoscopy).  Taking an X-ray of the colon after dye has been put into the colon (barium enema).  Doing a CT scan. How is this treated? You may not need treatment for this condition if you have never developed an infection related to diverticulosis. If you have had an infection before, treatment may include:  Eating a high-fiber diet. This may include eating more fruits, vegetables, and grains.  Taking a fiber supplement.  Taking a live bacteria supplement (probiotic).  Taking medicine to relax your colon.  Taking antibiotic medicines. Follow these instructions at home:  Drink 6-8 glasses of water or more each day to prevent constipation.  Try not to strain when you have a bowel movement.  If you have had an infection before: ? Eat more fiber as directed by your health care provider or your diet and nutrition specialist (dietitian). ? Take a fiber supplement or probiotic, if your health care provider approves.  Take over-the-counter and prescription medicines only as told by your health care provider.  If you were prescribed an antibiotic, take it as told by your health care provider. Do not stop taking the antibiotic even if you start to feel better.  Keep all follow-up visits as told by your health care provider. This is important. Contact a health care provider if:  You have pain in your abdomen.  You have bloating.  You have cramps.  You have not had a bowel movement in 3 days. Get help right away if:  Your pain gets worse.  Your bloating becomes very bad.  You have a  fever or chills, and your symptoms suddenly get worse.  You vomit.  You have bowel movements that are bloody or black.  You have bleeding from your rectum. Summary  Diverticulosis is a condition that develops when small pouches (diverticula) form in the wall of the large intestine (colon).  You may have a few pouches or many of them.  This condition is most often diagnosed during an exam for other colon problems.  If you have had an infection related to diverticulosis, treatment may include increasing the fiber in your diet, taking supplements, or taking medicines. This information is not intended to replace advice given to you by your health care provider. Make sure you discuss any questions you have with your health care provider. Document Released: 10/02/2003 Document Revised: 12/17/2016 Document Reviewed: 11/24/2015 Elsevier Patient Education  2020 [employer].

## 2018-12-21 NOTE — Op Note (Signed)
[facility] Patient Name: Phillip Rios Procedure Date: 12/21/2018 12:14 PM MRN: [id number] Date of Birth: 02-13-1950 Attending MD: Hildred Laser , MD CSN: [id number] Age: 68 Admit Type: Outpatient Procedure:                Colonoscopy Indications:              Hematochezia and perianal fistulae Providers:                Hildred Laser, MD, Charlsie Quest. Theda Sers RN, RN,                            Randa Spike, Technician Referring MD:             Curlene Labrum, MD and Dr. Monico Blitz, MD Medicines:                Meperidine 100 mg IV, Midazolam 10 mg IV Complications:            No immediate complications. Estimated Blood Loss:     Estimated blood loss: none. Procedure:                Pre-Anesthesia Assessment:                           - Prior to the procedure, a History and Physical                            was performed, and patient medications and                            allergies were reviewed. The patient's tolerance of                            previous anesthesia was also reviewed. The risks                            and benefits of the procedure and the sedation                            options and risks were discussed with the patient.                            All questions were answered, and informed consent                            was obtained. Prior Anticoagulants: The patient has                            taken no previous anticoagulant or antiplatelet                            agents except for aspirin. ASA Grade Assessment:                            III - A patient with severe systemic disease. After  reviewing the risks and benefits, the patient was                            deemed in satisfactory condition to undergo the                            procedure.                           After obtaining informed consent, the colonoscope                            was passed under direct vision. Throughout the                 procedure, the patient's blood pressure, pulse, and                            oxygen saturations were monitored continuously. The                            [id number]-[id number] scope was introduced through                            the anus and advanced to the the hepatic flexure.                            The colonoscopy was somewhat difficult due to the                            patient's excessive discomfort during the                            procedure. The patient tolerated the procedure                            well. The quality of the bowel preparation was                            good. The rectum was photographed. Scope In: 1:36:37 PM Scope Out: 1:51:21 PM Total Procedure Duration: 0 hours 14 minutes 44 seconds  Findings:      Incomplete exam to hepatic flexure.      The perianal exam findings include perianal fistula with 4 setons in       place.      The digital rectal exam findings include anal tenderness and perianal       fistula. Pertinent negatives include no anal lesion or abnormality.      The recto-sigmoid colon, sigmoid colon, descending colon, splenic       flexure, transverse colon and hepatic flexure appeared normal.      A few small angiodysplastic lesions without bleeding were found in the       distal rectum.      Anal papilla(e) were hypertrophied. 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                           - Anal tenderness found on digital rectal exam.                           - The recto-sigmoid colon, sigmoid colon,                            descending colon, splenic flexure, transverse colon                            and hepatic flexure are normal.                           - A few non-bleeding colonic angiodysplastic                            lesions.                           - Anal papilla(e) were hypertrophied.                           -  No specimens collected.                           Comment: did not attempt to complete exam as                            patient continued to experience anal pain despite                            using slim scope.                           no evidence of rectal Crohn's.                           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 Moderate Sedation:      Moderate (conscious) sedation was administered by the endoscopy nurse       and supervised by the endoscopist. The following parameters were       monitored: oxygen saturation, heart rate, blood pressure, CO2       capnography and response to care. Total physician intraservice time was       28 minutes. Recommendation:           - Patient has a contact number available for                            emergencies. The signs and symptoms of potential                            delayed complications were discussed with the                            patient. Return to  normal activities tomorrow.                            Written discharge instructions were provided to the                            patient.                           - Resume previous diet today.                           - Patient has a contact number available for                            emergencies. The signs and symptoms of potential                            delayed complications were discussed with the                            patient. Return to normal activities tomorrow.                            Written discharge instructions were provided to the                            patient.                           - Continue present medications.                           - Repeat colonoscopy in 5 years for screening                            purposes.                           - follow-up with [facility] in 3 weeks(scheduled                            visit). Procedure Code(s):        --- Professional ---                           [phone], 53,  Colonoscopy, flexible; diagnostic,                            including collection of specimen(s) by brushing or                            washing, when performed (separate procedure)                           99153, Moderate sedation; each additional 15  minutes intraservice time                           G0500, Moderate sedation services provided by the                            same physician or other qualified health care                            professional performing a gastrointestinal                            endoscopic service that sedation supports,                            requiring the presence of an independent trained                            observer to assist in the monitoring of the                            patient's level of consciousness and physiological                            status; initial 15 minutes of intra-service time;                            patient age 24 years or older (additional time may                            be reported with [phone], as appropriate) Diagnosis Code(s):        --- Professional ---                           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 All rights reserved. The codes documented in this report are preliminary and upon coder review may  be revised to meet current compliance requirements. Hildred Laser, MD Hildred Laser, MD 12/21/2018 2:09:19 PM This report has been signed electronically. Number of Addenda: 0

## 2018-12-25 ENCOUNTER — Encounter (HOSPITAL_COMMUNITY): Payer: Self-pay | Admitting: Internal Medicine

## 2018-12-25 ENCOUNTER — Telehealth: Payer: Self-pay | Admitting: General Surgery

## 2018-12-25 DIAGNOSIS — K603 Anal fistula: Secondary | ICD-10-CM

## 2018-12-25 DIAGNOSIS — K6289 Other specified diseases of anus and rectum: Secondary | ICD-10-CM

## 2018-12-25 MED ORDER — HYDROCORTISONE ACETATE 25 MG RE SUPP: 25.0000 mg | Freq: Two times a day (BID) | RECTAL | 0 refills | Status: DC

## 2018-12-25 NOTE — Telephone Encounter (Signed)
[facility]  Dr. Marcello Moores sent me a message. She actually wants patient to try and get off the narcotic. He says he takes about 6 a day and the only way to get the pain to improve is the narcotic or laying down. His worse pain is with defecation, and although I saw no obvious fissure I am sure he is somewhat spasmed due to the setons and inflammation.    Dr. Laural Golden did see changes of radiation proctitis. I discussed option of steroid suppository with Dr. Marcello Moores and she thinks it is reasonable to see if this could aid in the patient's pain.   Will start with hydrocortisone 25 mg BID suppository now for 14 days.  If pain with defecation continues, we could always try a diltiazem/ lidocaine fissure type ointment and see if that aids him in anyway.  Will see him back 12/24.   Curlene Labrum, MD [facility] [address] [place], [place] [address] [phone] (office)

## 2018-12-26 DIAGNOSIS — R69 Illness, unspecified: Secondary | ICD-10-CM | POA: Diagnosis not present

## 2018-12-27 ENCOUNTER — Telehealth: Payer: Self-pay

## 2018-12-27 NOTE — Telephone Encounter (Signed)
Patient's wife called stating that they were unable to fill prescription for suppositories as they were not covered by insurance and costs [phone]. MD notified.

## 2018-12-28 MED ORDER — HYDROCORTISONE ACETATE 25 MG RE SUPP: 25.0000 mg | Freq: Two times a day (BID) | RECTAL | 0 refills | Status: AC

## 2018-12-28 MED ORDER — HYDROCORT-PRAMOXINE (PERIANAL) 1-1 % EX FOAM: 1.0000 | Freq: Three times a day (TID) | CUTANEOUS | 0 refills | Status: DC

## 2018-12-28 NOTE — Telephone Encounter (Signed)
[name]   Sent in proctofoam [facility] to see if this is cheaper for the patient. If we have to we can always reduce the quantity of the hydrocortisone suppositories to make it more affordable.  Curlene Labrum, MD [facility] [address] [place], [place] [address] [phone] (office)

## 2018-12-29 ENCOUNTER — Other Ambulatory Visit: Payer: Self-pay | Admitting: General Surgery

## 2018-12-29 DIAGNOSIS — K603 Anal fistula: Secondary | ICD-10-CM

## 2019-01-03 ENCOUNTER — Other Ambulatory Visit: Payer: Self-pay | Admitting: General Surgery

## 2019-01-04 DIAGNOSIS — I10 Essential (primary) hypertension: Secondary | ICD-10-CM | POA: Diagnosis not present

## 2019-01-04 DIAGNOSIS — Z299 Encounter for prophylactic measures, unspecified: Secondary | ICD-10-CM | POA: Diagnosis not present

## 2019-01-04 DIAGNOSIS — K605 Anorectal fistula: Secondary | ICD-10-CM | POA: Diagnosis not present

## 2019-01-04 DIAGNOSIS — Z6841 Body Mass Index (BMI) 40.0 and over, adult: Secondary | ICD-10-CM | POA: Diagnosis not present

## 2019-01-10 ENCOUNTER — Ambulatory Visit: Payer: Self-pay | Admitting: General Surgery

## 2019-01-10 ENCOUNTER — Other Ambulatory Visit: Payer: Self-pay

## 2019-01-10 ENCOUNTER — Encounter: Payer: Self-pay | Admitting: General Surgery

## 2019-01-10 ENCOUNTER — Ambulatory Visit (INDEPENDENT_AMBULATORY_CARE_PROVIDER_SITE_OTHER): Payer: Self-pay | Admitting: General Surgery

## 2019-01-10 VITALS — BP 124/78 | HR 79 | Temp 98.5°F | Resp 18 | Ht 73.0 in | Wt 319.0 lb

## 2019-01-10 DIAGNOSIS — K6289 Other specified diseases of anus and rectum: Secondary | ICD-10-CM

## 2019-01-10 DIAGNOSIS — K603 Anal fistula: Secondary | ICD-10-CM

## 2019-01-10 MED ORDER — OXYCODONE HCL 5 MG PO TABS: 5.0000 mg | ORAL_TABLET | ORAL | 0 refills | Status: DC | PRN

## 2019-01-10 NOTE — Patient Instructions (Addendum)
Diltiazem 2%/ Lidocaine 5% to the anal area four times as needed.   Get from [employer].  Sitz baths with BMs and for comfort.

## 2019-01-10 NOTE — Progress Notes (Signed)
[facility] Note   HPI:  68 y.o. Male presents to clinic for follow-up evaluation of his fistula in ano. He was finally able to pain for the steroid suppositories for radiation proctitis to see if this helped his pain. He says he is able to do them some but that he has severe pain on occasion. His pain is mostly with BM and he feels a tearing / sharp pain. He has multiple setons in place. He has been still requiring narcotics and takes these when he must and mostly at night. He has been having to stay in bed a lot.   Review of Systems:  Some drainage Rectal pain worse with BM/ sharp and tearing in nature All other review of systems: otherwise negative   Vital Signs:  BP 124/78 (BP Location: Left Arm, Patient Position: Sitting, Cuff Size: Large)   Pulse 79   Temp 98.5 F (36.9 C) (Oral)   Resp 18   Ht 6\' 1"  (1.854 m)   Wt (!) 319 lb (144.7 kg)   SpO2 93%   BMI 42.09 kg/m    Physical Exam:  Physical Exam HENT:     Head: Normocephalic.     Nose: Nose normal.  Pulmonary:     Effort: Pulmonary effort is normal.  Genitourinary:    Comments: Perianal region with multiple setons in place, moved easily, right anterior seton with hypertrophic granulation on entry, some drainage with palpation and induration/ tenderness, anal region extremely tender,no obvious fissure Neurological:     Mental Status: He is alert.      Assessment:  68 y.o. yo Male with multiple fistula in ano. Plans for MRI and follow up with Dr. Marcello Moores after. The MRI is 02/03/19. He has tried the suppositories to see if this helps the pain but with little difference. Most of the pain is with Bms and after BMs. He does regular sitz baths. He stays in bed most days and is unable to sit on his bottom.  He has to use some narcotic to get relief but is trying to use as little as possible. Discussed pathophysiology of a fissure and that he does not obviously have one but that he does have multiple setons  cutting through his anal sphincter and I am sure spasm and pain related to these and the fistulas.  I have offered Diltiazem 2% /Lidocaine 5% compound the same we would use for a fissure to see if this helps. He is on a bowel regimen and has regular soft stools.   Plan:  - Diltiazem 2% /Lidocaine 5% up to four times daily called into [facility] 5 mg q4 PRN (#20 ) prescribed to the patient. He has been using the pain medication minimally but still requires some. He and his wife are aware of my limitations in prescribing for him and the concern for him having a higher tolerance and addiction.  He mostly reports using at night or with Bms.  I am hopeful that the diltiazem will help with this spasm pain.  - Continue appts with Dr. Marcello Moores - Call with issues or concerns between now and then   All of the above recommendations were discussed with the patient and patient's family, and all of patient's and family's questions were answered to their expressed satisfaction.  Curlene Labrum, MD [facility] [address] [place], [place] [address] [phone] (office)

## 2019-02-03 ENCOUNTER — Other Ambulatory Visit: Payer: Self-pay

## 2019-02-03 ENCOUNTER — Ambulatory Visit
Admission: RE | Admit: 2019-02-03 | Discharge: 2019-02-03 | Disposition: A | Discharge status: Home / Self Care | Payer: Medicare HMO | Source: Ambulatory Visit | Attending: General Surgery | Admitting: General Surgery

## 2019-02-03 DIAGNOSIS — K603 Anal fistula: Secondary | ICD-10-CM | POA: Diagnosis not present

## 2019-02-03 MED ORDER — GADOBENATE DIMEGLUMINE 529 MG/ML IV SOLN
20.0000 mL | Freq: Once | INTRAVENOUS | Status: AC | PRN
  Administered 2019-02-03: 20 mL via INTRAVENOUS

## 2019-02-09 DIAGNOSIS — M170 Bilateral primary osteoarthritis of knee: Secondary | ICD-10-CM | POA: Diagnosis not present

## 2019-02-13 ENCOUNTER — Ambulatory Visit: Payer: Self-pay | Admitting: General Surgery

## 2019-02-13 DIAGNOSIS — K603 Anal fistula: Secondary | ICD-10-CM | POA: Diagnosis not present

## 2019-02-13 NOTE — H&P (Signed)
History of Present Illness Leighton Ruff MD; 99991111 11:13 AM) The patient is a 69 year old male who presents with anal fistula. 69 year old male who presents to the office for evaluation of multiple fistulas seen on most recent exam under anesthesia. He states that last summer he developed an abscess which was drained at [facility]. Since then, he has had trouble with pain and recurrent abscesses. He was taken to the operating room with Dr. Constance Haw in [place] and was noted to have multiple fistula tracts involving the sphincter complex. Setons were placed. He was scheduled for a colonoscopy to rule out Crohn's disease. A biopsy was also obtained. This showed dense inflammatory tissue with granulation consistent with a fistula tract. Patient does have a history of prostate radiation. He states that he has multiple loose stools throughout the day, but this is not a new issue for him. Colonoscopy did not show any signs of Crohn's disease. He has had continued pain after surgery. Diltiazem ointment has helped the most with that.    Problem List/Past Medical Leighton Ruff, MD; 99991111 11:11 AM) ANAL FISTULA (K60.3)  Past Surgical History Leighton Ruff, MD; 99991111 11:11 AM) Orpah Cobb Fissure Repair  Diagnostic Studies History Leighton Ruff, MD; 99991111 11:11 AM) Colonoscopy 1-5 years ago  Allergies Mammie Lorenzo, LPN; QA348G 075-GRM AM) No Known Drug Allergies [12/05/2018]:  Medication History Mammie Lorenzo, LPN; QA348G 075-GRM AM) Pantoprazole Sodium (40MG  Tablet DR, Oral) Active. oxyCODONE HCl (5MG  Tablet, Oral) Active. Tylenol (500MG  Capsule, Oral) Active. Norvasc (5MG  Tablet, Oral) Active. Aspirin (81MG  Tablet, Oral) Active. Colace (100MG  Capsule, Oral) Active. hydroCHLOROthiazide (25MG  Tablet, Oral) Active. Medications Reconciled  Social History Leighton Ruff, MD; 99991111 11:11 AM) Alcohol use Remotely quit alcohol use. Caffeine use  Tea. Tobacco use Never smoker.  Family History Leighton Ruff, MD; 99991111 11:11 AM) Arthritis Brother, Father, Mother, Sister. Diabetes Mellitus Mother. Heart Disease Brother, Father, Mother. Hypertension Father, Mother.  Other Problems Leighton Ruff, MD; 99991111 11:11 AM) Arthritis Back Pain Enlarged Prostate Gastroesophageal Reflux Disease Hemorrhoids High blood pressure Hypercholesterolemia Prostate Cancer Sleep Apnea     Review of Systems Leighton Ruff MD; 99991111 11:11 AM) General Present- Appetite Loss, Fatigue and Weight Loss. Not Present- Chills, Fever, Night Sweats and Weight Gain. Skin Present- New Lesions and Non-Healing Wounds. Not Present- Change in Wart/Mole, Dryness, Hives, Jaundice, Rash and Ulcer. HEENT Present- Hearing Loss and Ringing in the Ears. Not Present- Earache, Hoarseness, Nose Bleed, Oral Ulcers, Seasonal Allergies, Sinus Pain, Sore Throat, Visual Disturbances, Wears glasses/contact lenses and Yellow Eyes. Respiratory Present- Snoring. Not Present- Bloody sputum, Chronic Cough, Difficulty Breathing and Wheezing. Cardiovascular Not Present- Chest Pain, Difficulty Breathing Lying Down, Leg Cramps, Palpitations, Rapid Heart Rate, Shortness of Breath and Swelling of Extremities. Gastrointestinal Present- Abdominal Pain, Bloating, Bloody Stool and Chronic diarrhea. Not Present- Change in Bowel Habits, Constipation, Difficulty Swallowing, Excessive gas, Gets full quickly at meals, Hemorrhoids, Indigestion, Nausea, Rectal Pain and Vomiting. Male Genitourinary Present- Urgency. Not Present- Blood in Urine, Change in Urinary Stream, Frequency, Impotence, Nocturia, Painful Urination and Urine Leakage. Musculoskeletal Present- Back Pain and Joint Pain. Not Present- Joint Stiffness, Muscle Pain, Muscle Weakness and Swelling of Extremities. Neurological Not Present- Decreased Memory, Fainting, Headaches, Numbness, Seizures, Tingling, Tremor,  Trouble walking and Weakness. Psychiatric Not Present- Anxiety, Bipolar, Change in Sleep Pattern, Depression, Fearful and Frequent crying. Endocrine Not Present- Cold Intolerance, Excessive Hunger, Hair Changes, Heat Intolerance and New Diabetes. Hematology Present- Blood Thinners. Not Present- Easy Bruising, Excessive bleeding, Gland problems, HIV and Persistent Infections.  Vitals Claiborne Billings Dockery LPN; QA348G 075-GRM AM) 02/13/2019 10:51 AM Weight: 327.8 lb Height: 73in Body Surface Area: 2.65 m Body Mass Index: 43.25 kg/m  Temp.: 98.78F(Thermal Scan)  Pulse: 104 (Regular)  BP: 132/80 (Sitting, Left Arm, Standard)        Physical Exam Leighton Ruff MD; 99991111 11:12 AM)  General Mental Status-Alert. General Appearance-Not in acute distress. Build & Nutrition-Well nourished. Posture-Normal posture. Gait-Normal.  Head and Neck Head-normocephalic, atraumatic with no lesions or palpable masses. Trachea-midline.  Chest and Lung Exam Chest and lung exam reveals -normal excursion with symmetric chest walls.  Cardiovascular Cardiovascular examination reveals -normal heart sounds, regular rate and rhythm with no murmurs.  Abdomen Inspection Inspection of the abdomen reveals - No Hernias. Palpation/Percussion Palpation and Percussion of the abdomen reveal - Soft, Non Tender, No Rigidity (guarding), No hepatosplenomegaly and No Palpable abdominal masses.  Rectal Note: 2 setons at anterior midline, one seton right anterior and one seton anterior midline. no inflammatory changes or purulent drainage. Multiple inflammatory skin tags noted. No fluctuance. 1 seton posterior also without inflammation  Neurologic Neurologic evaluation reveals -alert and oriented x 3 with no impairment of recent or remote memory, normal attention span and ability to concentrate, normal sensation and normal coordination.    Assessment & Plan Leighton Ruff MD;  99991111 11:07 AM)  ANAL FISTULA (K60.3) Impression: 69 year old male with multiple fistulas, status post seton placement. He underwent an MRI earlier this month which showed fistulas with good fistula tracts forming around the setons. There are 2 anterior and one posterior. I have recommended a 2 staged approach performing a ligation of internal fistula tract to the anterior fistulas first. Once this is healed, we would then proceed with a posterior LIFT procedure. We discussed recurrence rate for this procedure is approximately 20%. The risk of incontinence after this procedure is much less than the fistulotomy and given that he has 2 sites of disease, I do not recommend performing multiple fistulotomies. We discussed that there is some risk of incontinence after this procedure as well, but it is much less of a risk. We discussed the postoperative pain will most likely be an issue for him. We discussed needing to taper off of his pain medications. Quickly after surgery. We discussed the need for sitz bath after surgery. I believe he understands all of this. We will proceed with anterior LIFT procedure for now.

## 2019-03-16 DIAGNOSIS — Z6841 Body Mass Index (BMI) 40.0 and over, adult: Secondary | ICD-10-CM | POA: Diagnosis not present

## 2019-03-16 DIAGNOSIS — Z299 Encounter for prophylactic measures, unspecified: Secondary | ICD-10-CM | POA: Diagnosis not present

## 2019-03-16 DIAGNOSIS — I2510 Atherosclerotic heart disease of native coronary artery without angina pectoris: Secondary | ICD-10-CM | POA: Diagnosis not present

## 2019-03-16 DIAGNOSIS — I10 Essential (primary) hypertension: Secondary | ICD-10-CM | POA: Diagnosis not present

## 2019-03-16 DIAGNOSIS — Z789 Other specified health status: Secondary | ICD-10-CM | POA: Diagnosis not present

## 2019-03-16 DIAGNOSIS — K605 Anorectal fistula: Secondary | ICD-10-CM | POA: Diagnosis not present

## 2019-03-21 ENCOUNTER — Encounter (HOSPITAL_BASED_OUTPATIENT_CLINIC_OR_DEPARTMENT_OTHER): Payer: Self-pay | Admitting: General Surgery

## 2019-03-23 ENCOUNTER — Encounter (HOSPITAL_BASED_OUTPATIENT_CLINIC_OR_DEPARTMENT_OTHER): Payer: Self-pay | Admitting: General Surgery

## 2019-03-23 ENCOUNTER — Other Ambulatory Visit: Payer: Self-pay

## 2019-03-23 NOTE — Progress Notes (Signed)
Spoke w/ via phone for pre-op interview---Vandy and spouse [employer]----     I stat 8, hemaglobin a1c        COVID test ------03-26-2019  Arrive at -------800 am 03-29-2019 NPO after ------midnight Medications to take morning of surgery -----amlodipine, metoprolol succinate, pantaprazole Diabetic medication -----n/a Patient Special Instructions ----- Pre-Op special Istructions ----- Patient verbalized understanding of instructions that were given at this phone interview. Patient denies shortness of breath, chest pain, fever, cough a this phone interview  .Anesthesia:cad  PCP:dr a shah Cardiologist :j branch clearance 11-03-2018 epic Chest x-ray :none EKG :11-06-2018 Echo :07-14-2016 epic Cardiac Cath : 09-01-2017 epic Sleep Study/ CPAP :not used due to could not get one, last sleep study 4 or 5 yrs ago Fasting Blood Sugar :      / Checks Blood Sugar -- times a day:  n/a Blood Thinner/ Instructions /Last Dose:n/a ASA / Instructions/ Last Dose : staying on 81 mg aspirin per dr Marcello Moores  Patient denies shortness of breath, chest pain, fever, and cough at this phone interview.

## 2019-03-26 ENCOUNTER — Other Ambulatory Visit (HOSPITAL_COMMUNITY): Payer: Medicare HMO

## 2019-03-26 ENCOUNTER — Other Ambulatory Visit: Payer: Self-pay

## 2019-03-26 ENCOUNTER — Other Ambulatory Visit (HOSPITAL_COMMUNITY)
Admission: RE | Admit: 2019-03-26 | Discharge: 2019-03-26 | Disposition: A | Discharge status: Home / Self Care | Payer: Medicare HMO | Source: Ambulatory Visit | Attending: General Surgery | Admitting: General Surgery

## 2019-03-26 DIAGNOSIS — Z20822 Contact with and (suspected) exposure to covid-19: Secondary | ICD-10-CM | POA: Diagnosis not present

## 2019-03-26 DIAGNOSIS — Z01812 Encounter for preprocedural laboratory examination: Secondary | ICD-10-CM | POA: Insufficient documentation

## 2019-03-27 LAB — SARS CORONAVIRUS 2 (TAT 6-24 HRS): SARS Coronavirus 2: NEGATIVE

## 2019-03-27 NOTE — Progress Notes (Signed)
ADDENDUM to progress note by Zelphia Cairo RN dated 03-23-2019.  Chart reviewed by anesthesia, Konrad Felix PA,  Ok to proceed.

## 2019-03-29 ENCOUNTER — Other Ambulatory Visit: Payer: Self-pay

## 2019-03-29 ENCOUNTER — Ambulatory Visit (HOSPITAL_BASED_OUTPATIENT_CLINIC_OR_DEPARTMENT_OTHER): Payer: Medicare HMO | Admitting: Physician Assistant

## 2019-03-29 ENCOUNTER — Encounter (HOSPITAL_BASED_OUTPATIENT_CLINIC_OR_DEPARTMENT_OTHER): Payer: Self-pay | Admitting: General Surgery

## 2019-03-29 ENCOUNTER — Ambulatory Visit (HOSPITAL_BASED_OUTPATIENT_CLINIC_OR_DEPARTMENT_OTHER)
Admission: RE | Admit: 2019-03-29 | Discharge: 2019-03-29 | Disposition: A | Discharge status: Home / Self Care | Payer: Medicare HMO | Attending: General Surgery | Admitting: General Surgery

## 2019-03-29 ENCOUNTER — Encounter (HOSPITAL_BASED_OUTPATIENT_CLINIC_OR_DEPARTMENT_OTHER)
Admission: RE | Disposition: A | Discharge status: Home / Self Care | Payer: Self-pay | Source: Home / Self Care | Attending: General Surgery

## 2019-03-29 DIAGNOSIS — K219 Gastro-esophageal reflux disease without esophagitis: Secondary | ICD-10-CM | POA: Diagnosis not present

## 2019-03-29 DIAGNOSIS — G4730 Sleep apnea, unspecified: Secondary | ICD-10-CM | POA: Insufficient documentation

## 2019-03-29 DIAGNOSIS — G4733 Obstructive sleep apnea (adult) (pediatric): Secondary | ICD-10-CM | POA: Diagnosis not present

## 2019-03-29 DIAGNOSIS — Z8546 Personal history of malignant neoplasm of prostate: Secondary | ICD-10-CM | POA: Insufficient documentation

## 2019-03-29 DIAGNOSIS — Z7982 Long term (current) use of aspirin: Secondary | ICD-10-CM | POA: Insufficient documentation

## 2019-03-29 DIAGNOSIS — Z6841 Body Mass Index (BMI) 40.0 and over, adult: Secondary | ICD-10-CM | POA: Insufficient documentation

## 2019-03-29 DIAGNOSIS — K603 Anal fistula: Secondary | ICD-10-CM | POA: Insufficient documentation

## 2019-03-29 DIAGNOSIS — I10 Essential (primary) hypertension: Secondary | ICD-10-CM | POA: Insufficient documentation

## 2019-03-29 DIAGNOSIS — Z79899 Other long term (current) drug therapy: Secondary | ICD-10-CM | POA: Diagnosis not present

## 2019-03-29 DIAGNOSIS — I2510 Atherosclerotic heart disease of native coronary artery without angina pectoris: Secondary | ICD-10-CM | POA: Insufficient documentation

## 2019-03-29 DIAGNOSIS — Z79891 Long term (current) use of opiate analgesic: Secondary | ICD-10-CM | POA: Diagnosis not present

## 2019-03-29 HISTORY — DX: Anal fistula: K60.3

## 2019-03-29 HISTORY — DX: Anal fistula, unspecified: K60.30

## 2019-03-29 HISTORY — PX: LIGATION OF INTERNAL FISTULA TRACT: SHX6551

## 2019-03-29 HISTORY — DX: Malignant neoplasm of prostate: C61

## 2019-03-29 HISTORY — DX: Unspecified osteoarthritis, unspecified site: M19.90

## 2019-03-29 HISTORY — DX: Obstructive sleep apnea (adult) (pediatric): G47.33

## 2019-03-29 LAB — POCT I-STAT, CHEM 8
BUN: 18 mg/dL (ref 8–23)
Calcium, Ion: 1.26 mmol/L (ref 1.15–1.40)
Chloride: 101 mmol/L (ref 98–111)
Creatinine, Ser: 1.3 mg/dL — ABNORMAL HIGH (ref 0.61–1.24)
Glucose, Bld: 112 mg/dL — ABNORMAL HIGH (ref 70–99)
HCT: 44 % (ref 39.0–52.0)
Hemoglobin: 15 g/dL (ref 13.0–17.0)
Potassium: 3.3 mmol/L — ABNORMAL LOW (ref 3.5–5.1)
Sodium: 140 mmol/L (ref 135–145)
TCO2: 27 mmol/L (ref 22–32)

## 2019-03-29 LAB — HEMOGLOBIN A1C
Hgb A1c MFr Bld: 5.8 % — ABNORMAL HIGH (ref 4.8–5.6)
Mean Plasma Glucose: 119.76 mg/dL

## 2019-03-29 SURGERY — LIGATION, INTERNAL FISTULA TRACT
Anesthesia: General | Site: Anus

## 2019-03-29 MED ORDER — OXYCODONE HCL 5 MG PO TABS: 5.0000 mg | ORAL_TABLET | Freq: Four times a day (QID) | ORAL | 0 refills | Status: DC | PRN

## 2019-03-29 MED ORDER — MEPERIDINE HCL 25 MG/ML IJ SOLN
6.2500 mg | INTRAMUSCULAR | Status: DC | PRN
  Filled 2019-03-29: qty 1

## 2019-03-29 MED ORDER — MIDAZOLAM HCL 2 MG/2ML IJ SOLN
INTRAMUSCULAR | Status: AC
  Filled 2019-03-29: qty 2

## 2019-03-29 MED ORDER — FENTANYL CITRATE (PF) 100 MCG/2ML IJ SOLN
INTRAMUSCULAR | Status: DC | PRN
  Administered 2019-03-29 (×2): 50 ug via INTRAVENOUS

## 2019-03-29 MED ORDER — MIDAZOLAM HCL 2 MG/2ML IJ SOLN
INTRAMUSCULAR | Status: DC | PRN
  Administered 2019-03-29: 2 mg via INTRAVENOUS

## 2019-03-29 MED ORDER — BUPIVACAINE-EPINEPHRINE 0.5% -1:200000 IJ SOLN
INTRAMUSCULAR | Status: DC | PRN
  Administered 2019-03-29: 50 mL

## 2019-03-29 MED ORDER — SUGAMMADEX SODIUM 500 MG/5ML IV SOLN
INTRAVENOUS | Status: AC
  Filled 2019-03-29: qty 5

## 2019-03-29 MED ORDER — SUCCINYLCHOLINE CHLORIDE 200 MG/10ML IV SOSY
PREFILLED_SYRINGE | INTRAVENOUS | Status: AC
  Filled 2019-03-29: qty 10

## 2019-03-29 MED ORDER — PHENYLEPHRINE 40 MCG/ML (10ML) SYRINGE FOR IV PUSH (FOR BLOOD PRESSURE SUPPORT)
PREFILLED_SYRINGE | INTRAVENOUS | Status: DC | PRN
  Administered 2019-03-29 (×3): 80 ug via INTRAVENOUS

## 2019-03-29 MED ORDER — CHLORHEXIDINE GLUCONATE CLOTH 2 % EX PADS
6.0000 | MEDICATED_PAD | Freq: Once | CUTANEOUS | Status: DC
  Filled 2019-03-29: qty 6

## 2019-03-29 MED ORDER — LIDOCAINE 2% (20 MG/ML) 5 ML SYRINGE
INTRAMUSCULAR | Status: DC | PRN
  Administered 2019-03-29: 50 mg via INTRAVENOUS

## 2019-03-29 MED ORDER — LACTATED RINGERS IV SOLN
INTRAVENOUS | Status: DC
  Administered 2019-03-29: 12:00:00 1000 mL via INTRAVENOUS
  Filled 2019-03-29: qty 1000

## 2019-03-29 MED ORDER — ONDANSETRON HCL 4 MG/2ML IJ SOLN
INTRAMUSCULAR | Status: DC | PRN
  Administered 2019-03-29: 4 mg via INTRAVENOUS

## 2019-03-29 MED ORDER — SUCCINYLCHOLINE CHLORIDE 200 MG/10ML IV SOSY
PREFILLED_SYRINGE | INTRAVENOUS | Status: DC | PRN
  Administered 2019-03-29: 140 mg via INTRAVENOUS

## 2019-03-29 MED ORDER — ACETAMINOPHEN 500 MG PO TABS
ORAL_TABLET | ORAL | Status: AC
  Filled 2019-03-29: qty 2

## 2019-03-29 MED ORDER — PHENYLEPHRINE HCL-NACL 20-0.9 MG/250ML-% IV SOLN
INTRAVENOUS | Status: DC | PRN
  Administered 2019-03-29: 20 ug/min via INTRAVENOUS

## 2019-03-29 MED ORDER — PHENYLEPHRINE HCL (PRESSORS) 10 MG/ML IV SOLN
INTRAVENOUS | Status: AC
  Filled 2019-03-29: qty 2

## 2019-03-29 MED ORDER — PROPOFOL 10 MG/ML IV BOLUS
INTRAVENOUS | Status: DC | PRN
  Administered 2019-03-29: 200 mg via INTRAVENOUS

## 2019-03-29 MED ORDER — GABAPENTIN 300 MG PO CAPS
ORAL_CAPSULE | ORAL | Status: AC
  Filled 2019-03-29: qty 1

## 2019-03-29 MED ORDER — ACETAMINOPHEN 500 MG PO TABS
1000.0000 mg | ORAL_TABLET | ORAL | Status: AC
  Administered 2019-03-29: 08:00:00 1000 mg via ORAL
  Filled 2019-03-29: qty 2

## 2019-03-29 MED ORDER — ROCURONIUM BROMIDE 10 MG/ML (PF) SYRINGE
PREFILLED_SYRINGE | INTRAVENOUS | Status: DC | PRN
  Administered 2019-03-29: 10 mg via INTRAVENOUS
  Administered 2019-03-29: 40 mg via INTRAVENOUS

## 2019-03-29 MED ORDER — FENTANYL CITRATE (PF) 100 MCG/2ML IJ SOLN
INTRAMUSCULAR | Status: AC
  Filled 2019-03-29: qty 2

## 2019-03-29 MED ORDER — SUGAMMADEX SODIUM 200 MG/2ML IV SOLN
INTRAVENOUS | Status: DC | PRN
  Administered 2019-03-29: 300 mg via INTRAVENOUS

## 2019-03-29 MED ORDER — LIDOCAINE 2% (20 MG/ML) 5 ML SYRINGE
INTRAMUSCULAR | Status: AC
  Filled 2019-03-29: qty 5

## 2019-03-29 MED ORDER — BUPIVACAINE LIPOSOME 1.3 % IJ SUSP
INTRAMUSCULAR | Status: DC | PRN
  Administered 2019-03-29: 20 mL

## 2019-03-29 MED ORDER — ROCURONIUM BROMIDE 10 MG/ML (PF) SYRINGE
PREFILLED_SYRINGE | INTRAVENOUS | Status: AC
  Filled 2019-03-29: qty 10

## 2019-03-29 MED ORDER — SODIUM CHLORIDE 0.9% FLUSH
3.0000 mL | Freq: Two times a day (BID) | INTRAVENOUS | Status: DC
  Filled 2019-03-29: qty 3

## 2019-03-29 MED ORDER — BUPIVACAINE LIPOSOME 1.3 % IJ SUSP
20.0000 mL | Freq: Once | INTRAMUSCULAR | Status: DC
  Filled 2019-03-29: qty 20

## 2019-03-29 MED ORDER — PROMETHAZINE HCL 25 MG/ML IJ SOLN
6.2500 mg | INTRAMUSCULAR | Status: DC | PRN
  Filled 2019-03-29: qty 1

## 2019-03-29 MED ORDER — MIDAZOLAM HCL 2 MG/2ML IJ SOLN
0.5000 mg | Freq: Once | INTRAMUSCULAR | Status: DC | PRN
  Filled 2019-03-29: qty 2

## 2019-03-29 MED ORDER — ONDANSETRON HCL 4 MG/2ML IJ SOLN
INTRAMUSCULAR | Status: AC
  Filled 2019-03-29: qty 2

## 2019-03-29 MED ORDER — GABAPENTIN 300 MG PO CAPS
300.0000 mg | ORAL_CAPSULE | ORAL | Status: AC
  Administered 2019-03-29: 08:00:00 300 mg via ORAL
  Filled 2019-03-29: qty 1

## 2019-03-29 MED ORDER — FENTANYL CITRATE (PF) 100 MCG/2ML IJ SOLN
25.0000 ug | INTRAMUSCULAR | Status: DC | PRN
  Filled 2019-03-29: qty 1

## 2019-03-29 MED ORDER — HYDROGEN PEROXIDE 3 % EX SOLN
CUTANEOUS | Status: DC | PRN
  Administered 2019-03-29: 1

## 2019-03-29 MED ORDER — PROPOFOL 10 MG/ML IV BOLUS
INTRAVENOUS | Status: AC
  Filled 2019-03-29: qty 20

## 2019-03-29 MED ORDER — PHENYLEPHRINE 40 MCG/ML (10ML) SYRINGE FOR IV PUSH (FOR BLOOD PRESSURE SUPPORT)
PREFILLED_SYRINGE | INTRAVENOUS | Status: AC
  Filled 2019-03-29: qty 10

## 2019-03-29 SURGICAL SUPPLY — 56 items
BENZOIN TINCTURE PRP APPL 2/3 (GAUZE/BANDAGES/DRESSINGS) ×6 IMPLANT
BLADE EXTENDED COATED 6.5IN (ELECTRODE) IMPLANT
BLADE HEX COATED 2.75 (ELECTRODE) ×3 IMPLANT
BLADE SURG 10 STRL SS (BLADE) ×3 IMPLANT
BRIEF STRETCH FOR OB PAD LRG (UNDERPADS AND DIAPERS) ×3 IMPLANT
CANISTER SUCT 3000ML PPV (MISCELLANEOUS) ×3 IMPLANT
COVER BACK TABLE 60X90IN (DRAPES) ×3 IMPLANT
COVER MAYO STAND STRL (DRAPES) ×3 IMPLANT
COVER WAND RF STERILE (DRAPES) ×3 IMPLANT
DRAPE LAPAROTOMY 100X72 PEDS (DRAPES) ×3 IMPLANT
DRAPE UTILITY XL STRL (DRAPES) ×3 IMPLANT
DRSG PAD ABDOMINAL 8X10 ST (GAUZE/BANDAGES/DRESSINGS) ×3 IMPLANT
ELECT REM PT RETURN 9FT ADLT (ELECTROSURGICAL) ×3
ELECTRODE REM PT RTRN 9FT ADLT (ELECTROSURGICAL) ×1 IMPLANT
GAUZE SPONGE 4X4 12PLY STRL (GAUZE/BANDAGES/DRESSINGS) ×3 IMPLANT
GAUZE SPONGE 4X4 12PLY STRL LF (GAUZE/BANDAGES/DRESSINGS) ×3 IMPLANT
GLOVE BIO SURGEON STRL SZ 6.5 (GLOVE) ×2 IMPLANT
GLOVE BIO SURGEONS STRL SZ 6.5 (GLOVE) ×1
GLOVE BIOGEL PI IND STRL 7.0 (GLOVE) ×1 IMPLANT
GLOVE BIOGEL PI INDICATOR 7.0 (GLOVE) ×2
HYDROGEN PEROXIDE 16OZ (MISCELLANEOUS) ×3 IMPLANT
IV CATH 14GX2 1/4 (CATHETERS) ×3 IMPLANT
IV CATH 18G SAFETY (IV SOLUTION) ×3 IMPLANT
KIT SIGMOIDOSCOPE (SET/KITS/TRAYS/PACK) IMPLANT
KIT TURNOVER CYSTO (KITS) ×3 IMPLANT
NEEDLE HYPO 22GX1.5 SAFETY (NEEDLE) ×3 IMPLANT
NS IRRIG 500ML POUR BTL (IV SOLUTION) ×3 IMPLANT
PACK BASIN DAY SURGERY FS (CUSTOM PROCEDURE TRAY) ×3 IMPLANT
PAD ARMBOARD 7.5X6 YLW CONV (MISCELLANEOUS) IMPLANT
PENCIL BUTTON HOLSTER BLD 10FT (ELECTRODE) ×3 IMPLANT
RETRACTOR STAY HOOK 5MM (MISCELLANEOUS) ×3 IMPLANT
RETRACTOR STERILE 25.8CMX11.3 (INSTRUMENTS) ×3 IMPLANT
SPONGE SURGIFOAM ABS GEL 12-7 (HEMOSTASIS) IMPLANT
SUCTION FRAZIER HANDLE 10FR (MISCELLANEOUS) ×2
SUCTION TUBE FRAZIER 10FR DISP (MISCELLANEOUS) ×1 IMPLANT
SUT CHROMIC 2 0 SH (SUTURE) ×3 IMPLANT
SUT CHROMIC 3 0 SH 27 (SUTURE) IMPLANT
SUT SILK 2 0 (SUTURE) ×2
SUT SILK 2 0 SH (SUTURE) ×3 IMPLANT
SUT SILK 2 0 SH CR/8 (SUTURE) ×3 IMPLANT
SUT SILK 2-0 18XBRD TIE 12 (SUTURE) ×1 IMPLANT
SUT SILK 3 0 SH 30 (SUTURE) IMPLANT
SUT VIC AB 2-0 SH 27 (SUTURE)
SUT VIC AB 2-0 SH 27XBRD (SUTURE) IMPLANT
SUT VIC AB 3-0 SH 27 (SUTURE)
SUT VIC AB 3-0 SH 27X BRD (SUTURE) IMPLANT
SUT VIC AB 3-0 SH 8-18 (SUTURE) ×6 IMPLANT
SUT VICRYL 3 0 UR 6 27 (SUTURE) IMPLANT
SYR 10ML LL (SYRINGE) ×3 IMPLANT
SYR BULB IRRIGATION 50ML (SYRINGE) ×3 IMPLANT
SYR CONTROL 10ML LL (SYRINGE) ×3 IMPLANT
TOWEL OR 17X26 10 PK STRL BLUE (TOWEL DISPOSABLE) ×3 IMPLANT
TRAY DSU PREP LF (CUSTOM PROCEDURE TRAY) ×3 IMPLANT
TUBE CONNECTING 12'X1/4 (SUCTIONS) ×1
TUBE CONNECTING 12X1/4 (SUCTIONS) ×2 IMPLANT
YANKAUER SUCT BULB TIP NO VENT (SUCTIONS) ×3 IMPLANT

## 2019-03-29 NOTE — Transfer of Care (Signed)
Immediate Anesthesia Transfer of Care Note  Patient: Phillip Rios  Procedure(s) Performed: Procedure(s) (LRB): LIGATION OF ANTERIOR FISTULA TRACT (N/A)  Patient Location: PACU  Anesthesia Type: General  Level of Consciousness: awake, oriented, sedated and patient cooperative  Airway & Oxygen Therapy: Patient Spontanous Breathing and Patient connected to face mask oxygen  Post-op Assessment: Report given to PACU RN and Post -op Vital signs reviewed and stable  Post vital signs: Reviewed and stable  Complications: No apparent anesthesia complications  Last Vitals:  Vitals Value Taken Time  BP    Temp    Pulse 81 03/29/19 1100  Resp 19 03/29/19 1100  SpO2 100 % 03/29/19 1100  Vitals shown include unvalidated device data.  Last Pain:  Vitals:   03/29/19 0815  TempSrc: Oral  PainSc: 4       Patients Stated Pain Goal: 5 (03/29/19 0815)

## 2019-03-29 NOTE — Op Note (Signed)
03/29/2019  10:40 AM  PATIENT:  Phillip Rios  69 y.o. male  Patient Care Team: Monico Blitz, MD as PCP - General (Internal Medicine) Harl Bowie Alphonse Guild, MD as PCP - Cardiology (Cardiology)  PRE-OPERATIVE DIAGNOSIS:  ANAL FISTULA  POST-OPERATIVE DIAGNOSIS:  ANAL FISTULA  PROCEDURE:   LIGATION OF INTERNAL FISTULA TRACT (ANTERIOR) ANAL EXAM UNDER ANESTHESIA   Surgeon(s): Leighton Ruff, MD  ASSISTANT: none   ANESTHESIA:   local and general  SPECIMEN:  No Specimen  DISPOSITION OF SPECIMEN:  N/A  COUNTS:  YES  PLAN OF CARE: Discharge to home after PACU  PATIENT DISPOSITION:  PACU - hemodynamically stable.  INDICATION: 69 y.o. M with multiple fistula and 4 setons in place.   OR FINDINGS: 2 internal openings and anterior and posterior midline with 4 external opening.  Anterior midline intersphincteric fistula and right posterior trans-sphincteric fistula.  Trans-sphincteric posterior midline setons exit right next to each other with common internal opening involving ~10% internal sphincter involvement.    DESCRIPTION: the patient was identified in the preoperative holding area and taken to the OR where they were laid on the operating room table.  General anesthesia was induced without difficulty. The patient was then positioned in prone jackknife position with buttocks gently taped apart.  The patient was then prepped and draped in usual sterile fashion.  SCDs were noted to be in place prior to the initiation of anesthesia. A surgical timeout was performed indicating the correct patient, procedure, positioning and need for preoperative antibiotics.  A rectal block was performed using Marcaine with epinephrine mixed with Experel.    I began with a digital rectal exam.  There was obvious sphincter hypertension.  This was gently dilated.  I then placed a Hill-Ferguson anoscope into the anal canal and evaluated this completely.   There were 2 internal openings and anterior and  posterior midline with 4 external openings.  There was an anterior midline intersphincteric fistula and right posterior trans-sphincteric fistula.  There was also trans-sphincteric posterior midline setons that exit right next to each other with common internal opening involving ~10% internal sphincter involvement.  Given the common internal opening, I removed the lateral one.    We had already discussed that we would only repair one side at a time.  I decided to repair the anterior fistula first, as it was the deepest.  I made an incision through the external opening of the intersphincteric tract and extended this laterally over the intersphincteric groove.  I then dissected down on either side of the fistula tract.  A fistula probe was inserted to allow for identification of the tract as I went.  I dissected posteriorly underneath the tract using a right angle clamp.  Once the tract was then down I was able to place a 2-0 silk suture around the fistula tract.  I placed another silk suture through this for the internal portion of the tract.  I then tied both of these sutures down and transected the fistula in the middle.  An additional 2 oh figure-of-eight suture was placed over the opening of both fistula tracts to allow for complete closure.  I tested this by placing the fistula probe into the fistula tract.  It was not patent to the fistula tract.  I then injected hydrogen peroxide into the fistula tract and there was no leak of hydrogen peroxide into the wound.  This was consistent with a watertight repair.  Fistula tract was irrigated with normal saline.  The remaining  internal intersphincteric fistula tract was also closed with a 2-0 silk suture.  I then reapproximated the intersphincteric space using interrupted 3-0 Vicryl sutures.  The skin was closed using interrupted 3-0 chromic sutures.  Once this was complete, the retractor was removed and a dressing was applied.  The patient was then awakened from  anesthesia and sent to the postanesthesia care unit in stable condition.  All counts were correct per operating room staff.

## 2019-03-29 NOTE — H&P (Signed)
The patient is a 69 year old male who presents with anal fistula. 69 year old male who presents to the office for evaluation of multiple fistulas seen on most recent exam under anesthesia.  He states that last summer he developed an abscess which was drained at [facility].  Since then, he has had trouble with pain and recurrent abscesses.  He was taken to the operating room with Dr. Constance Haw in [place] and was noted to have multiple fistula tracts involving the sphincter complex.  Setons were placed.  He was scheduled for a colonoscopy to rule out Crohn's disease.  A biopsy was also obtained.  This showed dense inflammatory tissue with granulation consistent with a fistula tract.  Patient does have a history of prostate radiation.  He states that he has multiple loose stools throughout the day, but this is not a new issue for him.  Colonoscopy did not show any signs of Crohn's disease. He has had continued pain after surgery.  Diltiazem ointment has helped the most with that.       Problem List/Past Medical Leighton Ruff, MD; 99991111 11:11 AM) ANAL FISTULA (K60.3)    Past Surgical History Leighton Ruff, MD; 99991111 11:11 AM) Orpah Cobb Fissure Repair    Diagnostic Studies History Leighton Ruff, MD; 99991111 11:11 AM) Colonoscopy  1-5 years ago   Allergies Mammie Lorenzo, LPN; [id number] 075-GRM AM) No Known Drug Allergies  [12/05/2018]:   Medication History Mammie Lorenzo, LPN; [id number] 075-GRM AM) Pantoprazole Sodium  (40MG  Tablet DR, Oral) Active. oxyCODONE HCl  (5MG  Tablet, Oral) Active. Tylenol  (500MG  Capsule, Oral) Active. Norvasc  (5MG  Tablet, Oral) Active. Aspirin  (81MG  Tablet, Oral) Active. Colace  (100MG  Capsule, Oral) Active. hydroCHLOROthiazide  (25MG  Tablet, Oral) Active. Medications Reconciled    Social History Leighton Ruff, MD; 99991111 11:11 AM) Alcohol use  Remotely quit alcohol use. Caffeine use  Tea. Tobacco use  Never smoker.   Family History Leighton Ruff, MD; 99991111 11:11 AM) Arthritis  Brother, Father, Mother, Sister. Diabetes Mellitus  Mother. Heart Disease  Brother, Father, Mother. Hypertension  Father, Mother.   Other Problems Leighton Ruff, MD; 99991111 11:11 AM) Arthritis  Back Pain  Enlarged Prostate  Gastroesophageal Reflux Disease  Hemorrhoids  High blood pressure  Hypercholesterolemia  Prostate Cancer  Sleep Apnea          Review of Systems  General Present- Appetite Loss, Fatigue and Weight Loss. Not Present- Chills, Fever, Night Sweats and Weight Gain. Skin Present- New Lesions and Non-Healing Wounds. Not Present- Change in Wart/Mole, Dryness, Hives, Jaundice, Rash and Ulcer. HEENT Present- Hearing Loss and Ringing in the Ears. Not Present- Earache, Hoarseness, Nose Bleed, Oral Ulcers, Seasonal Allergies, Sinus Pain, Sore Throat, Visual Disturbances, Wears glasses/contact lenses and Yellow Eyes. Respiratory Present- Snoring. Not Present- Bloody sputum, Chronic Cough, Difficulty Breathing and Wheezing. Cardiovascular Not Present- Chest Pain, Difficulty Breathing Lying Down, Leg Cramps, Palpitations, Rapid Heart Rate, Shortness of Breath and Swelling of Extremities. Gastrointestinal Present- Abdominal Pain, Bloating, Bloody Stool and Chronic diarrhea. Not Present- Change in Bowel Habits, Constipation, Difficulty Swallowing, Excessive gas, Gets full quickly at meals, Hemorrhoids, Indigestion, Nausea, Rectal Pain and Vomiting. Male Genitourinary Present- Urgency. Not Present- Blood in Urine, Change in Urinary Stream, Frequency, Impotence, Nocturia, Painful Urination and Urine Leakage. Musculoskeletal Present- Back Pain and Joint Pain. Not Present- Joint Stiffness, Muscle Pain, Muscle Weakness and Swelling of Extremities. Neurological Not Present- Decreased Memory, Fainting, Headaches, Numbness, Seizures, Tingling, Tremor, Trouble walking and Weakness. Psychiatric Not Present- Anxiety, Bipolar, Change in  Sleep  Pattern, Depression, Fearful and Frequent crying. Endocrine Not Present- Cold Intolerance, Excessive Hunger, Hair Changes, Heat Intolerance and New Diabetes. Hematology Present- Blood Thinners. Not Present- Easy Bruising, Excessive bleeding, Gland problems, HIV and Persistent Infections.   BP (!) 155/98   Pulse 81   Temp 97.7 F (36.5 C) (Oral)   Resp 20   Ht 6\' 1"  (1.854 m)   Wt (!) 150.1 kg   SpO2 96%   BMI 43.67 kg/m         Physical Exam    General Mental Status - Alert. General Appearance - Not in acute distress. Build & Nutrition - Well nourished. Posture - Normal posture. Gait - Normal.   Head and Neck Head - normocephalic, atraumatic with no lesions or palpable masses. Trachea - midline.   Chest and Lung Exam Chest and lung exam reveals  - normal excursion with symmetric chest walls.   Cardiovascular Cardiovascular examination reveals  - normal heart sounds, regular rate and rhythm with no murmurs.   Abdomen Inspection Inspection of the abdomen reveals - No Hernias. Palpation/Percussion Palpation and Percussion of the abdomen reveal - Soft, Non Tender, No Rigidity (guarding), No hepatosplenomegaly and No Palpable abdominal masses.   Rectal Note:  2 setons at anterior midline, one seton right anterior and one seton anterior midline.  no inflammatory changes or purulent drainage.  Multiple inflammatory skin tags noted.  No fluctuance.  1 seton posterior also without inflammation   Neurologic Neurologic evaluation reveals  - alert and oriented x 3 with no impairment of recent or remote memory, normal attention span and ability to concentrate, normal sensation and normal coordination.         ANAL FISTULA (K60.3) Impression: 69 year old male with multiple fistulas, status post seton placement. He underwent an MRI earlier this month which showed fistulas with good fistula tracts forming around the setons. There are 2 anterior and one posterior. I have  recommended a 2 staged approach performing a ligation of internal fistula tract to the anterior fistulas first. Once this is healed, we would then proceed with a posterior LIFT procedure. We discussed recurrence rate for this procedure is approximately 20%. The risk of incontinence after this procedure is much less than the fistulotomy and given that he has 2 sites of disease, I do not recommend performing multiple fistulotomies. We discussed that there is some risk of incontinence after this procedure as well, but it is much less of a risk. We discussed the postoperative pain will most likely be an issue for him. We discussed needing to taper off of his pain medications quickly after surgery. We discussed the need for sitz bath after surgery. I believe he understands all of this. We will proceed with anterior LIFT procedure for now.

## 2019-03-29 NOTE — Anesthesia Postprocedure Evaluation (Signed)
Anesthesia Post Note  Patient: Phillip Rios  Procedure(s) Performed: LIGATION OF ANTERIOR FISTULA TRACT (N/A Anus)     Patient location during evaluation: PACU Anesthesia Type: General Level of consciousness: awake and alert, patient cooperative and oriented Pain management: pain level controlled Vital Signs Assessment: post-procedure vital signs reviewed and stable Respiratory status: spontaneous breathing, nonlabored ventilation and respiratory function stable Cardiovascular status: blood pressure returned to baseline and stable Postop Assessment: no apparent nausea or vomiting and adequate PO intake Anesthetic complications: no    Last Vitals:  Vitals:   03/29/19 1115 03/29/19 1224  BP: 139/81 139/82  Pulse: 69 75  Resp: 15 16  Temp:  36.7 C  SpO2: 94% 97%    Last Pain:  Vitals:   03/29/19 1224  TempSrc: Oral  PainSc: 0-No pain                 Unnamed Hino,E. Curvin Hunger

## 2019-03-29 NOTE — Anesthesia Preprocedure Evaluation (Addendum)
Anesthesia Evaluation  Patient identified by MRN, date of birth, ID band Patient awake    Reviewed: Allergy & Precautions, NPO status , Patient's Chart, lab work & pertinent test results  History of Anesthesia Complications Negative for: history of anesthetic complications  Airway Mallampati: II  TM Distance: >3 FB Neck ROM: Full    Dental  (+) Poor Dentition, Missing, Loose, Chipped, Dental Advisory Given   Pulmonary sleep apnea (does not use CPAP) ,  03/26/2019 SARS coronavirus NEG   breath sounds clear to auscultation       Cardiovascular hypertension, Pt. on medications and Pt. on home beta blockers (-) angina+ CAD and + Cardiac Stents  + dysrhythmias Atrial Fibrillation and Supra Ventricular Tachycardia  Rhythm:Regular Rate:Normal  '19 cath: Successful PTCA/DES x 1 mid LAD, Normal LV systolic function  '18 ECHO: EF 65-70%, valves OK   Neuro/Psych negative neurological ROS     GI/Hepatic Neg liver ROS, GERD  Medicated and Controlled,  Endo/Other  Morbid obesity  Renal/GU negative Renal ROS   Prostate cancer    Musculoskeletal  (+) Arthritis ,   Abdominal (+) + obese,   Peds  Hematology negative hematology ROS (+)   Anesthesia Other Findings   Reproductive/Obstetrics                            Anesthesia Physical Anesthesia Plan  ASA: III  Anesthesia Plan: General   Post-op Pain Management:    Induction: Intravenous  PONV Risk Score and Plan: 2 and Ondansetron and Dexamethasone  Airway Management Planned: Oral ETT  Additional Equipment:   Intra-op Plan:   Post-operative Plan: Extubation in OR  Informed Consent: I have reviewed the patients History and Physical, chart, labs and discussed the procedure including the risks, benefits and alternatives for the proposed anesthesia with the patient or authorized representative who has indicated his/her understanding and  acceptance.     Dental advisory given  Plan Discussed with: CRNA and Surgeon  Anesthesia Plan Comments:        Anesthesia Quick Evaluation

## 2019-03-29 NOTE — Discharge Instructions (Addendum)
Post Anesthesia Home Care Instructions  Activity: Get plenty of rest for the remainder of the day. A responsible individual must stay with you for 24 hours following the procedure.  For the next 24 hours, DO NOT: -Drive a car -Paediatric nurse -Drink alcoholic beverages -Take any medication unless instructed by your physician -Make any legal decisions or sign important papers.  Meals: Start with liquid foods such as gelatin or soup. Progress to regular foods as tolerated. Avoid greasy, spicy, heavy foods. If nausea and/or vomiting occur, drink only clear liquids until the nausea and/or vomiting subsides. Call your physician if vomiting continues.  Special Instructions/Symptoms: Your throat may feel dry or sore from the anesthesia or the breathing tube placed in your throat during surgery. If this causes discomfort, gargle with warm salt water. The discomfort should disappear within 24 hours.  If you had a scopolamine patch placed behind your ear for the management of post- operative nausea and/or vomiting:  1. The medication in the patch is effective for 72 hours, after which it should be removed.  Wrap patch in a tissue and discard in the trash. Wash hands thoroughly with soap and water. 2. You may remove the patch earlier than 72 hours if you experience unpleasant side effects which may include dry mouth, dizziness or visual disturbances. 3. Avoid touching the patch. Wash your hands with soap and water after contact with the patch.    ANORECTAL SURGERY: POST OP INSTRUCTIONS 1. Take your usually prescribed home medications unless otherwise directed. 2. DIET: During the first few hours after surgery sip on some liquids until you are able to urinate.  It is normal to not urinate for several hours after this surgery.  If you feel uncomfortable, please contact the office for instructions.  After you are able to urinate,you may eat, if you feel like it.  Follow a light bland diet the first 24  hours after arrival home, such as soup, liquids, crackers, etc.  Be sure to include lots of fluids daily (6-8 glasses).  Avoid fast food or heavy meals, as your are more likely to get nauseated.  Eat a low fat diet the next few days after surgery.  Limit caffeine intake to 1-2 servings a day. 3. PAIN CONTROL: a. Pain is best controlled by a usual combination of several different methods TOGETHER: i. Muscle relaxation: Soak in a warm bath (or Sitz bath) three times a day and after bowel movements.  Continue to do this until all pain is resolved. ii. Over the counter pain medication iii. Prescription pain medication b. Most patients will experience some swelling and discomfort in the anus/rectal area and incisions.  Heat such as warm towels, sitz baths, warm baths, etc to help relax tight/sore spots and speed recovery.  Some people prefer to use ice, especially in the first couple days after surgery, as it may decrease the pain and swelling, or alternate between ice & heat.  Experiment to what works for you.  Swelling and bruising can take several weeks to resolve.  Pain can take even longer to completely resolve. c. It is helpful to take an over-the-counter pain medication regularly for the first few weeks.  Choose one of the following that works best for you: i. Naproxen (Aleve, etc)  Two 220mg  tabs twice a day ii. Ibuprofen (Advil, etc) Three 200mg  tabs four times a day (every meal & bedtime) d. A  prescription for pain medication (such as percocet, oxycodone, hydrocodone, etc) should be given to you  upon discharge.  Take your pain medication as prescribed.  i. If you are having problems/concerns with the prescription medicine (does not control pain, nausea, vomiting, rash, itching, etc), please call us [phone] to see if we need to switch you to a different pain medicine that will work better for you and/or control your side effect better. ii. If you need a refill on your pain medication, please  contact your pharmacy.  They will contact our office to request authorization. Prescriptions will not be filled after 5 pm or on week-ends. 4. KEEP YOUR BOWELS REGULAR and AVOID CONSTIPATION a. The goal is one to two soft bowel movements a day.  You should at least have a bowel movement every other day. b. Avoid getting constipated.  Between the surgery and the pain medications, it is common to experience some constipation. This can be very painful after rectal surgery.  Increasing fluid intake and taking a fiber supplement (such as Metamucil, Citrucel, FiberCon, etc) 1-2 times a day regularly will usually help prevent this problem from occurring.  A stool softener like colace is also recommended.  This can be purchased over the counter at your pharmacy.  You can take it up to 3 times a day.  If you do not have a bowel movement after 24 hrs since your surgery, take one does of milk of magnesia.  If you still haven't had a bowel movement 8-12 hours after that dose, take another dose.  If you don't have a bowel movement 48 hrs after surgery, purchase a Fleets enema from the drug store and administer gently per package instructions.  If you still are having trouble with your bowel movements after that, please call the office for further instructions. c. If you develop diarrhea or have many loose bowel movements, simplify your diet to bland foods & liquids for a few days.  Stop any stool softeners and decrease your fiber supplement.  Switching to mild anti-diarrheal medications (Kayopectate, Pepto Bismol) can help.  If this worsens or does not improve, please call us.  5. Wound Care a. Remove your bandages before your first bowel movement or 8 hours after surgery.     b. Remove any wound packing material at this tim,e as well.  You do not need to repack the wound unless instructed otherwise.  Wear an absorbent pad or soft cotton gauze in your underwear to catch any drainage and help keep the area clean. You  should change this every 2-3 hours while awake. c. Keep the area clean and dry.  Bathe / shower every day, especially after bowel movements.  Keep the area clean by showering / bathing over the incision / wound.   It is okay to soak an open wound to help wash it.  Wet wipes or showers / gentle washing after bowel movements is often less traumatic than regular toilet paper. d. Dennis Bast may have some styrofoam-like soft packing in the rectum which will come out with the first bowel movement.  e. You will often notice bleeding with bowel movements.  This should slow down by the end of the first week of surgery f. Expect some drainage.  This should slow down, too, by the end of the first week of surgery.  Wear an absorbent pad or soft cotton gauze in your underwear until the drainage stops. g. Do Not sit on a rubber or pillow ring.  This can make you symptoms worse.  You may sit on a soft pillow if needed.  6. ACTIVITIES as tolerated:   a. You may resume regular (light) daily activities beginning the next day--such as daily self-care, walking, climbing stairs--gradually increasing activities as tolerated.  If you can walk 30 minutes without difficulty, it is safe to try more intense activity such as jogging, treadmill, bicycling, low-impact aerobics, swimming, etc. b. Save the most intensive and strenuous activity for last such as sit-ups, heavy lifting, contact sports, etc  Refrain from any heavy lifting or straining until you are off narcotics for pain control.   c. You may drive when you are no longer taking prescription pain medication, you can comfortably sit for long periods of time, and you can safely maneuver your car and apply brakes. d. Dennis Bast may have sexual intercourse when it is comfortable.  7. FOLLOW UP in our office a. Please call [facility] at [phone] to set up an appointment to see your surgeon in the office for a follow-up appointment approximately 3-4 weeks after your surgery. b. Make sure that  you call for this appointment the day you arrive home to insure a convenient appointment time. 10. IF YOU HAVE DISABILITY OR FAMILY LEAVE FORMS, BRING THEM TO THE OFFICE FOR PROCESSING.  DO NOT GIVE THEM TO YOUR DOCTOR.     WHEN TO CALL us [phone]: 1. Poor pain control 2. Reactions / problems with new medications (rash/itching, nausea, etc)  3. Fever over 101.5 F (38.5 C) 4. Inability to urinate 5. Nausea and/or vomiting 6. Worsening swelling or bruising 7. Continued bleeding from incision. 8. Increased pain, redness, or drainage from the incision  The clinic staff is available to answer your questions during regular business hours (8:30am-5pm).  Please don't hesitate to call and ask to speak to one of our nurses for clinical concerns.   A surgeon from [facility] is always on call at the hospitals   If you have a medical emergency, go to the nearest emergency room or call 911.    [facility], [place], [place], [place], [place]  [address] ? MAIN: [phone] ? TOLL FREE: [phone] ? FAX [phone] [email or web address]

## 2019-03-29 NOTE — Anesthesia Procedure Notes (Signed)
Procedure Name: Intubation Date/Time: 03/29/2019 9:26 AM Performed by: Suan Halter, CRNA Pre-anesthesia Checklist: Patient identified, Emergency Drugs available, Suction available and Patient being monitored Patient Re-evaluated:Patient Re-evaluated prior to induction Oxygen Delivery Method: Circle system utilized Preoxygenation: Pre-oxygenation with 100% oxygen Induction Type: IV induction Ventilation: Mask ventilation without difficulty Laryngoscope Size: Mac and 4 Grade View: Grade I Tube type: Oral Tube size: 7.5 mm Number of attempts: 1 Airway Equipment and Method: Stylet and Oral airway Placement Confirmation: ETT inserted through vocal cords under direct vision,  positive ETCO2 and breath sounds checked- equal and bilateral Secured at: 23 cm Tube secured with: Tape Dental Injury: Teeth and Oropharynx as per pre-operative assessment

## 2019-05-31 DIAGNOSIS — M7551 Bursitis of right shoulder: Secondary | ICD-10-CM | POA: Diagnosis not present

## 2019-05-31 DIAGNOSIS — I10 Essential (primary) hypertension: Secondary | ICD-10-CM | POA: Diagnosis not present

## 2019-05-31 DIAGNOSIS — E7800 Pure hypercholesterolemia, unspecified: Secondary | ICD-10-CM | POA: Diagnosis not present

## 2019-05-31 DIAGNOSIS — Z299 Encounter for prophylactic measures, unspecified: Secondary | ICD-10-CM | POA: Diagnosis not present

## 2019-05-31 DIAGNOSIS — Z6841 Body Mass Index (BMI) 40.0 and over, adult: Secondary | ICD-10-CM | POA: Diagnosis not present

## 2019-06-04 ENCOUNTER — Ambulatory Visit: Payer: Self-pay | Admitting: General Surgery

## 2019-06-04 NOTE — H&P (Signed)
The patient is a 69 year old male who presents with anal fistula.69 year old male who presents to the office for evaluation of multiple fistulas. He states that last summer he developed an abscess which was drained at [facility]. Since then, he has had trouble with pain and recurrent abscesses. He was taken to the operating room with Dr. Constance Haw in [place] and was noted to have multiple fistula tracts involving the sphincter complex. Setons were placed. He underwent a colonoscopy to rule out Crohn's disease. A biopsy was also obtained. This showed dense inflammatory tissue with granulation consistent with a fistula tract. Patient does have a history of prostate radiation. He states that he has multiple loose stools throughout the day, but this is not a new issue for him. Colonoscopy did not show any signs of Crohn's disease.  Patient presents to the office today status post ligation of internal fistula tract for the anterior fistula. He is still having some pain and drainage.    Problem List/Past Medical Leighton Ruff, MD; [id number] 9:20 AM) ANAL FISTULA (K60.3)  Past Surgical History Leighton Ruff, MD; [id number] 9:20 AM) Orpah Cobb Fissure Repair  Diagnostic Studies History Leighton Ruff, MD; [id number] 9:20 AM) Colonoscopy 1-5 years ago  Allergies (Tanisha A. Owens Shark, [place]; 06/04/2019 9:06 AM) No Known Allergies [05/08/2019]: No Known Drug Allergies [12/05/2018]: Allergies Reconciled  Medication History (Tanisha A. Owens Shark, [place]; 06/04/2019 9:06 AM) Pantoprazole Sodium (40MG  Tablet DR, Oral) Active. Aspirin (81MG  Tablet, Oral) Active. hydroCHLOROthiazide (25MG  Tablet, Oral) Active. amLODIPine Besylate (5MG  Tablet, Oral) Active. Lisinopril (20MG  Tablet, Oral) Active. Tamsulosin HCl (0.4MG  Capsule, Oral) Active. Atorvastatin Calcium (80MG  Tablet, Oral) Active. Metoprolol Succinate ER (50MG  Tablet ER 24HR, Oral) Active. Medications Reconciled  Social History  Leighton Ruff, MD; [id number] 9:20 AM) Alcohol use Remotely quit alcohol use. Caffeine use Tea. Tobacco use Never smoker.  Family History Leighton Ruff, MD; [id number] 9:20 AM) Arthritis Brother, Father, Mother, Sister. Diabetes Mellitus Mother. Heart Disease Brother, Father, Mother. Hypertension Father, Mother.  Other Problems Leighton Ruff, MD; [id number] 9:20 AM) Arthritis Back Pain Enlarged Prostate Gastroesophageal Reflux Disease Hemorrhoids High blood pressure Hypercholesterolemia Prostate Cancer Sleep Apnea     Review of Systems Leighton Ruff MD; [id number] 9:20 AM) General Present- Appetite Loss, Fatigue and Weight Loss. Not Present- Chills, Fever, Night Sweats and Weight Gain. Skin Present- New Lesions and Non-Healing Wounds. Not Present- Change in Wart/Mole, Dryness, Hives, Jaundice, Rash and Ulcer. HEENT Present- Hearing Loss and Ringing in the Ears. Not Present- Earache, Hoarseness, Nose Bleed, Oral Ulcers, Seasonal Allergies, Sinus Pain, Sore Throat, Visual Disturbances, Wears glasses/contact lenses and Yellow Eyes. Respiratory Present- Snoring. Not Present- Bloody sputum, Chronic Cough, Difficulty Breathing and Wheezing. Cardiovascular Not Present- Chest Pain, Difficulty Breathing Lying Down, Leg Cramps, Palpitations, Rapid Heart Rate, Shortness of Breath and Swelling of Extremities. Gastrointestinal Present- Abdominal Pain, Bloating, Bloody Stool and Chronic diarrhea. Not Present- Change in Bowel Habits, Constipation, Difficulty Swallowing, Excessive gas, Gets full quickly at meals, Hemorrhoids, Indigestion, Nausea, Rectal Pain and Vomiting. Male Genitourinary Present- Urgency. Not Present- Blood in Urine, Change in Urinary Stream, Frequency, Impotence, Nocturia, Painful Urination and Urine Leakage. Musculoskeletal Present- Back Pain and Joint Pain. Not Present- Joint Stiffness, Muscle Pain, Muscle Weakness and Swelling of Extremities. Neurological Not  Present- Decreased Memory, Fainting, Headaches, Numbness, Seizures, Tingling, Tremor, Trouble walking and Weakness. Psychiatric Not Present- Anxiety, Bipolar, Change in Sleep Pattern, Depression, Fearful and Frequent crying. Endocrine Not Present- Cold Intolerance, Excessive Hunger, Hair Changes, Heat Intolerance and New Diabetes. Hematology Present- Blood Thinners. Not Present-  Easy Bruising, Excessive bleeding, Gland problems, HIV and Persistent Infections.  Vitals (Tanisha A. Brown RMA; 06/04/2019 9:06 AM) 06/04/2019 9:06 AM Weight: 340.2 lb Height: 73in Body Surface Area: 2.7 m Body Mass Index: 44.88 kg/m  Temp.: 97.64F  Pulse: 91 (Regular)  BP: 138/84(Sitting, Left Arm, Standard)        Physical Exam Leighton Ruff MD; [id number] 9:21 AM)  General Mental Status-Alert. General Appearance-Not in acute distress. Build & Nutrition-Well nourished. Posture-Normal posture. Gait-Normal.  Head and Neck Head-normocephalic, atraumatic with no lesions or palpable masses. Trachea-midline.  Chest and Lung Exam Chest and lung exam reveals -normal excursion with symmetric chest walls.  Cardiovascular Cardiovascular examination reveals -normal heart sounds, regular rate and rhythm with no murmurs.  Abdomen Inspection Inspection of the abdomen reveals - No Hernias. Palpation/Percussion Palpation and Percussion of the abdomen reveal - Soft, Non Tender, No Rigidity (guarding), No hepatosplenomegaly and No Palpable abdominal masses.  Rectal Note: Multiple inflammatory skin tags noted. 1 seton posterior also without inflammation, purulence noted at anterior external drainage site  Neurologic Neurologic evaluation reveals -alert and oriented x 3 with no impairment of recent or remote memory, normal attention span and ability to concentrate, normal sensation and normal coordination.    Assessment & Plan Leighton Ruff MD; [id number] 9:15 AM)  ANAL  FISTULA (K60.3) Impression: 69 year old male who presents to the office for evaluation of multiple anal fistula. On exam today, he does appear to have recurrence of anterior fistula site where we performed his lift procedure. We discussed evaluating this in the operating room and performing a left on the posterior fistula tract. We then may be able to perform a fistulotomy on the anterior tract and preserve as much continence as we can. We discussed alternatives which would be repeating the sphincter preserving procedure on the anterior fistula tract. We discussed the typical recurrence rates with repeat procedures. Upon discussion, he was okay with performing a fistulotomy if it was felt to be needed as the best option for healing his fistula tracts. He understands that this may reduce his continence going forward.

## 2019-06-04 NOTE — H&P (View-Only) (Signed)
The patient is a 69 year old male who presents with anal fistula.69 year old male who presents to the office for evaluation of multiple fistulas. He states that last summer he developed an abscess which was drained at [facility]. Since then, he has had trouble with pain and recurrent abscesses. He was taken to the operating room with Dr. Constance Haw in [place] and was noted to have multiple fistula tracts involving the sphincter complex. Setons were placed. He underwent a colonoscopy to rule out Crohn's disease. A biopsy was also obtained. This showed dense inflammatory tissue with granulation consistent with a fistula tract. Patient does have a history of prostate radiation. He states that he has multiple loose stools throughout the day, but this is not a new issue for him. Colonoscopy did not show any signs of Crohn's disease.  Patient presents to the office today status post ligation of internal fistula tract for the anterior fistula. He is still having some pain and drainage.    Problem List/Past Medical Leighton Ruff, MD; [id number] 9:20 AM) ANAL FISTULA (K60.3)  Past Surgical History Leighton Ruff, MD; [id number] 9:20 AM) Orpah Cobb Fissure Repair  Diagnostic Studies History Leighton Ruff, MD; [id number] 9:20 AM) Colonoscopy 1-5 years ago  Allergies (Tanisha A. Owens Shark, [place]; 06/04/2019 9:06 AM) No Known Allergies [05/08/2019]: No Known Drug Allergies [12/05/2018]: Allergies Reconciled  Medication History (Tanisha A. Owens Shark, [place]; 06/04/2019 9:06 AM) Pantoprazole Sodium (40MG  Tablet DR, Oral) Active. Aspirin (81MG  Tablet, Oral) Active. hydroCHLOROthiazide (25MG  Tablet, Oral) Active. amLODIPine Besylate (5MG  Tablet, Oral) Active. Lisinopril (20MG  Tablet, Oral) Active. Tamsulosin HCl (0.4MG  Capsule, Oral) Active. Atorvastatin Calcium (80MG  Tablet, Oral) Active. Metoprolol Succinate ER (50MG  Tablet ER 24HR, Oral) Active. Medications Reconciled  Social History  Leighton Ruff, MD; [id number] 9:20 AM) Alcohol use Remotely quit alcohol use. Caffeine use Tea. Tobacco use Never smoker.  Family History Leighton Ruff, MD; [id number] 9:20 AM) Arthritis Brother, Father, Mother, Sister. Diabetes Mellitus Mother. Heart Disease Brother, Father, Mother. Hypertension Father, Mother.  Other Problems Leighton Ruff, MD; [id number] 9:20 AM) Arthritis Back Pain Enlarged Prostate Gastroesophageal Reflux Disease Hemorrhoids High blood pressure Hypercholesterolemia Prostate Cancer Sleep Apnea     Review of Systems Leighton Ruff MD; [id number] 9:20 AM) General Present- Appetite Loss, Fatigue and Weight Loss. Not Present- Chills, Fever, Night Sweats and Weight Gain. Skin Present- New Lesions and Non-Healing Wounds. Not Present- Change in Wart/Mole, Dryness, Hives, Jaundice, Rash and Ulcer. HEENT Present- Hearing Loss and Ringing in the Ears. Not Present- Earache, Hoarseness, Nose Bleed, Oral Ulcers, Seasonal Allergies, Sinus Pain, Sore Throat, Visual Disturbances, Wears glasses/contact lenses and Yellow Eyes. Respiratory Present- Snoring. Not Present- Bloody sputum, Chronic Cough, Difficulty Breathing and Wheezing. Cardiovascular Not Present- Chest Pain, Difficulty Breathing Lying Down, Leg Cramps, Palpitations, Rapid Heart Rate, Shortness of Breath and Swelling of Extremities. Gastrointestinal Present- Abdominal Pain, Bloating, Bloody Stool and Chronic diarrhea. Not Present- Change in Bowel Habits, Constipation, Difficulty Swallowing, Excessive gas, Gets full quickly at meals, Hemorrhoids, Indigestion, Nausea, Rectal Pain and Vomiting. Male Genitourinary Present- Urgency. Not Present- Blood in Urine, Change in Urinary Stream, Frequency, Impotence, Nocturia, Painful Urination and Urine Leakage. Musculoskeletal Present- Back Pain and Joint Pain. Not Present- Joint Stiffness, Muscle Pain, Muscle Weakness and Swelling of Extremities. Neurological Not  Present- Decreased Memory, Fainting, Headaches, Numbness, Seizures, Tingling, Tremor, Trouble walking and Weakness. Psychiatric Not Present- Anxiety, Bipolar, Change in Sleep Pattern, Depression, Fearful and Frequent crying. Endocrine Not Present- Cold Intolerance, Excessive Hunger, Hair Changes, Heat Intolerance and New Diabetes. Hematology Present- Blood Thinners. Not Present-  Easy Bruising, Excessive bleeding, Gland problems, HIV and Persistent Infections.  Vitals (Tanisha A. Brown RMA; 06/04/2019 9:06 AM) 06/04/2019 9:06 AM Weight: 340.2 lb Height: 73in Body Surface Area: 2.7 m Body Mass Index: 44.88 kg/m  Temp.: 97.42F  Pulse: 91 (Regular)  BP: 138/84(Sitting, Left Arm, Standard)        Physical Exam Leighton Ruff MD; [id number] 9:21 AM)  General Mental Status-Alert. General Appearance-Not in acute distress. Build & Nutrition-Well nourished. Posture-Normal posture. Gait-Normal.  Head and Neck Head-normocephalic, atraumatic with no lesions or palpable masses. Trachea-midline.  Chest and Lung Exam Chest and lung exam reveals -normal excursion with symmetric chest walls.  Cardiovascular Cardiovascular examination reveals -normal heart sounds, regular rate and rhythm with no murmurs.  Abdomen Inspection Inspection of the abdomen reveals - No Hernias. Palpation/Percussion Palpation and Percussion of the abdomen reveal - Soft, Non Tender, No Rigidity (guarding), No hepatosplenomegaly and No Palpable abdominal masses.  Rectal Note: Multiple inflammatory skin tags noted. 1 seton posterior also without inflammation, purulence noted at anterior external drainage site  Neurologic Neurologic evaluation reveals -alert and oriented x 3 with no impairment of recent or remote memory, normal attention span and ability to concentrate, normal sensation and normal coordination.    Assessment & Plan Leighton Ruff MD; [id number] 9:15 AM)  ANAL  FISTULA (K60.3) Impression: 69 year old male who presents to the office for evaluation of multiple anal fistula. On exam today, he does appear to have recurrence of anterior fistula site where we performed his lift procedure. We discussed evaluating this in the operating room and performing a left on the posterior fistula tract. We then may be able to perform a fistulotomy on the anterior tract and preserve as much continence as we can. We discussed alternatives which would be repeating the sphincter preserving procedure on the anterior fistula tract. We discussed the typical recurrence rates with repeat procedures. Upon discussion, he was okay with performing a fistulotomy if it was felt to be needed as the best option for healing his fistula tracts. He understands that this may reduce his continence going forward.

## 2019-06-06 DIAGNOSIS — G8929 Other chronic pain: Secondary | ICD-10-CM | POA: Diagnosis not present

## 2019-06-06 DIAGNOSIS — Z008 Encounter for other general examination: Secondary | ICD-10-CM | POA: Diagnosis not present

## 2019-06-06 DIAGNOSIS — K5900 Constipation, unspecified: Secondary | ICD-10-CM | POA: Diagnosis not present

## 2019-06-06 DIAGNOSIS — I10 Essential (primary) hypertension: Secondary | ICD-10-CM | POA: Diagnosis not present

## 2019-06-06 DIAGNOSIS — N529 Male erectile dysfunction, unspecified: Secondary | ICD-10-CM | POA: Diagnosis not present

## 2019-06-06 DIAGNOSIS — E785 Hyperlipidemia, unspecified: Secondary | ICD-10-CM | POA: Diagnosis not present

## 2019-06-06 DIAGNOSIS — M1990 Unspecified osteoarthritis, unspecified site: Secondary | ICD-10-CM | POA: Diagnosis not present

## 2019-06-06 DIAGNOSIS — I2510 Atherosclerotic heart disease of native coronary artery without angina pectoris: Secondary | ICD-10-CM | POA: Diagnosis not present

## 2019-06-06 DIAGNOSIS — K219 Gastro-esophageal reflux disease without esophagitis: Secondary | ICD-10-CM | POA: Diagnosis not present

## 2019-06-06 DIAGNOSIS — N400 Enlarged prostate without lower urinary tract symptoms: Secondary | ICD-10-CM | POA: Diagnosis not present

## 2019-06-06 NOTE — Progress Notes (Signed)
Cardiology Office Note  Date: 06/07/2019   ID: Phillip Rios, Phillip Rios November 14, 1950, MRN [id number]  PCP:  Monico Blitz, MD  Cardiologist:  Carlyle Dolly, MD Electrophysiologist:  None   Chief Complaint: Follow-up CAD/chest pain  History of Present Illness: Phillip Rios is a 69 y.o. male with a history of CAD/chest pain, HTN.  Cardiac catheterization 2019 severe mid LAD stenosis with DES placed x1, normal LV function.  DAPT therapy started, statin started  Last encounter 11/03/2018 with Dr. Harl Bowie.  Plavix was stopped.  He was to continue all other medications.  BP was at goal continue current medications.  He was in need of anal fistula surgery and was okay to proceed per Dr. Harl Bowie.  Patient states he is doing well from a cardiac standpoint he denies any recent anginal or exertional symptoms, palpitations or arrhythmias, stroke or TIA-like symptoms, PND or orthopnea, lower extremity edema, claudication-like symptoms, DVT or PE-like symptoms.  He has undergone 1 surgery for multiple anal fistulas.  He states he has 1 more surgery for remaining anal fistula.  He has a fair amount of pain and takes ibuprofen and Tylenol.  Otherwise he denies any recent issues.  States he gets out and mows his yard without any issues.  He states he needs a refill on his sublingual nitroglycerin.   Past Medical History:  Diagnosis Date  . Anal fistula   . Arthritis    both knees  . Atrial fibrillation ([place]) 2018   short episode of afib, noted on event monitor  . Coronary artery disease    non obstructive  . Dyslipidemia   . GERD (gastroesophageal reflux disease)   . Hypertension   . Morbid obesity ([place])   . OSA on CPAP    does not use cpap, could not get cpap  . Prostate cancer ([place])    Stage IIC , prostate completed radiation 06/08/17  . PSVT (paroxysmal supraventricular tachycardia) ([place]) 2018    noted on event monitor    Past Surgical History:  Procedure Laterality Date  . ABDOMINAL  SURGERY     Stomach surgery for obstruction as a child  . CARDIAC CATHETERIZATION     cath  . COLONOSCOPY    . COLONOSCOPY N/A 12/21/2018   Procedure: COLONOSCOPY;  Surgeon: Rogene Houston, MD;  Location: [facility];  Service: Endoscopy;  Laterality: N/A;  100  . CORONARY STENT INTERVENTION  09/01/2017  . CORONARY STENT INTERVENTION N/A 09/01/2017   Procedure: CORONARY STENT INTERVENTION;  Surgeon: Burnell Blanks, MD;  Location: [facility];  Service: Cardiovascular;  Laterality: N/A;  . INTRAVASCULAR PRESSURE WIRE/FFR STUDY N/A 09/01/2017   Procedure: INTRAVASCULAR PRESSURE WIRE/FFR STUDY;  Surgeon: Burnell Blanks, MD;  Location: [facility];  Service: Cardiovascular;  Laterality: N/A;  . LEFT HEART CATH AND CORONARY ANGIOGRAPHY N/A 09/01/2017   Procedure: LEFT HEART CATH AND CORONARY ANGIOGRAPHY;  Surgeon: Burnell Blanks, MD;  Location: [facility];  Service: Cardiovascular;  Laterality: N/A;  . LIGATION OF INTERNAL FISTULA TRACT N/A 03/29/2019   Procedure: LIGATION OF ANTERIOR FISTULA TRACT;  Surgeon: Leighton Ruff, MD;  Location: [facility];  Service: General;  Laterality: N/A;  . PLACEMENT OF SETON  11/08/2018   Procedure: PLACEMENT OF SETON X 4 IN TRANSPHINCTERIC FISTULA;  Surgeon: Virl Cagey, MD;  Location: [facility];  Service: General;;    Current Outpatient Medications  Medication Sig Dispense Refill  . acetaminophen (TYLENOL) 500 MG  tablet Take 500 mg by mouth every 6 (six) hours as needed for moderate pain or headache.    Marland Kitchen amLODipine (NORVASC) 5 MG tablet Take 5 mg by mouth daily.    Marland Kitchen atorvastatin (LIPITOR) 80 MG tablet Take 1 tablet (80 mg total) by mouth daily at 6 PM. 90 tablet 3  . docusate sodium (COLACE) 100 MG capsule Take 200 mg by mouth daily as needed.     . hydrochlorothiazide (HYDRODIURIL) 25 MG tablet Take 25 mg by mouth daily.    Marland Kitchen lisinopril (PRINIVIL,ZESTRIL) 20 MG tablet Take 40 mg by mouth  daily.    . metoprolol succinate (TOPROL-XL) 50 MG 24 hr tablet Take 25 mg by mouth daily.    . nitroGLYCERIN (NITROSTAT) 0.4 MG SL tablet Place 1 tablet (0.4 mg total) under the tongue every 5 (five) minutes as needed for chest pain. 25 tablet 3  . pantoprazole (PROTONIX) 40 MG tablet Take 40 mg by mouth daily.  2  . tamsulosin (FLOMAX) 0.4 MG CAPS capsule Take 0.4 mg by mouth at bedtime.      No current facility-administered medications for this visit.   Allergies:  Patient has no known allergies.   Social History: The patient  reports that he has never smoked. His smokeless tobacco use includes chew. He reports that he does not drink alcohol or use drugs.   Family History: The patient's family history includes Heart disease in his father; Heart failure in his father.   ROS:  Please see the history of present illness. Otherwise, complete review of systems is positive for none.  All other systems are reviewed and negative.   Physical Exam: VS:  BP 114/68   Pulse 87   Ht 6\' 1"  (1.854 m)   Wt (!) 349 lb (158.3 kg)   SpO2 98%   BMI 46.04 kg/m , BMI Body mass index is 46.04 kg/m.  Wt Readings from Last 3 Encounters:  06/07/19 (!) 349 lb (158.3 kg)  03/29/19 (!) 331 lb (150.1 kg)  01/10/19 (!) 319 lb (144.7 kg)    General: Patient appears comfortable at rest. Neck: Supple, no elevated JVP or carotid bruits, no thyromegaly. Lungs: Clear to auscultation, nonlabored breathing at rest. Cardiac: Regular rate and rhythm, no S3 or significant systolic murmur, no pericardial rub. Extremities: No pitting edema, distal pulses 2+. Skin: Warm and dry. Musculoskeletal: No kyphosis. Neuropsychiatric: Alert and oriented x3, affect grossly appropriate.  ECG:    Recent Labwork: 09/14/2018: ALT 29; AST 21; Platelets 268 03/29/2019: BUN 18; Creatinine, Ser 1.30; Hemoglobin 15.0; Potassium 3.3; Sodium 140     Component Value Date/Time   CHOL 156 09/02/2017 0440   TRIG 265 (H) 09/02/2017 0440    HDL 28 (L) 09/02/2017 0440   CHOLHDL 5.6 09/02/2017 0440   VLDL 53 (H) 09/02/2017 0440   LDLCALC 75 09/02/2017 0440    Other Studies Reviewed Today:  Diagnostic Studies 08/2017 cath  Prox LAD lesion is 80% stenosed.  A drug-eluting stent was successfully placed using a STENT SIERRA 2.75 X 12 MM.  Post intervention, there is a 0% residual stenosis.  The left ventricular systolic function is normal.  LV end diastolic pressure is normal.  The left ventricular ejection fraction is 55-65% by visual estimate.  There is no mitral valve regurgitation.  1. Severe stenosis mid LAD. The stenosis was eccentric and in some views did not appear to be severe. FFR 0.8 in the mid LAD suggesting the stenosis was likely flow limiting. Given the  abnormal FFR and the abnormal nuclear stress test, PCI was performed.  2. Successful PTCA/DES x 1 mid LAD 3. Normal LV systolic function  Post cathRecommendations: Will start a statin.  Recommend uninterrupted dual antiplatelet therapy with Aspirin 81mg  daily and Clopidogrel 75mg  dailyfor a minimum of 12 months (ACS - Class I recommendation).  06/2016 echo Study Conclusions  - Procedure narrative: Transthoracic echocardiography. Image quality was suboptimal with poor valvular and endocardial visualization in several views. - Left ventricle: The cavity size was normal. Wall thickness was increased in a pattern of mild LVH. Systolic function was vigorous. The estimated ejection fraction was in the range of 65% to 70%. Images were inadequate for LV wall motion assessment. Doppler parameters are consistent with abnormal left ventricular relaxation (grade 1 diastolic dysfunction). - Aorta: Mild aortic root enlargement.  Assessment and Plan:   1. CAD in native artery   2. Essential hypertension    1. CAD in native artery He denies any progressive anginal or exertional symptoms.  Continue nitroglycerin 0.4 mg sublingual.   Patient needs a refill please refill his sublingual nitroglycerin.  Continue atorvastatin 80 mg daily.  2. Essential hypertension Blood pressure well controlled on current medications.  Continue HCTZ 25 mg daily, lisinopril 40 mg daily, Toprol-XL 25 mg daily, amlodipine 5 mg.   Medication Adjustments/Labs and Tests Ordered: Current medicines are reviewed at length with the patient today.  Concerns regarding medicines are outlined above.   Disposition: Follow-up with Dr. Harl Bowie or APP 6 months.  Signed, Levell July, NP 06/07/2019 12:05 PM    [facility] [place], [place], [place] [address] Phone: [phone]; Fax: [phone]

## 2019-06-07 ENCOUNTER — Encounter: Payer: Self-pay | Admitting: Family Medicine

## 2019-06-07 ENCOUNTER — Other Ambulatory Visit: Payer: Self-pay

## 2019-06-07 ENCOUNTER — Ambulatory Visit (INDEPENDENT_AMBULATORY_CARE_PROVIDER_SITE_OTHER): Payer: Medicare HMO | Admitting: Family Medicine

## 2019-06-07 VITALS — BP 114/68 | HR 87 | Ht 73.0 in | Wt 349.0 lb

## 2019-06-07 DIAGNOSIS — I10 Essential (primary) hypertension: Secondary | ICD-10-CM

## 2019-06-07 DIAGNOSIS — I2510 Atherosclerotic heart disease of native coronary artery without angina pectoris: Secondary | ICD-10-CM

## 2019-06-07 MED ORDER — NITROGLYCERIN 0.4 MG SL SUBL: 0.4000 mg | SUBLINGUAL_TABLET | SUBLINGUAL | 3 refills | Status: DC | PRN

## 2019-06-07 NOTE — Patient Instructions (Signed)
Medication Instructions:  Continue all current medications.   Labwork: none  Testing/Procedures: none  Follow-Up: 6 months   Any Other Special Instructions Will Be Listed Below (If Applicable).   If you need a refill on your cardiac medications before your next appointment, please call your pharmacy.  

## 2019-06-08 ENCOUNTER — Encounter (HOSPITAL_BASED_OUTPATIENT_CLINIC_OR_DEPARTMENT_OTHER): Payer: Self-pay | Admitting: General Surgery

## 2019-06-08 ENCOUNTER — Other Ambulatory Visit: Payer: Self-pay

## 2019-06-08 NOTE — Progress Notes (Addendum)
Addendum: spoke with Konrad Felix pa patient meets [facility] ok to proceed.  Spoke w/ via phone for pre-op interview---spouse toni Lab needs dos----   I stat 8          COVID test ------06-12-2019@1215  pm Arrive at -------1230 pm 06-15-2019 No food after midnight, clear liquids from midnight until 830 am then npo Medications to take morning of surgery -----amlodipine, metorprolol succinate, pantaprazole Diabetic medication -----n/a Patient Special Instructions -----none Pre-Op special Istructions -----none Patient verbalized understanding of instructions that were given at this phone interview. Patient denies shortness of breath, chest pain, fever, cough a this phone interview.  Anesthesia : cad Chart to Janett Billow zanetto pa for review  PCP: dr a shaw Cardiologist : dr Roderic Palau branch, [facility]ology andrew quinn np 06-07-2019 Chest x-ray :none EKG :11-06-2018 epic Echo :07-04-08`8 epic Cardiac Cath : 09-01-2017 epic Sleep Study/ CPAP :not used due to could not get cpap, last sleep study 4 or 5 yrs ago Fasting Blood Sugar :      / Checks Blood Sugar -- times a day:  n/a Blood Thinner/ Instructions /Last Dose:n/a ASA / Instructions/ Last Dose : patient staying on 81 mg aspirin per dr Marcello Moores instructions  Patient denies shortness of breath, chest pain, fever, and cough at this phone interview.

## 2019-06-12 ENCOUNTER — Other Ambulatory Visit (HOSPITAL_COMMUNITY)
Admission: RE | Admit: 2019-06-12 | Discharge: 2019-06-12 | Disposition: A | Discharge status: Home / Self Care | Payer: Medicare HMO | Source: Ambulatory Visit | Attending: General Surgery | Admitting: General Surgery

## 2019-06-12 ENCOUNTER — Other Ambulatory Visit (HOSPITAL_COMMUNITY): Payer: Medicare HMO

## 2019-06-12 DIAGNOSIS — Z01812 Encounter for preprocedural laboratory examination: Secondary | ICD-10-CM | POA: Diagnosis not present

## 2019-06-12 DIAGNOSIS — Z20822 Contact with and (suspected) exposure to covid-19: Secondary | ICD-10-CM | POA: Diagnosis not present

## 2019-06-12 LAB — SARS CORONAVIRUS 2 (TAT 6-24 HRS): SARS Coronavirus 2: NEGATIVE

## 2019-06-15 ENCOUNTER — Ambulatory Visit (HOSPITAL_BASED_OUTPATIENT_CLINIC_OR_DEPARTMENT_OTHER): Payer: Medicare HMO | Admitting: Physician Assistant

## 2019-06-15 ENCOUNTER — Ambulatory Visit (HOSPITAL_BASED_OUTPATIENT_CLINIC_OR_DEPARTMENT_OTHER)
Admission: RE | Admit: 2019-06-15 | Discharge: 2019-06-15 | Disposition: A | Discharge status: Home / Self Care | Payer: Medicare HMO | Attending: General Surgery | Admitting: General Surgery

## 2019-06-15 ENCOUNTER — Encounter (HOSPITAL_BASED_OUTPATIENT_CLINIC_OR_DEPARTMENT_OTHER)
Admission: RE | Disposition: A | Discharge status: Home / Self Care | Payer: Self-pay | Source: Home / Self Care | Attending: General Surgery

## 2019-06-15 ENCOUNTER — Encounter (HOSPITAL_BASED_OUTPATIENT_CLINIC_OR_DEPARTMENT_OTHER): Payer: Self-pay | Admitting: General Surgery

## 2019-06-15 ENCOUNTER — Other Ambulatory Visit: Payer: Self-pay

## 2019-06-15 DIAGNOSIS — G4730 Sleep apnea, unspecified: Secondary | ICD-10-CM | POA: Insufficient documentation

## 2019-06-15 DIAGNOSIS — K219 Gastro-esophageal reflux disease without esophagitis: Secondary | ICD-10-CM | POA: Insufficient documentation

## 2019-06-15 DIAGNOSIS — G4733 Obstructive sleep apnea (adult) (pediatric): Secondary | ICD-10-CM | POA: Diagnosis not present

## 2019-06-15 DIAGNOSIS — K603 Anal fistula: Secondary | ICD-10-CM | POA: Diagnosis not present

## 2019-06-15 DIAGNOSIS — I10 Essential (primary) hypertension: Secondary | ICD-10-CM | POA: Diagnosis not present

## 2019-06-15 DIAGNOSIS — E7800 Pure hypercholesterolemia, unspecified: Secondary | ICD-10-CM | POA: Diagnosis not present

## 2019-06-15 DIAGNOSIS — Z8546 Personal history of malignant neoplasm of prostate: Secondary | ICD-10-CM | POA: Insufficient documentation

## 2019-06-15 DIAGNOSIS — N400 Enlarged prostate without lower urinary tract symptoms: Secondary | ICD-10-CM | POA: Insufficient documentation

## 2019-06-15 HISTORY — PX: EVALUATION UNDER ANESTHESIA WITH ANAL FISSUROTOMY: SHX5622

## 2019-06-15 LAB — POCT I-STAT, CHEM 8
BUN: 29 mg/dL — ABNORMAL HIGH (ref 8–23)
Calcium, Ion: 1.26 mmol/L (ref 1.15–1.40)
Chloride: 101 mmol/L (ref 98–111)
Creatinine, Ser: 1.7 mg/dL — ABNORMAL HIGH (ref 0.61–1.24)
Glucose, Bld: 99 mg/dL (ref 70–99)
HCT: 42 % (ref 39.0–52.0)
Hemoglobin: 14.3 g/dL (ref 13.0–17.0)
Potassium: 4.1 mmol/L (ref 3.5–5.1)
Sodium: 139 mmol/L (ref 135–145)
TCO2: 25 mmol/L (ref 22–32)

## 2019-06-15 SURGERY — EXAM UNDER ANESTHESIA WITH ANAL FISSUROTOMY
Anesthesia: General | Site: Rectum

## 2019-06-15 MED ORDER — LIDOCAINE 2% (20 MG/ML) 5 ML SYRINGE
INTRAMUSCULAR | Status: AC
  Filled 2019-06-15: qty 5

## 2019-06-15 MED ORDER — OXYCODONE HCL 5 MG/5ML PO SOLN: 5.0000 mg | Freq: Once | ORAL | Status: DC | PRN

## 2019-06-15 MED ORDER — OXYCODONE HCL 5 MG PO TABS: 5.0000 mg | ORAL_TABLET | Freq: Once | ORAL | Status: DC | PRN

## 2019-06-15 MED ORDER — DEXAMETHASONE SODIUM PHOSPHATE 10 MG/ML IJ SOLN
INTRAMUSCULAR | Status: AC
  Filled 2019-06-15: qty 1

## 2019-06-15 MED ORDER — ONDANSETRON HCL 4 MG/2ML IJ SOLN
INTRAMUSCULAR | Status: AC
  Filled 2019-06-15: qty 2

## 2019-06-15 MED ORDER — ACETAMINOPHEN 500 MG PO TABS
ORAL_TABLET | ORAL | Status: AC
  Filled 2019-06-15: qty 2

## 2019-06-15 MED ORDER — BUPIVACAINE LIPOSOME 1.3 % IJ SUSP: 20.0000 mL | Freq: Once | INTRAMUSCULAR | Status: DC

## 2019-06-15 MED ORDER — PROPOFOL 10 MG/ML IV BOLUS
INTRAVENOUS | Status: DC | PRN
  Administered 2019-06-15: 200 mg via INTRAVENOUS

## 2019-06-15 MED ORDER — OXYCODONE HCL 5 MG PO TABS: 5.0000 mg | ORAL_TABLET | Freq: Four times a day (QID) | ORAL | 0 refills | Status: DC | PRN

## 2019-06-15 MED ORDER — FENTANYL CITRATE (PF) 100 MCG/2ML IJ SOLN
INTRAMUSCULAR | Status: DC | PRN
  Administered 2019-06-15: 100 ug via INTRAVENOUS

## 2019-06-15 MED ORDER — SODIUM CHLORIDE 0.9% FLUSH: 3.0000 mL | Freq: Two times a day (BID) | INTRAVENOUS | Status: DC

## 2019-06-15 MED ORDER — MIDAZOLAM HCL 5 MG/5ML IJ SOLN
INTRAMUSCULAR | Status: DC | PRN
  Administered 2019-06-15: 2 mg via INTRAVENOUS

## 2019-06-15 MED ORDER — FENTANYL CITRATE (PF) 100 MCG/2ML IJ SOLN: 25.0000 ug | INTRAMUSCULAR | Status: DC | PRN

## 2019-06-15 MED ORDER — SODIUM CHLORIDE 0.9 % IV SOLN: 250.0000 mL | INTRAVENOUS | Status: DC | PRN

## 2019-06-15 MED ORDER — ROCURONIUM BROMIDE 10 MG/ML (PF) SYRINGE
PREFILLED_SYRINGE | INTRAVENOUS | Status: AC
  Filled 2019-06-15: qty 10

## 2019-06-15 MED ORDER — BUPIVACAINE-EPINEPHRINE 0.5% -1:200000 IJ SOLN
INTRAMUSCULAR | Status: DC | PRN
  Administered 2019-06-15: 30 mL

## 2019-06-15 MED ORDER — ROCURONIUM BROMIDE 10 MG/ML (PF) SYRINGE
PREFILLED_SYRINGE | INTRAVENOUS | Status: DC | PRN
  Administered 2019-06-15: 30 mg via INTRAVENOUS

## 2019-06-15 MED ORDER — GABAPENTIN 300 MG PO CAPS
ORAL_CAPSULE | ORAL | Status: AC
  Filled 2019-06-15: qty 1

## 2019-06-15 MED ORDER — ACETAMINOPHEN 500 MG PO TABS
1000.0000 mg | ORAL_TABLET | ORAL | Status: AC
  Administered 2019-06-15: 1000 mg via ORAL

## 2019-06-15 MED ORDER — EPHEDRINE 5 MG/ML INJ
INTRAVENOUS | Status: AC
  Filled 2019-06-15: qty 10

## 2019-06-15 MED ORDER — SODIUM CHLORIDE 0.9% FLUSH: 3.0000 mL | INTRAVENOUS | Status: DC | PRN

## 2019-06-15 MED ORDER — OXYCODONE HCL 5 MG PO TABS: 5.0000 mg | ORAL_TABLET | ORAL | Status: DC | PRN

## 2019-06-15 MED ORDER — MIDAZOLAM HCL 2 MG/2ML IJ SOLN
INTRAMUSCULAR | Status: AC
  Filled 2019-06-15: qty 2

## 2019-06-15 MED ORDER — PROPOFOL 10 MG/ML IV BOLUS
INTRAVENOUS | Status: AC
  Filled 2019-06-15: qty 20

## 2019-06-15 MED ORDER — ACETAMINOPHEN 325 MG RE SUPP: 650.0000 mg | RECTAL | Status: DC | PRN

## 2019-06-15 MED ORDER — KETOROLAC TROMETHAMINE 30 MG/ML IJ SOLN: 30.0000 mg | Freq: Once | INTRAMUSCULAR | Status: DC | PRN

## 2019-06-15 MED ORDER — LIDOCAINE 2% (20 MG/ML) 5 ML SYRINGE
INTRAMUSCULAR | Status: DC | PRN
  Administered 2019-06-15: 100 mg via INTRAVENOUS

## 2019-06-15 MED ORDER — EPHEDRINE SULFATE-NACL 50-0.9 MG/10ML-% IV SOSY
PREFILLED_SYRINGE | INTRAVENOUS | Status: DC | PRN
  Administered 2019-06-15: 5 mg via INTRAVENOUS

## 2019-06-15 MED ORDER — SUCCINYLCHOLINE CHLORIDE 200 MG/10ML IV SOSY
PREFILLED_SYRINGE | INTRAVENOUS | Status: DC | PRN
  Administered 2019-06-15: 140 mg via INTRAVENOUS

## 2019-06-15 MED ORDER — ONDANSETRON HCL 4 MG/2ML IJ SOLN
INTRAMUSCULAR | Status: DC | PRN
  Administered 2019-06-15: 4 mg via INTRAVENOUS

## 2019-06-15 MED ORDER — SUCCINYLCHOLINE CHLORIDE 200 MG/10ML IV SOSY
PREFILLED_SYRINGE | INTRAVENOUS | Status: AC
  Filled 2019-06-15: qty 10

## 2019-06-15 MED ORDER — GABAPENTIN 300 MG PO CAPS
300.0000 mg | ORAL_CAPSULE | ORAL | Status: AC
  Administered 2019-06-15: 300 mg via ORAL

## 2019-06-15 MED ORDER — SUGAMMADEX SODIUM 500 MG/5ML IV SOLN
INTRAVENOUS | Status: AC
  Filled 2019-06-15: qty 5

## 2019-06-15 MED ORDER — BUPIVACAINE LIPOSOME 1.3 % IJ SUSP
INTRAMUSCULAR | Status: DC | PRN
  Administered 2019-06-15: 20 mL

## 2019-06-15 MED ORDER — LACTATED RINGERS IV SOLN: INTRAVENOUS | Status: DC

## 2019-06-15 MED ORDER — DEXAMETHASONE SODIUM PHOSPHATE 10 MG/ML IJ SOLN
INTRAMUSCULAR | Status: DC | PRN
  Administered 2019-06-15: 10 mg via INTRAVENOUS

## 2019-06-15 MED ORDER — SUGAMMADEX SODIUM 500 MG/5ML IV SOLN
INTRAVENOUS | Status: DC | PRN
  Administered 2019-06-15: 300 mg via INTRAVENOUS

## 2019-06-15 MED ORDER — LIDOCAINE 5 % EX OINT
TOPICAL_OINTMENT | CUTANEOUS | Status: DC | PRN
  Administered 2019-06-15: 1

## 2019-06-15 MED ORDER — ACETAMINOPHEN 325 MG PO TABS: 650.0000 mg | ORAL_TABLET | ORAL | Status: DC | PRN

## 2019-06-15 MED ORDER — FENTANYL CITRATE (PF) 100 MCG/2ML IJ SOLN
INTRAMUSCULAR | Status: AC
  Filled 2019-06-15: qty 2

## 2019-06-15 SURGICAL SUPPLY — 57 items
BENZOIN TINCTURE PRP APPL 2/3 (GAUZE/BANDAGES/DRESSINGS) ×3 IMPLANT
BLADE EXTENDED COATED 6.5IN (ELECTRODE) IMPLANT
BLADE HEX COATED 2.75 (ELECTRODE) ×3 IMPLANT
BLADE SURG 10 STRL SS (BLADE) ×3 IMPLANT
BLADE SURG 15 STRL LF DISP TIS (BLADE) ×1 IMPLANT
BLADE SURG 15 STRL SS (BLADE) ×2
BRIEF STRETCH FOR OB PAD LRG (UNDERPADS AND DIAPERS) ×3 IMPLANT
CANISTER SUCT 3000ML PPV (MISCELLANEOUS) ×3 IMPLANT
COVER BACK TABLE 60X90IN (DRAPES) ×3 IMPLANT
COVER MAYO STAND STRL (DRAPES) ×3 IMPLANT
COVER WAND RF STERILE (DRAPES) ×3 IMPLANT
DRAPE LAPAROTOMY 100X72 PEDS (DRAPES) ×3 IMPLANT
DRAPE UTILITY XL STRL (DRAPES) ×3 IMPLANT
DRSG PAD ABDOMINAL 8X10 ST (GAUZE/BANDAGES/DRESSINGS) ×3 IMPLANT
ELECT REM PT RETURN 9FT ADLT (ELECTROSURGICAL) ×3
ELECTRODE REM PT RTRN 9FT ADLT (ELECTROSURGICAL) ×1 IMPLANT
GAUZE SPONGE 4X4 12PLY STRL (GAUZE/BANDAGES/DRESSINGS) ×3 IMPLANT
GAUZE SPONGE 4X4 12PLY STRL LF (GAUZE/BANDAGES/DRESSINGS) ×3 IMPLANT
GLOVE BIO SURGEON STRL SZ 6.5 (GLOVE) ×2 IMPLANT
GLOVE BIO SURGEONS STRL SZ 6.5 (GLOVE) ×1
GLOVE BIOGEL PI IND STRL 7.0 (GLOVE) ×1 IMPLANT
GLOVE BIOGEL PI INDICATOR 7.0 (GLOVE) ×2
HYDROGEN PEROXIDE 16OZ (MISCELLANEOUS) ×3 IMPLANT
IV CATH 14GX2 1/4 (CATHETERS) IMPLANT
IV CATH 18G SAFETY (IV SOLUTION) IMPLANT
KIT SIGMOIDOSCOPE (SET/KITS/TRAYS/PACK) IMPLANT
KIT TURNOVER CYSTO (KITS) ×3 IMPLANT
NEEDLE HYPO 22GX1.5 SAFETY (NEEDLE) ×3 IMPLANT
NS IRRIG 500ML POUR BTL (IV SOLUTION) ×3 IMPLANT
PAD ARMBOARD 7.5X6 YLW CONV (MISCELLANEOUS) IMPLANT
PENCIL BUTTON HOLSTER BLD 10FT (ELECTRODE) ×3 IMPLANT
RETRACTOR STAY HOOK 5MM (MISCELLANEOUS) ×3 IMPLANT
RETRACTOR STERILE 25.8CMX11.3 (INSTRUMENTS) ×3 IMPLANT
SET BASIN DAY SURGERY F.S. (CUSTOM PROCEDURE TRAY) ×3 IMPLANT
SPONGE SURGIFOAM ABS GEL 12-7 (HEMOSTASIS) IMPLANT
SUCTION FRAZIER HANDLE 10FR (MISCELLANEOUS) ×2
SUCTION TUBE FRAZIER 10FR DISP (MISCELLANEOUS) ×1 IMPLANT
SUT CHROMIC 2 0 SH (SUTURE) ×3 IMPLANT
SUT CHROMIC 3 0 SH 27 (SUTURE) IMPLANT
SUT SILK 2 0 (SUTURE) ×2
SUT SILK 2 0 SH CR/8 (SUTURE) ×3 IMPLANT
SUT SILK 2-0 18XBRD TIE 12 (SUTURE) ×1 IMPLANT
SUT SILK 3 0 SH 30 (SUTURE) ×3 IMPLANT
SUT VIC AB 2-0 SH 27 (SUTURE)
SUT VIC AB 2-0 SH 27XBRD (SUTURE) IMPLANT
SUT VIC AB 3-0 SH 27 (SUTURE) ×2
SUT VIC AB 3-0 SH 27X BRD (SUTURE) ×1 IMPLANT
SUT VIC AB 3-0 SH 8-18 (SUTURE) ×3 IMPLANT
SUT VICRYL 3 0 UR 6 27 (SUTURE) IMPLANT
SYR 10ML LL (SYRINGE) ×3 IMPLANT
SYR BULB IRRIG 60ML STRL (SYRINGE) ×3 IMPLANT
SYR CONTROL 10ML LL (SYRINGE) ×3 IMPLANT
TOWEL OR 17X26 10 PK STRL BLUE (TOWEL DISPOSABLE) ×3 IMPLANT
TRAY DSU PREP LF (CUSTOM PROCEDURE TRAY) ×3 IMPLANT
TUBE CONNECTING 12'X1/4 (SUCTIONS) ×1
TUBE CONNECTING 12X1/4 (SUCTIONS) ×2 IMPLANT
YANKAUER SUCT BULB TIP NO VENT (SUCTIONS) IMPLANT

## 2019-06-15 NOTE — Transfer of Care (Signed)
Immediate Anesthesia Transfer of Care Note  Patient: Phillip Rios  Procedure(s) Performed: ANAL EXAM UNDER ANESTHESIA, LIGATION OF INTERNAL FISTULA TRACT, FISTULOTOMY (N/A Rectum)  Patient Location: PACU  Anesthesia Type:General  Level of Consciousness: awake, alert  and oriented  Airway & Oxygen Therapy: Patient Spontanous Breathing and Patient connected to nasal cannula oxygen  Post-op Assessment: Report given to RN and Post -op Vital signs reviewed and stable  Post vital signs: Reviewed and stable  Last Vitals:  Vitals Value Taken Time  BP 160/88 06/15/19 1500  Temp 36.6 C 06/15/19 1459  Pulse 79 06/15/19 1501  Resp 13 06/15/19 1501  SpO2 97 % 06/15/19 1501  Vitals shown include unvalidated device data.  Last Pain:  Vitals:   06/15/19 1247  TempSrc: Oral  PainSc: 5       Patients Stated Pain Goal: 5 ([id number])  Complications: No apparent anesthesia complications

## 2019-06-15 NOTE — Anesthesia Procedure Notes (Signed)
Procedure Name: Intubation Date/Time: 06/15/2019 1:37 PM Performed by: Kynsleigh Westendorf D, CRNA Pre-anesthesia Checklist: Patient identified, Emergency Drugs available, Suction available and Patient being monitored Patient Re-evaluated:Patient Re-evaluated prior to induction Oxygen Delivery Method: Circle system utilized Preoxygenation: Pre-oxygenation with 100% oxygen Induction Type: IV induction Laryngoscope Size: Mac and 4 Grade View: Grade I Tube type: Oral Tube size: 7.5 mm Number of attempts: 1 Airway Equipment and Method: Stylet Placement Confirmation: ETT inserted through vocal cords under direct vision,  positive ETCO2 and breath sounds checked- equal and bilateral Secured at: 22 cm Tube secured with: Tape Dental Injury: Teeth and Oropharynx as per pre-operative assessment

## 2019-06-15 NOTE — Anesthesia Preprocedure Evaluation (Signed)
Anesthesia Evaluation  Patient identified by MRN, date of birth, ID band Patient awake    Reviewed: Allergy & Precautions, NPO status , Patient's Chart, lab work & pertinent test results  Airway Mallampati: II  TM Distance: >3 FB Neck ROM: Full    Dental no notable dental hx.    Pulmonary sleep apnea and Continuous Positive Airway Pressure Ventilation ,    Pulmonary exam normal breath sounds clear to auscultation       Cardiovascular hypertension, Normal cardiovascular exam Rhythm:Regular Rate:Normal     Neuro/Psych negative neurological ROS  negative psych ROS   GI/Hepatic Neg liver ROS, GERD  Medicated,  Endo/Other  negative endocrine ROS  Renal/GU negative Renal ROS  negative genitourinary   Musculoskeletal negative musculoskeletal ROS (+)   Abdominal   Peds negative pediatric ROS (+)  Hematology negative hematology ROS (+)   Anesthesia Other Findings   Reproductive/Obstetrics negative OB ROS                             Anesthesia Physical Anesthesia Plan  ASA: III  Anesthesia Plan: General   Post-op Pain Management:    Induction: Intravenous  PONV Risk Score and Plan: 2 and Ondansetron, Dexamethasone and Treatment may vary due to age or medical condition  Airway Management Planned: Oral ETT  Additional Equipment:   Intra-op Plan:   Post-operative Plan: Extubation in OR  Informed Consent: I have reviewed the patients History and Physical, chart, labs and discussed the procedure including the risks, benefits and alternatives for the proposed anesthesia with the patient or authorized representative who has indicated his/her understanding and acceptance.     Dental advisory given  Plan Discussed with: CRNA and Surgeon  Anesthesia Plan Comments:         Anesthesia Quick Evaluation

## 2019-06-15 NOTE — Discharge Instructions (Addendum)
ANORECTAL SURGERY: POST OP INSTRUCTIONS 1. Take your usually prescribed home medications unless otherwise directed. 2. DIET: During the first few hours after surgery sip on some liquids until you are able to urinate.  It is normal to not urinate for several hours after this surgery.  If you feel uncomfortable, please contact the office for instructions.  After you are able to urinate,you may eat, if you feel like it.  Follow a light bland diet the first 24 hours after arrival home, such as soup, liquids, crackers, etc.  Be sure to include lots of fluids daily (6-8 glasses).  Avoid fast food or heavy meals, as your are more likely to get nauseated.  Eat a low fat diet the next few days after surgery.  Limit caffeine intake to 1-2 servings a day. 3. PAIN CONTROL: a. Pain is best controlled by a usual combination of several different methods TOGETHER: i. Muscle relaxation: Soak in a warm bath (or Sitz bath) three times a day and after bowel movements.  Continue to do this until all pain is resolved. ii. Over the counter pain medication iii. Prescription pain medication b. Most patients will experience some swelling and discomfort in the anus/rectal area and incisions.  Heat such as warm towels, sitz baths, warm baths, etc to help relax tight/sore spots and speed recovery.  Some people prefer to use ice, especially in the first couple days after surgery, as it may decrease the pain and swelling, or alternate between ice & heat.  Experiment to what works for you.  Swelling and bruising can take several weeks to resolve.  Pain can take even longer to completely resolve. c. It is helpful to take an over-the-counter pain medication regularly for the first few weeks.  Choose one of the following that works best for you: i. Naproxen (Aleve, etc)  Two 220mg tabs twice a day ii. Ibuprofen (Advil, etc) Three 200mg tabs four times a day (every meal & bedtime) d. A  prescription for pain medication (such as percocet,  oxycodone, hydrocodone, etc) should be given to you upon discharge.  Take your pain medication as prescribed.  i. If you are having problems/concerns with the prescription medicine (does not control pain, nausea, vomiting, rash, itching, etc), please call us [phone] to see if we need to switch you to a different pain medicine that will work better for you and/or control your side effect better. ii. If you need a refill on your pain medication, please contact your pharmacy.  They will contact our office to request authorization. Prescriptions will not be filled after 5 pm or on week-ends. 4. KEEP YOUR BOWELS REGULAR and AVOID CONSTIPATION a. The goal is one to two soft bowel movements a day.  You should at least have a bowel movement every other day. b. Avoid getting constipated.  Between the surgery and the pain medications, it is common to experience some constipation. This can be very painful after rectal surgery.  Increasing fluid intake and taking a fiber supplement (such as Metamucil, Citrucel, FiberCon, etc) 1-2 times a day regularly will usually help prevent this problem from occurring.  A stool softener like colace is also recommended.  This can be purchased over the counter at your pharmacy.  You can take it up to 3 times a day.  If you do not have a bowel movement after 24 hrs since your surgery, take one does of milk of magnesia.  If you still haven't had a bowel movement 8-12 hours after   that dose, take another dose.  If you don't have a bowel movement 48 hrs after surgery, purchase a Fleets enema from the drug store and administer gently per package instructions.  If you still are having trouble with your bowel movements after that, please call the office for further instructions. c. If you develop diarrhea or have many loose bowel movements, simplify your diet to bland foods & liquids for a few days.  Stop any stool softeners and decrease your fiber supplement.  Switching to mild  anti-diarrheal medications (Kayopectate, Pepto Bismol) can help.  If this worsens or does not improve, please call us.  5. Wound Care a. Remove your bandages before your first bowel movement or 8 hours after surgery.     b. Remove any wound packing material at this tim,e as well.  You do not need to repack the wound unless instructed otherwise.  Wear an absorbent pad or soft cotton gauze in your underwear to catch any drainage and help keep the area clean. You should change this every 2-3 hours while awake. c. Keep the area clean and dry.  Bathe / shower every day, especially after bowel movements.  Keep the area clean by showering / bathing over the incision / wound.   It is okay to soak an open wound to help wash it.  Wet wipes or showers / gentle washing after bowel movements is often less traumatic than regular toilet paper. d. You may have some styrofoam-like soft packing in the rectum which will come out with the first bowel movement.  e. You will often notice bleeding with bowel movements.  This should slow down by the end of the first week of surgery f. Expect some drainage.  This should slow down, too, by the end of the first week of surgery.  Wear an absorbent pad or soft cotton gauze in your underwear until the drainage stops. g. Do Not sit on a rubber or pillow ring.  This can make you symptoms worse.  You may sit on a soft pillow if needed.  6. ACTIVITIES as tolerated:   a. You may resume regular (light) daily activities beginning the next day--such as daily self-care, walking, climbing stairs--gradually increasing activities as tolerated.  If you can walk 30 minutes without difficulty, it is safe to try more intense activity such as jogging, treadmill, bicycling, low-impact aerobics, swimming, etc. b. Save the most intensive and strenuous activity for last such as sit-ups, heavy lifting, contact sports, etc  Refrain from any heavy lifting or straining until you are off narcotics for pain  control.   c. You may drive when you are no longer taking prescription pain medication, you can comfortably sit for long periods of time, and you can safely maneuver your car and apply brakes. d. You may have sexual intercourse when it is comfortable.  7. FOLLOW UP in our office a. Please call [facility] at [phone] to set up an appointment to see your surgeon in the office for a follow-up appointment approximately 3-4 weeks after your surgery. b. Make sure that you call for this appointment the day you arrive home to insure a convenient appointment time. 10. IF YOU HAVE DISABILITY OR FAMILY LEAVE FORMS, BRING THEM TO THE OFFICE FOR PROCESSING.  DO NOT GIVE THEM TO YOUR DOCTOR.     WHEN TO CALL US [phone]: 1. Poor pain control 2. Reactions / problems with new medications (rash/itching, nausea, etc)  3. Fever over 101.5 F (38.5 C) 4.   Inability to urinate 5. Nausea and/or vomiting 6. Worsening swelling or bruising 7. Continued bleeding from incision. 8. Increased pain, redness, or drainage from the incision  The clinic staff is available to answer your questions during regular business hours (8:30am-5pm).  Please don't hesitate to call and ask to speak to one of our nurses for clinical concerns.   A surgeon from [facility] is always on call at the hospitals   If you have a medical emergency, go to the nearest emergency room or call 911.    [facility] [address], [address], [place], [place]  [address] ? MAIN: [phone] ? TOLL FREE: [phone] ? FAX [phone] [email or web address]  Information for Discharge Teaching: EXPAREL (bupivacaine liposome injectable suspension)   Your surgeon gave you EXPAREL(bupivacaine) in your surgical incision to help control your pain after surgery.   EXPAREL is a local anesthetic that provides pain relief by numbing the tissue around the surgical site.  EXPAREL is designed to release  pain medication over time and can control pain for up to 72 hours.  Depending on how you respond to EXPAREL, you may require less pain medication during your recovery.  Possible side effects:  Temporary loss of sensation or ability to move in the area where bupivacaine was injected.  Nausea, vomiting, constipation  Rarely, numbness and tingling in your mouth or lips, lightheadedness, or anxiety may occur.  Call your doctor right away if you think you may be experiencing any of these sensations, or if you have other questions regarding possible side effects.  Follow all other discharge instructions given to you by your surgeon or nurse. Eat a healthy diet and drink plenty of water or other fluids.  If you return to the hospital for any reason within 96 hours following the administration of EXPAREL, please inform your health care providers. Post Anesthesia Home Care Instructions  Activity: Get plenty of rest for the remainder of the day. A responsible adult should stay with you for 24 hours following the procedure.  For the next 24 hours, DO NOT: -Drive a car -Operate machinery -Drink alcoholic beverages -Take any medication unless instructed by your physician -Make any legal decisions or sign important papers.  Meals: Start with liquid foods such as gelatin or soup. Progress to regular foods as tolerated. Avoid greasy, spicy, heavy foods. If nausea and/or vomiting occur, drink only clear liquids until the nausea and/or vomiting subsides. Call your physician if vomiting continues.  Special Instructions/Symptoms: Your throat may feel dry or sore from the anesthesia or the breathing tube placed in your throat during surgery. If this causes discomfort, gargle with warm salt water. The discomfort should disappear within 24 hours.  If you had a scopolamine patch placed behind your ear for the management of post- operative nausea and/or vomiting:  1. The medication in the patch is  effective for 72 hours, after which it should be removed.  Wrap patch in a tissue and discard in the trash. Wash hands thoroughly with soap and water. 2. You may remove the patch earlier than 72 hours if you experience unpleasant side effects which may include dry mouth, dizziness or visual disturbances. 3. Avoid touching the patch. Wash your hands with soap and water after contact with the patch.    

## 2019-06-15 NOTE — Op Note (Signed)
06/15/2019  2:44 PM  PATIENT:  Phillip Rios  69 y.o. male  Patient Care Team: Monico Blitz, MD as PCP - General (Internal Medicine) Harl Bowie Alphonse Guild, MD as PCP - Cardiology (Cardiology)  PRE-OPERATIVE DIAGNOSIS:  ANAL FISTULA  POST-OPERATIVE DIAGNOSIS:  ANAL FISTULA  PROCEDURE:   ANAL EXAM UNDER ANESTHESIA, LIGATION OF POSTERIOR INTERNAL FISTULA TRACT, ANTERIOR FISTULOTOMY   Surgeon(s): Leighton Ruff, MD  ASSISTANT: none   ANESTHESIA:   local, general  SPECIMEN:  No Specimen  DISPOSITION OF SPECIMEN:  N/A  COUNTS:  YES  PLAN OF CARE: Discharge to home after PACU  PATIENT DISPOSITION:  PACU - hemodynamically stable.  INDICATION: 69 y.o. M with multiple fistula undergoing staged procedure for closure   OR FINDINGS: Intersphincteric anterior fistula status post anterior LIFT.  Posterior transsphincteric fistula with multiple fistula tracts status post seton placement.  DESCRIPTION: the patient was identified in the preoperative holding area and taken to the OR where they were laid on the operating room table.  General anesthesia was induced without difficulty. The patient was then positioned in prone jackknife position with buttocks gently taped apart.  The patient was then prepped and draped in usual sterile fashion.  SCDs were noted to be in place prior to the initiation of anesthesia. A surgical timeout was performed indicating the correct patient, procedure, positioning and need for preoperative antibiotics.  A rectal block was performed using Marcaine with epinephrine mixed with Exparel.    I began with a digital rectal exam.  The patient sphincter tone was tight.  I gently dilated to approximately 2 fingerbreadths.  I then placed a Hill-Ferguson anoscope into the anal canal and evaluated this completely.  Patient had a small anterior intersphincteric fistula after his previous LIF T procedure.  This involved approximately 3 mm of internal sphincter.  A fistulotomy  was performed.  The edges were marsupialized using interrupted 3-0 Vicryl sutures.  I then turned my attention to the post anterior fistula tract.  I placed a fistula probe through the previous fistula tract and remove the seton.  I made an incision into the intersphincteric groove using a 15 blade scalpel.  Dissection was carried down into the intersphincteric groove using blunt dissection on either side of the fistula tract.  I dissected bluntly underneath of the fistula tract with a right angle clamp.  I then placed 2, 2-0 silk sutures around the fistula tract.  The fistula probe was removed.  The fistula tract was divided after the sutures were tied tightly.  I reinforced each suture with another figure-of-eight 2-0 silk suture.  I then closed down to other fistula tracts lateral to the main fistula tract using figure-of-eight 2-0 silk sutures.  I then injected hydrogen peroxide into the external opening.  There was no leak of peroxide into the wound indicating watertight closure.  I then closed the intersphincteric groove using interrupted 3-0 Vicryl sutures.  I then placed two 3-0 Vicryl sutures on the fistula tract inside the anal canal.  I closed the incision site using interrupted 2-0 chromic sutures.  I then enlarged the external opening using electrocautery.  Lidocaine ointment and a sterile dressing was applied.  Hemostasis was good.  The patient was awakened from anesthesia and sent to the postanesthesia care unit in stable condition.  All counts were correct per operating room staff.

## 2019-06-15 NOTE — Anesthesia Postprocedure Evaluation (Signed)
Anesthesia Post Note  Patient: Phillip Rios  Procedure(s) Performed: ANAL EXAM UNDER ANESTHESIA, LIGATION OF INTERNAL FISTULA TRACT, FISTULOTOMY (N/A Rectum)     Patient location during evaluation: PACU Anesthesia Type: General Level of consciousness: awake and alert Pain management: pain level controlled Vital Signs Assessment: post-procedure vital signs reviewed and stable Respiratory status: spontaneous breathing, nonlabored ventilation and respiratory function stable Cardiovascular status: blood pressure returned to baseline and stable Postop Assessment: no apparent nausea or vomiting Anesthetic complications: no    Last Vitals:  Vitals:   06/15/19 1530 06/15/19 1545  BP: (!) 168/107 (!) 169/114  Pulse: 66 65  Resp: 14 12  Temp:    SpO2: 95% 95%    Last Pain:  Vitals:   06/15/19 1530  TempSrc:   PainSc: 0-No pain                 Lidia Collum

## 2019-06-15 NOTE — Interval H&P Note (Signed)
History and Physical Interval Note:  06/15/2019 1:24 PM  Phillip Rios  has presented today for surgery, with the diagnosis of ANAL FISTULA.  The various methods of treatment have been discussed with the patient and family. After consideration of risks, benefits and other options for treatment, the patient has consented to  Procedure(s): ANAL EXAM UNDER ANESTHESIA, LIGATION OF INTERNAL FISTULA TRACT, POSSIBLE FISTULOTOMY (N/A) as a surgical intervention.  The patient's history has been reviewed, patient examined, no change in status, stable for surgery.  I have reviewed the patient's chart and labs.  Questions were answered to the patient's satisfaction.     Rosario Adie

## 2019-07-17 DIAGNOSIS — I2510 Atherosclerotic heart disease of native coronary artery without angina pectoris: Secondary | ICD-10-CM | POA: Diagnosis not present

## 2019-07-17 DIAGNOSIS — G4733 Obstructive sleep apnea (adult) (pediatric): Secondary | ICD-10-CM | POA: Diagnosis not present

## 2019-07-17 DIAGNOSIS — Z299 Encounter for prophylactic measures, unspecified: Secondary | ICD-10-CM | POA: Diagnosis not present

## 2019-07-17 DIAGNOSIS — I10 Essential (primary) hypertension: Secondary | ICD-10-CM | POA: Diagnosis not present

## 2019-07-17 DIAGNOSIS — Z6841 Body Mass Index (BMI) 40.0 and over, adult: Secondary | ICD-10-CM | POA: Diagnosis not present

## 2019-07-17 DIAGNOSIS — C61 Malignant neoplasm of prostate: Secondary | ICD-10-CM | POA: Diagnosis not present

## 2019-08-19 IMAGING — NM NM BONE WHOLE BODY
4 series · 4 of 4 positions shown · non-contrast
Comparison: CTA of the chest, abdomen, and pelvis 07/07/2015

CLINICAL DATA: Recent diagnosis of prostate cancer.

EXAM:
NUCLEAR MEDICINE WHOLE BODY BONE SCAN
TECHNIQUE: Whole body anterior and posterior images were obtained approximately
3 hours after intravenous injection of radiopharmaceutical.
RADIOPHARMACEUTICALS:  And 2.95 mCi Nechnetium-LLm MDP IV

[Series 1: wbr_bone_60 whole body · 2.66mm/px · 1 of 1 slices shown (1 of 2)]
[im 1/1]
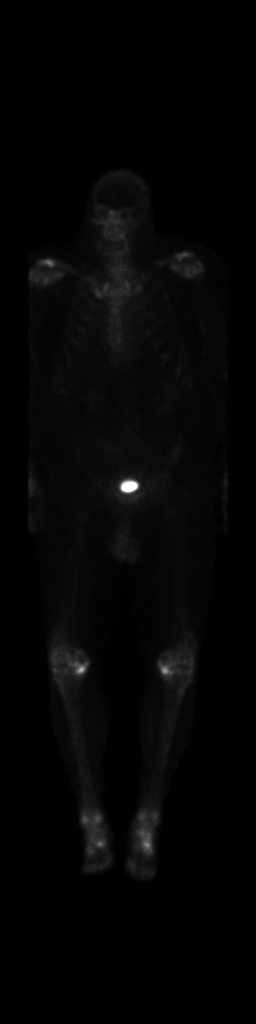

[Series 1: whole body · 2.66mm/px · 1 of 1 slices shown (1 of 2)]
[im 1/1]
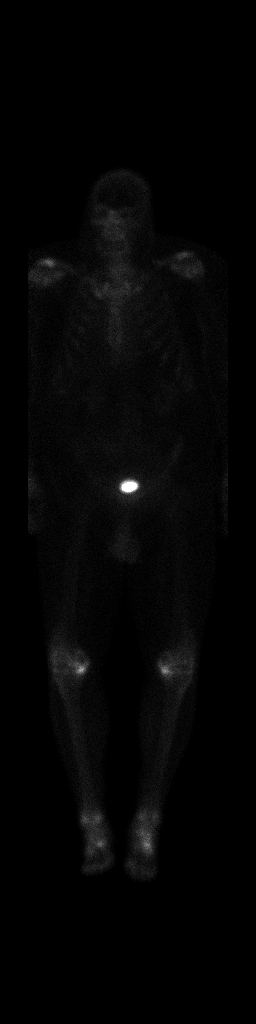

[Series 1: whole body · 2.66mm/px · 1 of 1 slices shown (2 of 2)]
[im 1/1]
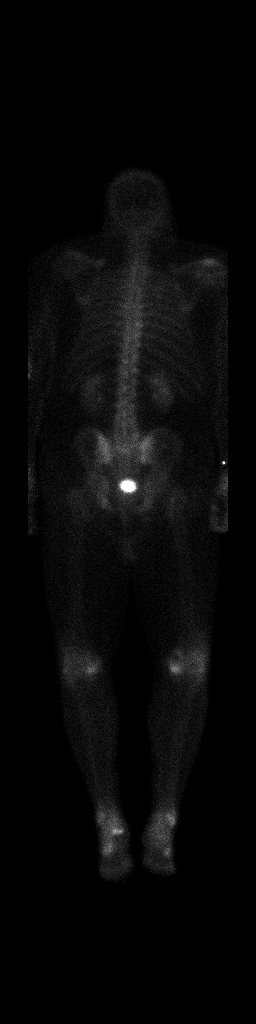

[Series 1: wbr_bone_60 whole body · 2.66mm/px · 1 of 1 slices shown (2 of 2)]
[im 1/1]
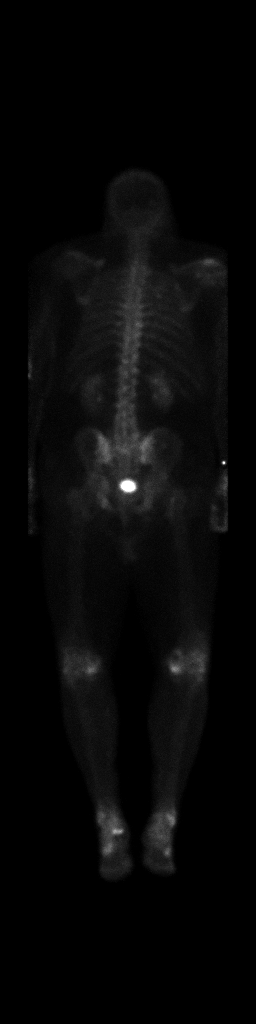

[4 of 4 positions shown; findings below may reference images not displayed]

FINDINGS: Degenerate changes are noted in the medial knees bilaterally.
Lateral ankle degenerate changes are present. Uptake in the right
there is uptake in the feet bilaterally, likely related to
degenerative change rather than fracture. Focal uptake is present at
the right costophrenic junction at T8 or T9. Degenerate changes are
noted in the shoulders bilaterally. No other uptake is present to
suggest metastatic disease.
IMPRESSION: 1. Diffuse degenerative changes.
2. No evidence for metastatic disease.

## 2019-09-18 DIAGNOSIS — Z299 Encounter for prophylactic measures, unspecified: Secondary | ICD-10-CM | POA: Diagnosis not present

## 2019-09-18 DIAGNOSIS — I10 Essential (primary) hypertension: Secondary | ICD-10-CM | POA: Diagnosis not present

## 2019-09-18 DIAGNOSIS — M25569 Pain in unspecified knee: Secondary | ICD-10-CM | POA: Diagnosis not present

## 2019-09-18 DIAGNOSIS — Z6841 Body Mass Index (BMI) 40.0 and over, adult: Secondary | ICD-10-CM | POA: Diagnosis not present

## 2019-09-18 DIAGNOSIS — L711 Rhinophyma: Secondary | ICD-10-CM | POA: Diagnosis not present

## 2019-09-18 DIAGNOSIS — G4733 Obstructive sleep apnea (adult) (pediatric): Secondary | ICD-10-CM | POA: Diagnosis not present

## 2019-09-25 ENCOUNTER — Other Ambulatory Visit: Payer: Self-pay

## 2019-09-25 MED ORDER — ATORVASTATIN CALCIUM 80 MG PO TABS: 80.0000 mg | ORAL_TABLET | Freq: Every day | ORAL | 3 refills | Status: DC

## 2019-10-31 DIAGNOSIS — L711 Rhinophyma: Secondary | ICD-10-CM | POA: Diagnosis not present

## 2019-11-12 DIAGNOSIS — I10 Essential (primary) hypertension: Secondary | ICD-10-CM | POA: Diagnosis not present

## 2019-11-12 DIAGNOSIS — R059 Cough, unspecified: Secondary | ICD-10-CM | POA: Diagnosis not present

## 2019-11-12 DIAGNOSIS — Z299 Encounter for prophylactic measures, unspecified: Secondary | ICD-10-CM | POA: Diagnosis not present

## 2019-11-12 DIAGNOSIS — R0602 Shortness of breath: Secondary | ICD-10-CM | POA: Diagnosis not present

## 2019-11-12 DIAGNOSIS — Z789 Other specified health status: Secondary | ICD-10-CM | POA: Diagnosis not present

## 2019-11-12 DIAGNOSIS — R091 Pleurisy: Secondary | ICD-10-CM | POA: Diagnosis not present

## 2019-11-12 DIAGNOSIS — Z23 Encounter for immunization: Secondary | ICD-10-CM | POA: Diagnosis not present

## 2019-11-12 DIAGNOSIS — R079 Chest pain, unspecified: Secondary | ICD-10-CM | POA: Diagnosis not present

## 2019-11-28 DIAGNOSIS — L711 Rhinophyma: Secondary | ICD-10-CM | POA: Diagnosis not present

## 2019-11-28 DIAGNOSIS — G4733 Obstructive sleep apnea (adult) (pediatric): Secondary | ICD-10-CM | POA: Diagnosis not present

## 2019-11-28 DIAGNOSIS — Z7982 Long term (current) use of aspirin: Secondary | ICD-10-CM | POA: Diagnosis not present

## 2019-12-20 DIAGNOSIS — Z1331 Encounter for screening for depression: Secondary | ICD-10-CM | POA: Diagnosis not present

## 2019-12-20 DIAGNOSIS — Z0000 Encounter for general adult medical examination without abnormal findings: Secondary | ICD-10-CM | POA: Diagnosis not present

## 2019-12-20 DIAGNOSIS — Z7189 Other specified counseling: Secondary | ICD-10-CM | POA: Diagnosis not present

## 2019-12-20 DIAGNOSIS — Z79899 Other long term (current) drug therapy: Secondary | ICD-10-CM | POA: Diagnosis not present

## 2019-12-20 DIAGNOSIS — E7800 Pure hypercholesterolemia, unspecified: Secondary | ICD-10-CM | POA: Diagnosis not present

## 2019-12-20 DIAGNOSIS — Z125 Encounter for screening for malignant neoplasm of prostate: Secondary | ICD-10-CM | POA: Diagnosis not present

## 2019-12-20 DIAGNOSIS — Z299 Encounter for prophylactic measures, unspecified: Secondary | ICD-10-CM | POA: Diagnosis not present

## 2019-12-20 DIAGNOSIS — Z1339 Encounter for screening examination for other mental health and behavioral disorders: Secondary | ICD-10-CM | POA: Diagnosis not present

## 2019-12-20 DIAGNOSIS — R5383 Other fatigue: Secondary | ICD-10-CM | POA: Diagnosis not present

## 2019-12-20 DIAGNOSIS — Z6841 Body Mass Index (BMI) 40.0 and over, adult: Secondary | ICD-10-CM | POA: Diagnosis not present

## 2020-03-20 DIAGNOSIS — M542 Cervicalgia: Secondary | ICD-10-CM | POA: Diagnosis not present

## 2020-03-20 DIAGNOSIS — I10 Essential (primary) hypertension: Secondary | ICD-10-CM | POA: Diagnosis not present

## 2020-03-20 DIAGNOSIS — R42 Dizziness and giddiness: Secondary | ICD-10-CM | POA: Diagnosis not present

## 2020-03-20 DIAGNOSIS — Z789 Other specified health status: Secondary | ICD-10-CM | POA: Diagnosis not present

## 2020-03-20 DIAGNOSIS — Z6841 Body Mass Index (BMI) 40.0 and over, adult: Secondary | ICD-10-CM | POA: Diagnosis not present

## 2020-03-20 DIAGNOSIS — M25569 Pain in unspecified knee: Secondary | ICD-10-CM | POA: Diagnosis not present

## 2020-03-20 DIAGNOSIS — C61 Malignant neoplasm of prostate: Secondary | ICD-10-CM | POA: Diagnosis not present

## 2020-03-26 ENCOUNTER — Ambulatory Visit: Payer: Medicare HMO | Admitting: Cardiology

## 2020-03-26 VITALS — BP 124/78 | HR 106 | Ht 73.0 in | Wt 360.2 lb

## 2020-03-26 DIAGNOSIS — I2510 Atherosclerotic heart disease of native coronary artery without angina pectoris: Secondary | ICD-10-CM

## 2020-03-26 DIAGNOSIS — R0602 Shortness of breath: Secondary | ICD-10-CM

## 2020-03-26 DIAGNOSIS — R42 Dizziness and giddiness: Secondary | ICD-10-CM

## 2020-03-26 NOTE — Progress Notes (Signed)
Clinical Summary Mr. Paden is a 70 y.o.male seen today for follow up of the following medical problems.   1. Chest pain/CAD - seen 06/2017 in ER with chest pain - noncardiac in description. Constant for 7 days, worst with palpation - CT PE was negative. Trop neg x 2. EKG without acute ischemic changes  - 08/2017 nuclear stress orderded by pcp completed at [facility]: severe areas of reversibility in the apex, lateral wall, and inferior wall all comletely reversible. -08/2017 cath with 80% prox LAD lesion, s/p DES.   Sharp pain at times, left sided, 5/10 in severity. Lasts few seconds. Not positinal, not exertional      2. Dizziness - ongonig for a few months - mainly occurs with standing - 2-3 large cups of water daily. Occasional coffee. No GI losses - occasional palpitations - did fall from dizziness once.   3. SOB - worsening SOB.  - DOE with short distances - no recent edema. - no coughing or wheezing.  - 30 lbs weight gain over the last 1.5 years          SH: works as Engineer, building services.Currently out of work due to severe knee pain, reports he needs a replacement.   Past Medical History:  Diagnosis Date  . Anal fistula   . Arthritis    both knees  . Atrial fibrillation ([place]) 2018   short episode of afib, noted on event monitor  . Coronary artery disease    non obstructive  . Dyslipidemia   . GERD (gastroesophageal reflux disease)   . Hypertension   . Morbid obesity ([place])   . OSA on CPAP    does not use cpap, could not get cpap  . Prostate cancer ([place])    Stage IIC , prostate completed radiation 06/08/17  . PSVT (paroxysmal supraventricular tachycardia) ([place]) 2018    noted on event monitor     No Known Allergies   Current Outpatient Medications  Medication Sig Dispense Refill  . acetaminophen (TYLENOL) 500 MG tablet Take 500 mg by mouth every 6 (six) hours as needed for moderate pain or headache.    Marland Kitchen amLODipine (NORVASC) 5 MG  tablet Take 5 mg by mouth daily.    Marland Kitchen aspirin EC 81 MG tablet Take 81 mg by mouth daily.    Marland Kitchen atorvastatin (LIPITOR) 80 MG tablet Take 1 tablet (80 mg total) by mouth daily at 6 PM. 90 tablet 3  . docusate sodium (COLACE) 100 MG capsule Take 200 mg by mouth daily as needed.     . hydrochlorothiazide (HYDRODIURIL) 25 MG tablet Take 25 mg by mouth daily.    Marland Kitchen lisinopril (PRINIVIL,ZESTRIL) 20 MG tablet Take 40 mg by mouth daily.    . metoprolol succinate (TOPROL-XL) 50 MG 24 hr tablet Take 25 mg by mouth daily.    . nitroGLYCERIN (NITROSTAT) 0.4 MG SL tablet Place 1 tablet (0.4 mg total) under the tongue every 5 (five) minutes as needed for chest pain. 25 tablet 3  . oxyCODONE (OXY IR/ROXICODONE) 5 MG immediate release tablet Take 1-2 tablets (5-10 mg total) by mouth every 6 (six) hours as needed. 30 tablet 0  . pantoprazole (PROTONIX) 40 MG tablet Take 40 mg by mouth daily.  2  . tamsulosin (FLOMAX) 0.4 MG CAPS capsule Take 0.4 mg by mouth at bedtime.      No current facility-administered medications for this visit.     Past Surgical History:  Procedure Laterality Date  . ABDOMINAL  SURGERY     Stomach surgery for obstruction as a child  . CARDIAC CATHETERIZATION     cath  . COLONOSCOPY    . COLONOSCOPY N/A 12/21/2018   Procedure: COLONOSCOPY;  Surgeon: Rogene Houston, MD;  [facility];  Service: Endoscopy;  Laterality: N/A;  100  . CORONARY STENT INTERVENTION  09/01/2017  . CORONARY STENT INTERVENTION N/A 09/01/2017   Procedure: CORONARY STENT INTERVENTION;  Surgeon: Burnell Blanks, MD;  Location: [place] [facility];  Service: Cardiovascular;  Laterality: N/A;  . EVALUATION UNDER ANESTHESIA WITH ANAL FISSUROTOMY N/A 06/15/2019   Procedure: ANAL EXAM UNDER ANESTHESIA, LIGATION OF INTERNAL FISTULA TRACT, FISTULOTOMY;  Surgeon: Leighton Ruff, MD;  Location: [facility];  Service: General;  Laterality: N/A;  . INTRAVASCULAR PRESSURE WIRE/FFR STUDY N/A  09/01/2017   Procedure: INTRAVASCULAR PRESSURE WIRE/FFR STUDY;  Surgeon: Burnell Blanks, MD;  Location: [place] [facility];  Service: Cardiovascular;  Laterality: N/A;  . LEFT HEART CATH AND CORONARY ANGIOGRAPHY N/A 09/01/2017   Procedure: LEFT HEART CATH AND CORONARY ANGIOGRAPHY;  Surgeon: Burnell Blanks, MD;  Location: [place] [facility];  Service: Cardiovascular;  Laterality: N/A;  . LIGATION OF INTERNAL FISTULA TRACT N/A 03/29/2019   Procedure: LIGATION OF ANTERIOR FISTULA TRACT;  Surgeon: Leighton Ruff, MD;  Location: [facility];  Service: General;  Laterality: N/A;  . PLACEMENT OF SETON  11/08/2018   Procedure: PLACEMENT OF SETON X 4 IN TRANSPHINCTERIC FISTULA;  Surgeon: Virl Cagey, MD;  [facility];  Service: General;;     No Known Allergies    Family History  Problem Relation Age of Onset  . Heart failure Father   . Heart disease Father      Social History Mr. Oriley reports that he has never smoked. His smokeless tobacco use includes chew. Mr. Borum reports no history of alcohol use.   Review of Systems CONSTITUTIONAL: No weight loss, fever, chills, weakness or fatigue.  HEENT: Eyes: No visual loss, blurred vision, double vision or yellow sclerae.No hearing loss, sneezing, congestion, runny nose or sore throat.  SKIN: No rash or itching.  CARDIOVASCULAR: per hpi RESPIRATORY: No shortness of breath, cough or sputum.  GASTROINTESTINAL: No anorexia, nausea, vomiting or diarrhea. No abdominal pain or blood.  GENITOURINARY: No burning on urination, no polyuria NEUROLOGICAL: No headache, dizziness, syncope, paralysis, ataxia, numbness or tingling in the extremities. No change in bowel or bladder control.  MUSCULOSKELETAL: No muscle, back pain, joint pain or stiffness.  LYMPHATICS: No enlarged nodes. No history of splenectomy.  PSYCHIATRIC: No history of depression or anxiety.  ENDOCRINOLOGIC: No reports of sweating, cold or heat  intolerance. No polyuria or polydipsia.  Marland Kitchen   Physical Examination Today's Vitals   03/26/20 1132 03/26/20 1133 03/26/20 1134 03/26/20 1136  BP: 110/77 134/85 118/82 124/78  Pulse: 99 (!) 105 (!) 112 (!) 106  SpO2:      Weight:      Height:       Body mass index is 47.52 kg/m.  Gen: resting comfortably, no acute distress HEENT: no scleral icterus, pupils equal round and reactive, no palptable cervical adenopathy,  CV: RRR, no mr/g, no jvd Resp: Clear to auscultation bilaterally GI: abdomen is soft, non-tender, non-distended, normal bowel sounds, no hepatosplenomegaly MSK: extremities are warm, no edema.  Skin: warm, no rash Neuro:  no focal deficits Psych: appropriate affect   Diagnostic Studies 08/2017 cath  Prox LAD lesion is 80% stenosed.  A drug-eluting  stent was successfully placed using a STENT SIERRA 2.75 X 12 MM.  Post intervention, there is a 0% residual stenosis.  The left ventricular systolic function is normal.  LV end diastolic pressure is normal.  The left ventricular ejection fraction is 55-65% by visual estimate.  There is no mitral valve regurgitation.  1. Severe stenosis mid LAD. The stenosis was eccentric and in some views did not appear to be severe. FFR 0.8 in the mid LAD suggesting the stenosis was likely flow limiting. Given the abnormal FFR and the abnormal nuclear stress test, PCI was performed.  2. Successful PTCA/DES x 1 mid LAD 3. Normal LV systolic function  Post cathRecommendations: Will start a statin.  Recommend uninterrupted dual antiplatelet therapy with Aspirin 81mg  daily and Clopidogrel 75mg  dailyfor a minimum of 12 months (ACS - Class I recommendation).  06/2016 echo Study Conclusions  - Procedure narrative: Transthoracic echocardiography. Image quality was suboptimal with poor valvular and endocardial visualization in several views. - Left ventricle: The cavity size was normal. Wall thickness was increased in a  pattern of mild LVH. Systolic function was vigorous. The estimated ejection fraction was in the range of 65% to 70%. Images were inadequate for LV wall motion assessment. Doppler parameters are consistent with abnormal left ventricular relaxation (grade 1 diastolic dysfunction). - Aorta: Mild aortic root enlargement.    Assessment and Plan  1.CAD - atypical chest pains - some recent DOE, we will obtain an echo  2.Dizziness - by description symptoms are primarily orthostatic as they occur with standing. - orthostatics today are negative - reports adequate hydration - will d/c his HCTZ and monitor symptoms - if ongoing could consider 1 week zio patch at f/u - EKG today shows mild sinus tach  3. SOB - obtain echo - 30 lbs weight gain over the last 1.5 years, may be etiology. Risk for restrictive lung disease as well.  - f/u echo results.       Arnoldo Lenis, M.D.

## 2020-03-26 NOTE — Patient Instructions (Addendum)
Your physician recommends that you schedule a follow-up appointment in: [place], NP  Your physician has recommended you make the following change in your medication:   STOP HCTZ  Your physician has requested that you have an echocardiogram. Echocardiography is a painless test that uses sound waves to create images of your heart. It provides your doctor with information about the size and shape of your heart and how well your heart's chambers and valves are working. This procedure takes approximately one hour. There are no restrictions for this procedure.  Thank you for choosing [facility]!!

## 2020-03-27 ENCOUNTER — Ambulatory Visit (INDEPENDENT_AMBULATORY_CARE_PROVIDER_SITE_OTHER): Payer: Medicare HMO

## 2020-03-27 DIAGNOSIS — R0602 Shortness of breath: Secondary | ICD-10-CM | POA: Diagnosis not present

## 2020-03-27 LAB — ECHOCARDIOGRAM COMPLETE: S' Lateral: 3.36 cm

## 2020-04-01 ENCOUNTER — Telehealth: Payer: Self-pay | Admitting: *Deleted

## 2020-04-01 NOTE — Telephone Encounter (Signed)
-----   Message from Arnoldo Lenis, MD sent at 03/31/2020  9:19 AM EDT ----- Echo looks good. Normal heart pumping function  Zandra Abts MD

## 2020-04-01 NOTE — Telephone Encounter (Signed)
Pt aware.

## 2020-04-21 DIAGNOSIS — Z6841 Body Mass Index (BMI) 40.0 and over, adult: Secondary | ICD-10-CM | POA: Diagnosis not present

## 2020-04-21 DIAGNOSIS — I2510 Atherosclerotic heart disease of native coronary artery without angina pectoris: Secondary | ICD-10-CM | POA: Diagnosis not present

## 2020-04-21 DIAGNOSIS — R42 Dizziness and giddiness: Secondary | ICD-10-CM | POA: Diagnosis not present

## 2020-04-21 DIAGNOSIS — Z299 Encounter for prophylactic measures, unspecified: Secondary | ICD-10-CM | POA: Diagnosis not present

## 2020-04-21 DIAGNOSIS — I10 Essential (primary) hypertension: Secondary | ICD-10-CM | POA: Diagnosis not present

## 2020-04-21 DIAGNOSIS — M1710 Unilateral primary osteoarthritis, unspecified knee: Secondary | ICD-10-CM | POA: Diagnosis not present

## 2020-05-07 NOTE — Progress Notes (Signed)
Cardiology Office Note  Date: 05/08/2020   ID: Phillip, Rios 1950-06-04, MRN [id number]  PCP:  Phillip Blitz, MD  Cardiologist:  Phillip Dolly, MD Electrophysiologist:  None   Chief Complaint: Follow-up chest pain, DOE, orthostatic symptoms.  History of Present Illness: Phillip Rios is a 70 y.o. male with a history of CAD, atrial fibrillation, HTN, HLD, OSA on CPAP, Morbid obesity, PSVT.   Last seen by Dr. Harl Bowie 03/26/2020.  Experiencing atypical chest pain with recent DOE.  Echocardiogram was ordered.   Some orthostatic symptoms with standing.  Orthostatics on that visit were negative.  HCTZ was discontinued.  If ongoing would consider 1 week Zio patch at follow-up.  EKG on that visit showed mild sinus tach.  30 pound weight gain over the last 1.5 years suggested may be etiology of shortness of breath.  At risk for restrictive lung disease as well.  He is here for follow-up today.  He states he is continuing to have back pain and left side pain when taking deep breaths.  States the pain now is located between his shoulder blades.  He is concerned this may be pleurisy.  States he has had pleurisy in the past.  He denies any orthostatic symptoms today.  He states it has resolved.  Pain between shoulder blades in the left side is aggravated by movement.  He states he can barely raise up from a chair using both arms without having back pain.  Otherwise he denies any anginal or exertional symptoms.  We discussed recent echocardiogram results demonstrating EF of 60 to 65%.  Unable to evaluate WMA's, mild LVH.  LV diastolic parameters indeterminate.  Previous echo demonstrated G1 DD.  No current complaints of DOE or orthostatic symptoms.  It appears he has lost 3 pounds since last visit.  He states it is hard to exercise and lose weight due to bad knees.   Past Medical History:  Diagnosis Date  . Anal fistula   . Arthritis    both knees  . Atrial fibrillation ([place]) 2018   short  episode of afib, noted on event monitor  . Coronary artery disease    non obstructive  . Dyslipidemia   . GERD (gastroesophageal reflux disease)   . Hypertension   . Morbid obesity ([place])   . OSA on CPAP    does not use cpap, could not get cpap  . Prostate cancer ([place])    Stage IIC , prostate completed radiation 06/08/17  . PSVT (paroxysmal supraventricular tachycardia) ([place]) 2018    noted on event monitor    Past Surgical History:  Procedure Laterality Date  . ABDOMINAL SURGERY     Stomach surgery for obstruction as a child  . CARDIAC CATHETERIZATION     cath  . COLONOSCOPY    . COLONOSCOPY N/A 12/21/2018   Procedure: COLONOSCOPY;  Surgeon: Rogene Houston, MD;  [facility];  Service: Endoscopy;  Laterality: N/A;  100  . CORONARY STENT INTERVENTION  09/01/2017  . CORONARY STENT INTERVENTION N/A 09/01/2017   Procedure: CORONARY STENT INTERVENTION;  Surgeon: Burnell Blanks, MD;  Location: [place] [facility];  Service: Cardiovascular;  Laterality: N/A;  . EVALUATION UNDER ANESTHESIA WITH ANAL FISSUROTOMY N/A 06/15/2019   Procedure: ANAL EXAM UNDER ANESTHESIA, LIGATION OF INTERNAL FISTULA TRACT, FISTULOTOMY;  Surgeon: Leighton Ruff, MD;  Location: [facility];  Service: General;  Laterality: N/A;  . INTRAVASCULAR PRESSURE WIRE/FFR STUDY N/A 09/01/2017   Procedure: INTRAVASCULAR PRESSURE  WIRE/FFR STUDY;  Surgeon: Burnell Blanks, MD;  Location: [place] [facility];  Service: Cardiovascular;  Laterality: N/A;  . LEFT HEART CATH AND CORONARY ANGIOGRAPHY N/A 09/01/2017   Procedure: LEFT HEART CATH AND CORONARY ANGIOGRAPHY;  Surgeon: Burnell Blanks, MD;  Location: [place] [facility];  Service: Cardiovascular;  Laterality: N/A;  . LIGATION OF INTERNAL FISTULA TRACT N/A 03/29/2019   Procedure: LIGATION OF ANTERIOR FISTULA TRACT;  Surgeon: Leighton Ruff, MD;  Location: [facility];  Service: General;  Laterality: N/A;  . PLACEMENT  OF SETON  11/08/2018   Procedure: PLACEMENT OF SETON X 4 IN TRANSPHINCTERIC FISTULA;  Surgeon: Virl Cagey, MD;  Location: [facility];  Service: General;;    Current Outpatient Medications  Medication Sig Dispense Refill  . acetaminophen (TYLENOL) 500 MG tablet Take 500 mg by mouth every 6 (six) hours as needed for moderate pain or headache.    Marland Kitchen amLODipine (NORVASC) 5 MG tablet Take 5 mg by mouth daily.    Marland Kitchen aspirin EC 81 MG tablet Take 81 mg by mouth daily.    Marland Kitchen atorvastatin (LIPITOR) 80 MG tablet Take 1 tablet (80 mg total) by mouth daily at 6 PM. 90 tablet 3  . docusate sodium (COLACE) 100 MG capsule Take 200 mg by mouth daily as needed.     Marland Kitchen lisinopril (PRINIVIL,ZESTRIL) 20 MG tablet Take 40 mg by mouth daily.    . metoprolol succinate (TOPROL-XL) 50 MG 24 hr tablet Take 25 mg by mouth daily.    . nitroGLYCERIN (NITROSTAT) 0.4 MG SL tablet Place 1 tablet (0.4 mg total) under the tongue every 5 (five) minutes as needed for chest pain. 25 tablet 3  . pantoprazole (PROTONIX) 40 MG tablet Take 40 mg by mouth daily.  2   No current facility-administered medications for this visit.   Allergies:  Patient has no known allergies.   Social History: The patient  reports that he has never smoked. His smokeless tobacco use includes chew. He reports that he does not drink alcohol and does not use drugs.   Family History: The patient's family history includes Heart disease in his father; Heart failure in his father.   ROS:  Please see the history of present illness. Otherwise, complete review of systems is positive for none.  All other systems are reviewed and negative.   Physical Exam: VS:  BP 136/88   Pulse 87   Ht 6\' 1"  (1.854 m)   Wt (!) 357 lb 9.6 oz (162.2 kg)   SpO2 95%   BMI 47.18 kg/m , BMI Body mass index is 47.18 kg/m.  Wt Readings from Last 3 Encounters:  05/08/20 (!) 357 lb 9.6 oz (162.2 kg)  03/26/20 (!) 360 lb 3.2 oz (163.4 kg)  06/15/19 (!) 337 lb 8 oz (153.1 kg)     General: Morbidly obese patient appears comfortable at rest. Neck: Supple, no elevated JVP or carotid bruits, no thyromegaly. Lungs: Clear to auscultation, nonlabored breathing at rest. Cardiac: Regular rate and rhythm, no S3 or significant systolic murmur, no pericardial rub. Extremities: No pitting edema, distal pulses 2+. Skin: Warm and dry. Musculoskeletal: No kyphosis. Neuropsychiatric: Alert and oriented x3, affect grossly appropriate.  ECG:    Recent Labwork: 06/15/2019: BUN 29; Creatinine, Ser 1.70; Hemoglobin 14.3; Potassium 4.1; Sodium 139     Component Value Date/Time   CHOL 156 09/02/2017 0440   TRIG 265 (H) 09/02/2017 0440   HDL 28 (L) 09/02/2017 0440   CHOLHDL 5.6  09/02/2017 0440   VLDL 53 (H) 09/02/2017 0440   LDLCALC 75 09/02/2017 0440    Other Studies Reviewed Today:  Echocardiogram 03/27/2020 IMPRESSIONS  1. Left ventricular ejection fraction, by estimation, is 60 to 65%. The  left ventricle has normal function. Left ventricular endocardial border  not optimally defined to evaluate regional wall motion. There is mild left  ventricular hypertrophy. Left  ventricular diastolic parameters are indeterminate.  2. Right ventricular systolic function is normal. The right ventricular  size is normal.  3. The mitral valve is normal in structure. No evidence of mitral valve  regurgitation. No evidence of mitral stenosis.  4. The aortic valve was not well visualized. Aortic valve regurgitation  is not visualized. No aortic stenosis is present.  5. Aortic dilatation noted. There is mild dilatation of the ascending  aorta, measuring 40 mm.   Comparison(s): Previous Echo was suboptimal, LV EF estimated at 65-70%,  Grade I diastolic dysfunction.      08/2017 cath  Prox LAD lesion is 80% stenosed.  A drug-eluting stent was successfully placed using a STENT SIERRA 2.75 X 12 MM.  Post intervention, there is a 0% residual stenosis.  The left ventricular  systolic function is normal.  LV end diastolic pressure is normal.  The left ventricular ejection fraction is 55-65% by visual estimate.  There is no mitral valve regurgitation.  1. Severe stenosis mid LAD. The stenosis was eccentric and in some views did not appear to be severe. FFR 0.8 in the mid LAD suggesting the stenosis was likely flow limiting. Given the abnormal FFR and the abnormal nuclear stress test, PCI was performed.  2. Successful PTCA/DES x 1 mid LAD 3. Normal LV systolic function  Post cathRecommendations: Will start a statin.  Recommend uninterrupted dual antiplatelet therapy with Aspirin 81mg  daily and Clopidogrel 75mg  dailyfor a minimum of 12 months (ACS - Class I recommendation).  06/2016 echo Study Conclusions  - Procedure narrative: Transthoracic echocardiography. Image quality was suboptimal with poor valvular and endocardial visualization in several views. - Left ventricle: The cavity size was normal. Wall thickness was increased in a pattern of mild LVH. Systolic function was vigorous. The estimated ejection fraction was in the range of 65% to 70%. Images were inadequate for LV wall motion assessment. Doppler parameters are consistent with abnormal left ventricular relaxation (grade 1 diastolic dysfunction). - Aorta: Mild aortic root enlargement.   Assessment and Plan:  1. CAD in native artery   2. Dizziness   3. SOB (shortness of breath)   4. Midline thoracic back pain, unspecified chronicity    1. CAD in native artery Denies any anginal symptoms.  Continue aspirin 81 mg daily.  Atorvastatin 80 mg daily.  Sublingual nitroglycerin as needed.    2. Dizziness Denies any current dizziness or orthostatic symptoms.  We will continue to monitor  3. SOB (shortness of breath) No significant shortness of breath.  Patient attributes some of the shortness of breath due to being overweight.  Recent echocardiogram demonstrated EF of 60 to  65%.  Unable to evaluate WMA's, mild LVH.  LV diastolic parameters indeterminate.  Previous echo demonstrated G1 DD.  4. Midline thoracic back pain, unspecified chronicity Complaining of thoracic back pain and left thoracic pain when taking deep breaths.  Also aggravated by movement.  Patient is concerned he may have a recurrence of pleurisy which he has had in the past.  Thoracic back midline is painful to deep palpation.  Most likely musculoskeletal in origin.  Please  get a PA and lateral chest x-ray.  Start baclofen 5 mg p.o. 3 times daily x5 days for muscle spasms.  5.  Essential hypertension Blood pressure slightly elevated today at 136/88.  Continue amlodipine 5 mg daily.  Continue lisinopril 20 mg daily.  Continue Toprol-XL 25 mg daily.  Continue to monitor  Medication Adjustments/Labs and Tests Ordered: Current medicines are reviewed at length with the patient today.  Concerns regarding medicines are outlined above.   Disposition: Follow-up with Dr. Harl Bowie or APP 6 months. Signed, Levell July, NP 05/08/2020 11:29 AM    [facility], [place], [place] [address] Phone: [phone]; Fax: [phone]

## 2020-05-08 ENCOUNTER — Ambulatory Visit (HOSPITAL_COMMUNITY)
Admission: RE | Admit: 2020-05-08 | Discharge: 2020-05-08 | Disposition: A | Discharge status: Home / Self Care | Payer: Medicare HMO | Source: Ambulatory Visit | Attending: Family Medicine | Admitting: Family Medicine

## 2020-05-08 ENCOUNTER — Encounter: Payer: Self-pay | Admitting: Family Medicine

## 2020-05-08 ENCOUNTER — Other Ambulatory Visit: Payer: Self-pay

## 2020-05-08 ENCOUNTER — Ambulatory Visit: Payer: Medicare HMO | Admitting: Family Medicine

## 2020-05-08 VITALS — BP 136/88 | HR 87 | Ht 73.0 in | Wt 357.6 lb

## 2020-05-08 DIAGNOSIS — M546 Pain in thoracic spine: Secondary | ICD-10-CM | POA: Insufficient documentation

## 2020-05-08 DIAGNOSIS — R079 Chest pain, unspecified: Secondary | ICD-10-CM | POA: Diagnosis not present

## 2020-05-08 DIAGNOSIS — R0602 Shortness of breath: Secondary | ICD-10-CM | POA: Insufficient documentation

## 2020-05-08 DIAGNOSIS — I2510 Atherosclerotic heart disease of native coronary artery without angina pectoris: Secondary | ICD-10-CM | POA: Diagnosis not present

## 2020-05-08 DIAGNOSIS — R42 Dizziness and giddiness: Secondary | ICD-10-CM | POA: Diagnosis not present

## 2020-05-08 MED ORDER — BACLOFEN 5 MG PO TABS: 1.0000 | ORAL_TABLET | Freq: Three times a day (TID) | ORAL | 0 refills | Status: DC

## 2020-05-08 NOTE — Patient Instructions (Signed)
Medication Instructions:   Begin Baclofen 5mg  three times per day x 5 days only.   Continue all current medications.  Labwork: none  Testing/Procedures:  A chest x-ray takes a picture of the organs and structures inside the chest, including the heart, lungs, and blood vessels. This test can show several things, including, whether the heart is enlarges; whether fluid is building up in the lungs; and whether pacemaker / defibrillator leads are still in place.  Office will contact with results via phone or letter.    Follow-Up: 6 months   Any Other Special Instructions Will Be Listed Below (If Applicable).  If you need a refill on your cardiac medications before your next appointment, please call your pharmacy.

## 2020-05-09 ENCOUNTER — Telehealth: Payer: Self-pay | Admitting: *Deleted

## 2020-05-09 NOTE — Telephone Encounter (Signed)
Laurine Blazer, LPN  2/56/3893 7:34 PM EDT Back to Top     Wife Vivien Rota) notified. Copy to pcp.    Laurine Blazer, LPN  2/87/6811 5:72 PM EDT      Left message to return call.   Verta Ellen., NP  05/09/2020 11:34 AM EDT      Please call the patient and let him know the CXR was normal.

## 2020-05-22 DIAGNOSIS — M1710 Unilateral primary osteoarthritis, unspecified knee: Secondary | ICD-10-CM | POA: Diagnosis not present

## 2020-05-22 DIAGNOSIS — I10 Essential (primary) hypertension: Secondary | ICD-10-CM | POA: Diagnosis not present

## 2020-05-22 DIAGNOSIS — Z299 Encounter for prophylactic measures, unspecified: Secondary | ICD-10-CM | POA: Diagnosis not present

## 2020-05-22 DIAGNOSIS — Z6841 Body Mass Index (BMI) 40.0 and over, adult: Secondary | ICD-10-CM | POA: Diagnosis not present

## 2020-06-12 DIAGNOSIS — M25569 Pain in unspecified knee: Secondary | ICD-10-CM | POA: Diagnosis not present

## 2020-06-12 DIAGNOSIS — Z713 Dietary counseling and surveillance: Secondary | ICD-10-CM | POA: Diagnosis not present

## 2020-06-12 DIAGNOSIS — Z6841 Body Mass Index (BMI) 40.0 and over, adult: Secondary | ICD-10-CM | POA: Diagnosis not present

## 2020-06-12 DIAGNOSIS — Z299 Encounter for prophylactic measures, unspecified: Secondary | ICD-10-CM | POA: Diagnosis not present

## 2020-06-12 DIAGNOSIS — I10 Essential (primary) hypertension: Secondary | ICD-10-CM | POA: Diagnosis not present

## 2020-09-15 DIAGNOSIS — Z299 Encounter for prophylactic measures, unspecified: Secondary | ICD-10-CM | POA: Diagnosis not present

## 2020-09-15 DIAGNOSIS — M1710 Unilateral primary osteoarthritis, unspecified knee: Secondary | ICD-10-CM | POA: Diagnosis not present

## 2020-09-15 DIAGNOSIS — Z6841 Body Mass Index (BMI) 40.0 and over, adult: Secondary | ICD-10-CM | POA: Diagnosis not present

## 2020-09-15 DIAGNOSIS — I10 Essential (primary) hypertension: Secondary | ICD-10-CM | POA: Diagnosis not present

## 2020-10-22 DIAGNOSIS — G8929 Other chronic pain: Secondary | ICD-10-CM | POA: Diagnosis not present

## 2020-10-22 DIAGNOSIS — D84822 Immunodeficiency due to external causes: Secondary | ICD-10-CM | POA: Diagnosis not present

## 2020-10-22 DIAGNOSIS — I4891 Unspecified atrial fibrillation: Secondary | ICD-10-CM | POA: Diagnosis not present

## 2020-10-22 DIAGNOSIS — C61 Malignant neoplasm of prostate: Secondary | ICD-10-CM | POA: Diagnosis not present

## 2020-10-22 DIAGNOSIS — I10 Essential (primary) hypertension: Secondary | ICD-10-CM | POA: Diagnosis not present

## 2020-10-22 DIAGNOSIS — Z6841 Body Mass Index (BMI) 40.0 and over, adult: Secondary | ICD-10-CM | POA: Diagnosis not present

## 2020-10-22 DIAGNOSIS — D6869 Other thrombophilia: Secondary | ICD-10-CM | POA: Diagnosis not present

## 2020-10-22 DIAGNOSIS — E785 Hyperlipidemia, unspecified: Secondary | ICD-10-CM | POA: Diagnosis not present

## 2020-10-22 DIAGNOSIS — I25119 Atherosclerotic heart disease of native coronary artery with unspecified angina pectoris: Secondary | ICD-10-CM | POA: Diagnosis not present

## 2020-11-17 ENCOUNTER — Other Ambulatory Visit: Payer: Self-pay

## 2020-11-17 MED ORDER — ATORVASTATIN CALCIUM 80 MG PO TABS: 80.0000 mg | ORAL_TABLET | Freq: Every day | ORAL | 3 refills | Status: AC

## 2020-11-20 DIAGNOSIS — I10 Essential (primary) hypertension: Secondary | ICD-10-CM | POA: Diagnosis not present

## 2020-11-20 DIAGNOSIS — Z713 Dietary counseling and surveillance: Secondary | ICD-10-CM | POA: Diagnosis not present

## 2020-11-20 DIAGNOSIS — Z299 Encounter for prophylactic measures, unspecified: Secondary | ICD-10-CM | POA: Diagnosis not present

## 2020-11-20 DIAGNOSIS — Z6841 Body Mass Index (BMI) 40.0 and over, adult: Secondary | ICD-10-CM | POA: Diagnosis not present

## 2020-11-20 DIAGNOSIS — J069 Acute upper respiratory infection, unspecified: Secondary | ICD-10-CM | POA: Diagnosis not present

## 2020-11-26 DIAGNOSIS — H5200 Hypermetropia, unspecified eye: Secondary | ICD-10-CM | POA: Diagnosis not present

## 2020-11-26 DIAGNOSIS — E7800 Pure hypercholesterolemia, unspecified: Secondary | ICD-10-CM | POA: Diagnosis not present

## 2020-11-26 DIAGNOSIS — Z0100 Encounter for examination of eyes and vision without abnormal findings: Secondary | ICD-10-CM | POA: Diagnosis not present

## 2020-12-22 DIAGNOSIS — Z0000 Encounter for general adult medical examination without abnormal findings: Secondary | ICD-10-CM | POA: Diagnosis not present

## 2020-12-22 DIAGNOSIS — R5383 Other fatigue: Secondary | ICD-10-CM | POA: Diagnosis not present

## 2020-12-22 DIAGNOSIS — Z299 Encounter for prophylactic measures, unspecified: Secondary | ICD-10-CM | POA: Diagnosis not present

## 2020-12-22 DIAGNOSIS — Z1331 Encounter for screening for depression: Secondary | ICD-10-CM | POA: Diagnosis not present

## 2020-12-22 DIAGNOSIS — Z6841 Body Mass Index (BMI) 40.0 and over, adult: Secondary | ICD-10-CM | POA: Diagnosis not present

## 2020-12-22 DIAGNOSIS — Z7189 Other specified counseling: Secondary | ICD-10-CM | POA: Diagnosis not present

## 2020-12-22 DIAGNOSIS — Z1339 Encounter for screening examination for other mental health and behavioral disorders: Secondary | ICD-10-CM | POA: Diagnosis not present

## 2020-12-22 DIAGNOSIS — C61 Malignant neoplasm of prostate: Secondary | ICD-10-CM | POA: Diagnosis not present

## 2020-12-22 DIAGNOSIS — Z79899 Other long term (current) drug therapy: Secondary | ICD-10-CM | POA: Diagnosis not present

## 2020-12-22 DIAGNOSIS — Z2821 Immunization not carried out because of patient refusal: Secondary | ICD-10-CM | POA: Diagnosis not present

## 2020-12-22 DIAGNOSIS — E7800 Pure hypercholesterolemia, unspecified: Secondary | ICD-10-CM | POA: Diagnosis not present

## 2021-01-26 DIAGNOSIS — C4442 Squamous cell carcinoma of skin of scalp and neck: Secondary | ICD-10-CM | POA: Diagnosis not present

## 2021-01-26 DIAGNOSIS — L989 Disorder of the skin and subcutaneous tissue, unspecified: Secondary | ICD-10-CM | POA: Diagnosis not present

## 2021-01-26 DIAGNOSIS — D485 Neoplasm of uncertain behavior of skin: Secondary | ICD-10-CM | POA: Diagnosis not present

## 2021-01-26 DIAGNOSIS — I25118 Atherosclerotic heart disease of native coronary artery with other forms of angina pectoris: Secondary | ICD-10-CM | POA: Diagnosis not present

## 2021-01-26 DIAGNOSIS — Z789 Other specified health status: Secondary | ICD-10-CM | POA: Diagnosis not present

## 2021-01-26 DIAGNOSIS — Z6841 Body Mass Index (BMI) 40.0 and over, adult: Secondary | ICD-10-CM | POA: Diagnosis not present

## 2021-01-26 DIAGNOSIS — I10 Essential (primary) hypertension: Secondary | ICD-10-CM | POA: Diagnosis not present

## 2021-01-26 DIAGNOSIS — Z299 Encounter for prophylactic measures, unspecified: Secondary | ICD-10-CM | POA: Diagnosis not present

## 2021-02-10 DIAGNOSIS — Z6841 Body Mass Index (BMI) 40.0 and over, adult: Secondary | ICD-10-CM | POA: Diagnosis not present

## 2021-02-10 DIAGNOSIS — C4492 Squamous cell carcinoma of skin, unspecified: Secondary | ICD-10-CM | POA: Diagnosis not present

## 2021-03-04 DIAGNOSIS — U071 COVID-19: Secondary | ICD-10-CM | POA: Diagnosis not present

## 2021-03-04 DIAGNOSIS — Z299 Encounter for prophylactic measures, unspecified: Secondary | ICD-10-CM | POA: Diagnosis not present

## 2021-03-04 DIAGNOSIS — I10 Essential (primary) hypertension: Secondary | ICD-10-CM | POA: Diagnosis not present

## 2021-03-23 DIAGNOSIS — Z6841 Body Mass Index (BMI) 40.0 and over, adult: Secondary | ICD-10-CM | POA: Diagnosis not present

## 2021-03-23 DIAGNOSIS — I10 Essential (primary) hypertension: Secondary | ICD-10-CM | POA: Diagnosis not present

## 2021-03-23 DIAGNOSIS — R0602 Shortness of breath: Secondary | ICD-10-CM | POA: Diagnosis not present

## 2021-03-23 DIAGNOSIS — I2510 Atherosclerotic heart disease of native coronary artery without angina pectoris: Secondary | ICD-10-CM | POA: Diagnosis not present

## 2021-03-23 DIAGNOSIS — R079 Chest pain, unspecified: Secondary | ICD-10-CM | POA: Diagnosis not present

## 2021-03-23 DIAGNOSIS — Z789 Other specified health status: Secondary | ICD-10-CM | POA: Diagnosis not present

## 2021-03-23 DIAGNOSIS — Z299 Encounter for prophylactic measures, unspecified: Secondary | ICD-10-CM | POA: Diagnosis not present

## 2021-03-26 DIAGNOSIS — L0292 Furuncle, unspecified: Secondary | ICD-10-CM | POA: Diagnosis not present

## 2021-03-26 DIAGNOSIS — C4449 Other specified malignant neoplasm of skin of scalp and neck: Secondary | ICD-10-CM | POA: Diagnosis not present

## 2021-03-30 DIAGNOSIS — I2510 Atherosclerotic heart disease of native coronary artery without angina pectoris: Secondary | ICD-10-CM | POA: Diagnosis not present

## 2021-04-28 DIAGNOSIS — I700 Atherosclerosis of aorta: Secondary | ICD-10-CM | POA: Diagnosis not present

## 2021-04-28 DIAGNOSIS — Z6841 Body Mass Index (BMI) 40.0 and over, adult: Secondary | ICD-10-CM | POA: Diagnosis not present

## 2021-04-28 DIAGNOSIS — M1710 Unilateral primary osteoarthritis, unspecified knee: Secondary | ICD-10-CM | POA: Diagnosis not present

## 2021-04-28 DIAGNOSIS — Z299 Encounter for prophylactic measures, unspecified: Secondary | ICD-10-CM | POA: Diagnosis not present

## 2021-04-28 DIAGNOSIS — I25118 Atherosclerotic heart disease of native coronary artery with other forms of angina pectoris: Secondary | ICD-10-CM | POA: Diagnosis not present

## 2021-04-28 DIAGNOSIS — I10 Essential (primary) hypertension: Secondary | ICD-10-CM | POA: Diagnosis not present

## 2021-05-04 DIAGNOSIS — I739 Peripheral vascular disease, unspecified: Secondary | ICD-10-CM | POA: Diagnosis not present

## 2021-05-04 DIAGNOSIS — I700 Atherosclerosis of aorta: Secondary | ICD-10-CM | POA: Diagnosis not present

## 2021-05-13 DIAGNOSIS — M25562 Pain in left knee: Secondary | ICD-10-CM | POA: Diagnosis not present

## 2021-05-13 DIAGNOSIS — M25561 Pain in right knee: Secondary | ICD-10-CM | POA: Diagnosis not present

## 2021-05-28 ENCOUNTER — Encounter: Payer: Self-pay | Admitting: Cardiology

## 2021-05-28 ENCOUNTER — Ambulatory Visit: Payer: Medicare HMO | Admitting: Cardiology

## 2021-05-28 VITALS — BP 142/84 | HR 80 | Ht 73.0 in | Wt 366.2 lb

## 2021-05-28 DIAGNOSIS — I2510 Atherosclerotic heart disease of native coronary artery without angina pectoris: Secondary | ICD-10-CM | POA: Diagnosis not present

## 2021-05-28 DIAGNOSIS — R0602 Shortness of breath: Secondary | ICD-10-CM | POA: Diagnosis not present

## 2021-05-28 DIAGNOSIS — I10 Essential (primary) hypertension: Secondary | ICD-10-CM | POA: Diagnosis not present

## 2021-05-28 NOTE — Progress Notes (Signed)
? ? ? ?Clinical Summary ?[name] is a 71 y.o.male seen today for follow up of the following medical problems.  ?  ?  ?1. Chest pain/CAD ?- seen 06/2017 in ER with chest pain ?- noncardiac in description. Constant for 7 days, worst with palpation ?- CT PE was negative. Trop neg x 2. EKG without acute ischemic changes ?  ?- 08/2017 nuclear stress orderded by pcp completed at [facility]: severe areas of reversibility in the apex, lateral wall, and inferior wall all comletely reversible.  ? - 08/2017 cath with 80% prox LAD lesion, s/p DES. ?  ?  Sharp pain at times, left sided, 5/10 in severity. Lasts few seconds. Not positinal, not exertional. Mild chest heaviness, 2-3 times per month. Often occurs at works. No exertional symptoms.  ?- compliant with meds ?  ?  ?  ?2. Dizziness ?- ongonig for a few months ?- mainly occurs with standing ?- 2-3 large cups of water daily. Occasional coffee. No GI losses ?- occasional palpitations ?- did fall from dizziness once.  ? ?- last visit stopped HCTZ due to orthostatic dizziness ?- since last visit symptoms have resolved ? ? ? ?  ?3. SOB ?- worsening SOB.  ?- DOE with short distances ?- no recent edema. ?- no coughing or wheezing.  ?- Wt 6/202018 325, today 366 lbs ?  ?  ? 03/2020 echo LVEF 60-65%, no WMAs, indet diastolic, normal RV ?  ?  ?  ?  ?  ?  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  ? ? ?Past Medical History:  ?Diagnosis Date  ? Anal fistula   ? Arthritis   ? both knees  ? Atrial fibrillation ([place]) 2018  ? short episode of afib, noted on event monitor  ? Coronary artery disease   ? non obstructive  ? Dyslipidemia   ? GERD (gastroesophageal reflux disease)   ? Hypertension   ? Morbid obesity ([place])   ? OSA on CPAP   ? does not use cpap, could not get cpap  ? Prostate cancer ([place])   ? Stage IIC , prostate completed radiation 06/08/17  ? PSVT (paroxysmal supraventricular tachycardia) ([place]) 2018  ?  noted on  event monitor  ? ? ? ?No Known Allergies ? ? ?Current Outpatient Medications  ?Medication Sig Dispense Refill  ? acetaminophen (TYLENOL) 500 MG tablet Take 500 mg by mouth every 6 (six) hours as needed for moderate pain or headache.    ? amLODipine (NORVASC) 5 MG tablet Take 5 mg by mouth daily.    ? aspirin EC 81 MG tablet Take 81 mg by mouth daily.    ? atorvastatin (LIPITOR) 80 MG tablet Take 1 tablet (80 mg total) by mouth daily at 6 PM. 90 tablet 3  ? Baclofen 5 MG TABS Take 1 tablet by mouth 3 (three) times daily. 15 tablet 0  ? docusate sodium (COLACE) 100 MG capsule Take 200 mg by mouth daily as needed.     ? lisinopril (PRINIVIL,ZESTRIL) 20 MG tablet Take 40 mg by mouth daily.    ? metoprolol succinate (TOPROL-XL) 50 MG 24 hr tablet Take 25 mg by mouth daily.    ? nitroGLYCERIN (NITROSTAT) 0.4 MG SL tablet Place 1 tablet (0.4 mg total) under the tongue every 5 (five) minutes as needed for chest pain. 25 tablet 3  ? pantoprazole (PROTONIX) 40 MG tablet Take 40 mg by mouth daily.  2  ? ?No current facility-administered medications for this visit.  ? ? ? ?Past Surgical History:  ?Procedure Laterality Date  ? ABDOMINAL SURGERY    ? Stomach surgery for obstruction as a child  ? CARDIAC CATHETERIZATION    ? cath  ? COLONOSCOPY    ? COLONOSCOPY N/A 12/21/2018  ? Procedure: COLONOSCOPY;  Surgeon: Rogene Houston, MD;  [facility];  Service: Endoscopy;  Laterality: N/A;  100  ? CORONARY STENT INTERVENTION  09/01/2017  ? CORONARY STENT INTERVENTION N/A 09/01/2017  ? Procedure: CORONARY STENT INTERVENTION;  Surgeon: Burnell Blanks, MD;  Location: [place] [facility];  Service: Cardiovascular;  Laterality: N/A;  ? EVALUATION UNDER ANESTHESIA WITH ANAL FISSUROTOMY N/A 06/15/2019  ? Procedure: ANAL EXAM UNDER ANESTHESIA, LIGATION OF INTERNAL FISTULA TRACT, FISTULOTOMY;  Surgeon: Leighton Ruff, MD;  Location: [place];  Service: General;  Laterality: N/A;  ? INTRAVASCULAR PRESSURE  WIRE/FFR STUDY N/A 09/01/2017  ? Procedure: INTRAVASCULAR PRESSURE WIRE/FFR STUDY;  Surgeon: Burnell Blanks, MD;  Location: [place] [facility];  Service: Cardiovascular;  Laterality: N/A;  ? LEFT HEART CATH AND CORONARY ANGIOGRAPHY N/A 09/01/2017  ? Procedure: LEFT HEART CATH AND CORONARY ANGIOGRAPHY;  Surgeon: Burnell Blanks, MD;  Location: [place] [facility];  Service: Cardiovascular;  Laterality: N/A;  ? LIGATION OF INTERNAL FISTULA TRACT N/A 03/29/2019  ? Procedure: LIGATION OF ANTERIOR FISTULA TRACT;  Surgeon: Leighton Ruff, MD;  Location: [facility];  Service: General;  Laterality: N/A;  ? [place]  11/08/2018  ? Procedure: PLACEMENT OF SETON X 4 IN TRANSPHINCTERIC FISTULA;  Surgeon: Virl Cagey, MD;  [facility]: [facility];  Service: General;;  ? ? ? ?No Known Allergies ? ? ? ?Family History  ?Problem Relation Age of Onset  ? Heart failure Father   ? Heart disease Father   ? ? ? ?Social History ?[name] reports that he has never smoked. His smokeless tobacco use includes chew. ?[name] reports no history of alcohol use. ? ? ?Review of Systems ?CONSTITUTIONAL: No weight loss, fever, chills, weakness or fatigue.  ?HEENT: Eyes: No visual loss, blurred vision, double vision or yellow sclerae.No hearing loss, sneezing, congestion, runny nose or sore throat.  ?SKIN: No rash or itching.  ?CARDIOVASCULAR: per hpi ?RESPIRATORY: per hpi ?GASTROINTESTINAL: No anorexia, nausea, vomiting or diarrhea. No abdominal pain or blood.  ?GENITOURINARY: No burning on urination, no polyuria ?NEUROLOGICAL: No headache, dizziness, syncope, paralysis, ataxia, numbness or tingling in the extremities. No change in bowel or bladder control.  ?MUSCULOSKELETAL: No muscle, back pain, joint pain or stiffness.  ?LYMPHATICS: No enlarged nodes. No history of splenectomy.  ?PSYCHIATRIC: No history of depression or anxiety.  ?ENDOCRINOLOGIC: No reports of sweating, cold or heat intolerance. No  polyuria or polydipsia.  ?. ? ? ?Physical Examination ?Today's Vitals  ? 05/28/21 1013  ?BP: (!) 142/84  ?Pulse: 80  ?SpO2: 95%  ?Weight: (!) 366 lb 3.2 oz (166.1 kg)  ?Height: '6\' 1"'$  (1.854 m)  ? ?Body mass index is 48.31 kg/m?. ? ?Gen: resting comfortably, no acute distress ?HEENT: no scleral icterus, pupils equal round and reactive, no palptable cervical adenopathy,  ?CV: RRR, no m/r/ gno jvd ?Resp: Clear to auscultation bilaterally ?GI: abdomen is soft, non-tender, non-distended, normal bowel sounds, no hepatosplenomegaly ?MSK: extremities are warm, no edema.  ?Skin: warm, no rash ?Neuro:  no focal deficits ?Psych: appropriate affect ? ? ?Diagnostic Studies ?08/2017 cath ?Prox LAD lesion is 80% stenosed. ?  A drug-eluting stent was successfully placed using a STENT SIERRA 2.75 X 12 MM. ?Post intervention, there is a 0% residual stenosis. ?The left ventricular systolic function is normal. ?LV end diastolic pressure is normal. ?The left ventricular ejection fraction is 55-65% by visual estimate. ?There is no mitral valve regurgitation. ?  ?1. Severe stenosis mid LAD. The stenosis was eccentric and in some views did not appear to be severe. FFR 0.8 in the mid LAD suggesting the stenosis was likely flow limiting. Given the abnormal FFR and the abnormal nuclear stress test, PCI was performed.  ?2. Successful PTCA/DES x 1 mid LAD ?3. Normal LV systolic function ?  ?Post cath Recommendations: Will start a statin.  ?Recommend uninterrupted dual antiplatelet therapy with Aspirin '81mg'$  daily and Clopidogrel '75mg'$  daily for a minimum of 12 months (ACS - Class I recommendation). ?  ?06/2016 echo ?Study Conclusions ?  ?- Procedure narrative: Transthoracic echocardiography. Image ?  quality was suboptimal with poor valvular and endocardial ?  visualization in several views. ?- Left ventricle: The cavity size was normal. Wall thickness was ?  increased in a pattern of mild LVH. Systolic function was ?  vigorous. The estimated  ejection fraction was in the range of 65% ?  to 70%. Images were inadequate for LV wall motion assessment. ?  Doppler parameters are consistent with abnormal left ventricular ?  relaxation (grade 1 diastolic dysfuncti

## 2021-05-28 NOTE — Patient Instructions (Signed)

## 2021-07-02 DIAGNOSIS — Z6841 Body Mass Index (BMI) 40.0 and over, adult: Secondary | ICD-10-CM | POA: Diagnosis not present

## 2021-07-02 DIAGNOSIS — I10 Essential (primary) hypertension: Secondary | ICD-10-CM | POA: Diagnosis not present

## 2021-07-02 DIAGNOSIS — Z789 Other specified health status: Secondary | ICD-10-CM | POA: Diagnosis not present

## 2021-07-02 DIAGNOSIS — Z0000 Encounter for general adult medical examination without abnormal findings: Secondary | ICD-10-CM | POA: Diagnosis not present

## 2021-07-02 DIAGNOSIS — Z1211 Encounter for screening for malignant neoplasm of colon: Secondary | ICD-10-CM | POA: Diagnosis not present

## 2021-07-02 DIAGNOSIS — Z299 Encounter for prophylactic measures, unspecified: Secondary | ICD-10-CM | POA: Diagnosis not present

## 2021-10-06 DIAGNOSIS — L739 Follicular disorder, unspecified: Secondary | ICD-10-CM | POA: Diagnosis not present

## 2021-10-06 DIAGNOSIS — I10 Essential (primary) hypertension: Secondary | ICD-10-CM | POA: Diagnosis not present

## 2021-10-06 DIAGNOSIS — Z6841 Body Mass Index (BMI) 40.0 and over, adult: Secondary | ICD-10-CM | POA: Diagnosis not present

## 2021-10-06 DIAGNOSIS — M1710 Unilateral primary osteoarthritis, unspecified knee: Secondary | ICD-10-CM | POA: Diagnosis not present

## 2021-10-06 DIAGNOSIS — Z299 Encounter for prophylactic measures, unspecified: Secondary | ICD-10-CM | POA: Diagnosis not present

## 2021-10-06 DIAGNOSIS — R609 Edema, unspecified: Secondary | ICD-10-CM | POA: Diagnosis not present

## 2021-10-06 DIAGNOSIS — Z23 Encounter for immunization: Secondary | ICD-10-CM | POA: Diagnosis not present

## 2021-10-24 DIAGNOSIS — D6869 Other thrombophilia: Secondary | ICD-10-CM | POA: Diagnosis not present

## 2021-10-24 DIAGNOSIS — K219 Gastro-esophageal reflux disease without esophagitis: Secondary | ICD-10-CM | POA: Diagnosis not present

## 2021-10-24 DIAGNOSIS — N529 Male erectile dysfunction, unspecified: Secondary | ICD-10-CM | POA: Diagnosis not present

## 2021-10-24 DIAGNOSIS — I739 Peripheral vascular disease, unspecified: Secondary | ICD-10-CM | POA: Diagnosis not present

## 2021-10-24 DIAGNOSIS — K5900 Constipation, unspecified: Secondary | ICD-10-CM | POA: Diagnosis not present

## 2021-10-24 DIAGNOSIS — Z6841 Body Mass Index (BMI) 40.0 and over, adult: Secondary | ICD-10-CM | POA: Diagnosis not present

## 2021-10-24 DIAGNOSIS — G4733 Obstructive sleep apnea (adult) (pediatric): Secondary | ICD-10-CM | POA: Diagnosis not present

## 2021-10-24 DIAGNOSIS — I4891 Unspecified atrial fibrillation: Secondary | ICD-10-CM | POA: Diagnosis not present

## 2021-10-24 DIAGNOSIS — I25119 Atherosclerotic heart disease of native coronary artery with unspecified angina pectoris: Secondary | ICD-10-CM | POA: Diagnosis not present

## 2021-10-24 DIAGNOSIS — M1990 Unspecified osteoarthritis, unspecified site: Secondary | ICD-10-CM | POA: Diagnosis not present

## 2021-10-24 DIAGNOSIS — I10 Essential (primary) hypertension: Secondary | ICD-10-CM | POA: Diagnosis not present

## 2021-12-15 ENCOUNTER — Encounter: Payer: Self-pay | Admitting: *Deleted

## 2021-12-15 ENCOUNTER — Ambulatory Visit: Payer: Medicare HMO | Attending: Cardiology | Admitting: Cardiology

## 2021-12-15 ENCOUNTER — Encounter: Payer: Self-pay | Admitting: Cardiology

## 2021-12-15 VITALS — BP 124/80 | HR 72 | Ht 73.0 in | Wt 358.0 lb

## 2021-12-15 DIAGNOSIS — R0602 Shortness of breath: Secondary | ICD-10-CM

## 2021-12-15 DIAGNOSIS — I2510 Atherosclerotic heart disease of native coronary artery without angina pectoris: Secondary | ICD-10-CM

## 2021-12-15 DIAGNOSIS — I10 Essential (primary) hypertension: Secondary | ICD-10-CM | POA: Diagnosis not present

## 2021-12-15 NOTE — Patient Instructions (Signed)
Medication Instructions:  Continue all current medications.   Labwork: none  Testing/Procedures: none  Follow-Up: 6 months   Any Other Special Instructions Will Be Listed Below (If Applicable).   If you need a refill on your cardiac medications before your next appointment, please call your pharmacy.  

## 2021-12-15 NOTE — Progress Notes (Signed)
Clinical Summary Phillip Rios is a 71 y.o.male seen today for follow up of the following medical problems.      1. Chest pain/CAD - seen 06/2017 in ER with chest pain - noncardiac in description. Constant for 7 days, worst with palpation - CT PE was negative. Trop neg x 2. EKG without acute ischemic changes   - 08/2017 nuclear stress orderded by pcp completed [facility]: severe areas of reversibility in the apex, lateral wall, and inferior wall all comletely reversible.   - 08/2017 cath with 80% prox LAD lesion, s/p DES.     Sharp pain at times, left sided, 5/10 in severity. Lasts few seconds. Not positinal, not exertional. Mild chest heaviness, 2-3 times per month. Often occurs at works. No exertional symptoms.  - compliant with meds  - chronic atypical chest pains unchanged - compliant with meds       2. Dizziness - ongonig for a few months - mainly occurs with standing - 2-3 large cups of water daily. Occasional coffee. No GI losses - occasional palpitations - did fall from dizziness once.    - last visit stopped HCTZ due to orthostatic dizziness - since last visit symptoms have resolved - mild symptoms at times.          3. SOB - worsening SOB.  - DOE with short distances - no recent edema. - no coughing or wheezing.  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    Past Medical History:  Diagnosis Date   Anal fistula    Arthritis    both knees   Atrial fibrillation ([place]) 2018   short episode of afib, noted on event monitor   Coronary artery disease    non obstructive   Dyslipidemia    GERD (gastroesophageal reflux disease)    Hypertension    Morbid obesity ([facility])    OSA on CPAP    does not use cpap, could not get cpap   Prostate cancer ([facility])    Stage IIC ,  prostate completed radiation 06/08/17   PSVT (paroxysmal supraventricular tachycardia) ([place]) 2018    noted on event monitor     No Known Allergies   Current Outpatient Medications  Medication Sig Dispense Refill   acetaminophen (TYLENOL) 500 MG tablet Take 500 mg by mouth every 6 (six) hours as needed for moderate pain or headache.     amLODipine (NORVASC) 5 MG tablet Take 5 mg by mouth daily.     aspirin EC 81 MG tablet Take 81 mg by mouth daily.     atorvastatin (LIPITOR) 80 MG tablet Take 1 tablet (80 mg total) by mouth daily at 6 PM. 90 tablet 3   Baclofen 5 MG TABS Take 1 tablet by mouth 3 (three) times daily. 15 tablet 0   docusate sodium (COLACE) 100 MG capsule Take 200 mg by mouth daily as needed.      lisinopril (PRINIVIL,ZESTRIL) 20 MG tablet Take 40 mg by mouth daily.     metoprolol succinate (TOPROL-XL) 50 MG 24 hr tablet Take 25 mg by mouth daily.     nitroGLYCERIN (NITROSTAT) 0.4 MG SL tablet Place 1 tablet (0.4 mg total) under the tongue every 5 (  five) minutes as needed for chest pain. 25 tablet 3   pantoprazole (PROTONIX) 40 MG tablet Take 40 mg by mouth daily.  2   No current facility-administered medications for this visit.     Past Surgical History:  Procedure Laterality Date   ABDOMINAL SURGERY     Stomach surgery for obstruction as a child   CARDIAC CATHETERIZATION     cath   COLONOSCOPY     COLONOSCOPY N/A 12/21/2018   Procedure: COLONOSCOPY;  Surgeon: Rogene Houston, MD;  [facility];  Service: Endoscopy;  Laterality: N/A;  100   CORONARY STENT INTERVENTION  09/01/2017   CORONARY STENT INTERVENTION N/A 09/01/2017   Procedure: CORONARY STENT INTERVENTION;  Surgeon: Burnell Blanks, MD;  Location: [place] [facility];  Service: Cardiovascular;  Laterality: N/A;   EVALUATION UNDER ANESTHESIA WITH ANAL FISSUROTOMY N/A 06/15/2019   Procedure: ANAL EXAM UNDER ANESTHESIA, LIGATION OF INTERNAL FISTULA TRACT, FISTULOTOMY;  Surgeon: Leighton Ruff,  MD;  Location: [place];  Service: General;  Laterality: N/A;   INTRAVASCULAR PRESSURE WIRE/FFR STUDY N/A 09/01/2017   Procedure: INTRAVASCULAR PRESSURE WIRE/FFR STUDY;  Surgeon: Burnell Blanks, MD;  Location: [place] [facility];  Service: Cardiovascular;  Laterality: N/A;   LEFT HEART CATH AND CORONARY ANGIOGRAPHY N/A 09/01/2017   Procedure: LEFT HEART CATH AND CORONARY ANGIOGRAPHY;  Surgeon: Burnell Blanks, MD;  Location: [place] [facility];  Service: Cardiovascular;  Laterality: N/A;   LIGATION OF INTERNAL FISTULA TRACT N/A 03/29/2019   Procedure: LIGATION OF ANTERIOR FISTULA TRACT;  Surgeon: Leighton Ruff, MD;  Location: [facility];  Service: General;  Laterality: N/A;   PLACEMENT OF SETON  11/08/2018   Procedure: PLACEMENT OF SETON X 4 IN TRANSPHINCTERIC FISTULA;  Surgeon: Virl Cagey, MD;  [facility]: [facility];  Service: General;;     No Known Allergies    Family History  Problem Relation Age of Onset   Heart failure Father    Heart disease Father      Social History Phillip Rios reports that he has never smoked. His smokeless tobacco use includes chew. Phillip Rios reports no history of alcohol use.   Review of Systems CONSTITUTIONAL: No weight loss, fever, chills, weakness or fatigue.  HEENT: Eyes: No visual loss, blurred vision, double vision or yellow sclerae.No hearing loss, sneezing, congestion, runny nose or sore throat.  SKIN: No rash or itching.  CARDIOVASCULAR: per hpi RESPIRATORY: per hpi GASTROINTESTINAL: No anorexia, nausea, vomiting or diarrhea. No abdominal pain or blood.  GENITOURINARY: No burning on urination, no polyuria NEUROLOGICAL: No headache, dizziness, syncope, paralysis, ataxia, numbness or tingling in the extremities. No change in bowel or bladder control.  MUSCULOSKELETAL: No muscle, back pain, joint pain or stiffness.  LYMPHATICS: No enlarged nodes. No history of splenectomy.  PSYCHIATRIC: No  history of depression or anxiety.  ENDOCRINOLOGIC: No reports of sweating, cold or heat intolerance. No polyuria or polydipsia.  Marland Kitchen   Physical Examination Today's Vitals   12/15/21 0943  BP: 124/80  Pulse: 72  SpO2: 93%  Weight: (!) 358 lb (162.4 kg)  Height: '6\' 1"'$  (1.854 m)   Body mass index is 47.23 kg/m.  Gen: resting comfortably, no acute distress HEENT: no scleral icterus, pupils equal round and reactive, no palptable cervical adenopathy,  CV: RRR, no m/r/g no jvd Resp: Clear to auscultation bilaterally GI: abdomen is soft, non-tender, non-distended, normal bowel sounds, no hepatosplenomegaly MSK: extremities are warm, no edema.  Skin: warm, no  rash Neuro:  no focal deficits Psych: appropriate affect   Diagnostic Studies  08/2017 cath Prox LAD lesion is 80% stenosed. A drug-eluting stent was successfully placed using a STENT SIERRA 2.75 X 12 MM. Post intervention, there is a 0% residual stenosis. The left ventricular systolic function is normal. LV end diastolic pressure is normal. The left ventricular ejection fraction is 55-65% by visual estimate. There is no mitral valve regurgitation.   1. Severe stenosis mid LAD. The stenosis was eccentric and in some views did not appear to be severe. FFR 0.8 in the mid LAD suggesting the stenosis was likely flow limiting. Given the abnormal FFR and the abnormal nuclear stress test, PCI was performed.  2. Successful PTCA/DES x 1 mid LAD 3. Normal LV systolic function   Post cath Recommendations: Will start a statin.  Recommend uninterrupted dual antiplatelet therapy with Aspirin '81mg'$  daily and Clopidogrel '75mg'$  daily for a minimum of 12 months (ACS - Class I recommendation).   06/2016 echo Study Conclusions   - Procedure narrative: Transthoracic echocardiography. Image   quality was suboptimal with poor valvular and endocardial   visualization in several views. - Left ventricle: The cavity size was normal. Wall thickness  was   increased in a pattern of mild LVH. Systolic function was   vigorous. The estimated ejection fraction was in the range of 65%   to 70%. Images were inadequate for LV wall motion assessment.   Doppler parameters are consistent with abnormal left ventricular   relaxation (grade 1 diastolic dysfunction). - Aorta: Mild aortic root enlargement.     03/2020 echo 1. Left ventricular ejection fraction, by estimation, is 60 to 65%. The  left ventricle has normal function. Left ventricular endocardial border  not optimally defined to evaluate regional wall motion. There is mild left  ventricular hypertrophy. Left  ventricular diastolic parameters are indeterminate.   2. Right ventricular systolic function is normal. The right ventricular  size is normal.   3. The mitral valve is normal in structure. No evidence of mitral valve  regurgitation. No evidence of mitral stenosis.   4. The aortic valve was not well visualized. Aortic valve regurgitation  is not visualized. No aortic stenosis is present.   5. Aortic dilatation noted. There is mild dilatation of the ascending  aorta, measuring 40 mm.        Assessment and Plan   1. CAD - chronic atypical chest pains - continue current meds   2. HTN - orthostatic dizzienss improved with stopping HCTZ.  - mild ongoing dizziness, monitor. May need to cut back bp meds further pending symptoms.    3. SOB - recent benign echo - 40 lbs weight gain over the last several years, sedentary lifestyle.I think SOB is related, no plans for additioanl cardiac testing at this time        Arnoldo Lenis, M.D.

## 2021-12-24 DIAGNOSIS — Z299 Encounter for prophylactic measures, unspecified: Secondary | ICD-10-CM | POA: Diagnosis not present

## 2021-12-24 DIAGNOSIS — Z1339 Encounter for screening examination for other mental health and behavioral disorders: Secondary | ICD-10-CM | POA: Diagnosis not present

## 2021-12-24 DIAGNOSIS — Z0000 Encounter for general adult medical examination without abnormal findings: Secondary | ICD-10-CM | POA: Diagnosis not present

## 2021-12-24 DIAGNOSIS — Z6841 Body Mass Index (BMI) 40.0 and over, adult: Secondary | ICD-10-CM | POA: Diagnosis not present

## 2021-12-24 DIAGNOSIS — Z7189 Other specified counseling: Secondary | ICD-10-CM | POA: Diagnosis not present

## 2021-12-24 DIAGNOSIS — Z79899 Other long term (current) drug therapy: Secondary | ICD-10-CM | POA: Diagnosis not present

## 2021-12-24 DIAGNOSIS — R5383 Other fatigue: Secondary | ICD-10-CM | POA: Diagnosis not present

## 2021-12-24 DIAGNOSIS — Z125 Encounter for screening for malignant neoplasm of prostate: Secondary | ICD-10-CM | POA: Diagnosis not present

## 2021-12-24 DIAGNOSIS — E7800 Pure hypercholesterolemia, unspecified: Secondary | ICD-10-CM | POA: Diagnosis not present

## 2021-12-24 DIAGNOSIS — Z1331 Encounter for screening for depression: Secondary | ICD-10-CM | POA: Diagnosis not present

## 2022-01-07 DIAGNOSIS — Z713 Dietary counseling and surveillance: Secondary | ICD-10-CM | POA: Diagnosis not present

## 2022-01-07 DIAGNOSIS — I10 Essential (primary) hypertension: Secondary | ICD-10-CM | POA: Diagnosis not present

## 2022-01-07 DIAGNOSIS — M1710 Unilateral primary osteoarthritis, unspecified knee: Secondary | ICD-10-CM | POA: Diagnosis not present

## 2022-01-07 DIAGNOSIS — Z299 Encounter for prophylactic measures, unspecified: Secondary | ICD-10-CM | POA: Diagnosis not present

## 2022-01-07 DIAGNOSIS — Z6841 Body Mass Index (BMI) 40.0 and over, adult: Secondary | ICD-10-CM | POA: Diagnosis not present

## 2022-02-08 DIAGNOSIS — I480 Paroxysmal atrial fibrillation: Secondary | ICD-10-CM | POA: Diagnosis not present

## 2022-02-08 DIAGNOSIS — I10 Essential (primary) hypertension: Secondary | ICD-10-CM | POA: Diagnosis not present

## 2022-02-08 DIAGNOSIS — Z299 Encounter for prophylactic measures, unspecified: Secondary | ICD-10-CM | POA: Diagnosis not present

## 2022-02-08 DIAGNOSIS — I25118 Atherosclerotic heart disease of native coronary artery with other forms of angina pectoris: Secondary | ICD-10-CM | POA: Diagnosis not present

## 2022-02-08 DIAGNOSIS — M1710 Unilateral primary osteoarthritis, unspecified knee: Secondary | ICD-10-CM | POA: Diagnosis not present

## 2022-02-16 DIAGNOSIS — Z299 Encounter for prophylactic measures, unspecified: Secondary | ICD-10-CM | POA: Diagnosis not present

## 2022-02-16 DIAGNOSIS — I10 Essential (primary) hypertension: Secondary | ICD-10-CM | POA: Diagnosis not present

## 2022-02-16 DIAGNOSIS — R42 Dizziness and giddiness: Secondary | ICD-10-CM | POA: Diagnosis not present

## 2022-02-16 DIAGNOSIS — Z6841 Body Mass Index (BMI) 40.0 and over, adult: Secondary | ICD-10-CM | POA: Diagnosis not present

## 2022-02-23 DIAGNOSIS — E7800 Pure hypercholesterolemia, unspecified: Secondary | ICD-10-CM | POA: Diagnosis not present

## 2022-02-23 DIAGNOSIS — Z299 Encounter for prophylactic measures, unspecified: Secondary | ICD-10-CM | POA: Diagnosis not present

## 2022-02-23 DIAGNOSIS — E250 Congenital adrenogenital disorders associated with enzyme deficiency: Secondary | ICD-10-CM | POA: Diagnosis not present

## 2022-02-23 DIAGNOSIS — I10 Essential (primary) hypertension: Secondary | ICD-10-CM | POA: Diagnosis not present

## 2022-02-23 DIAGNOSIS — I25118 Atherosclerotic heart disease of native coronary artery with other forms of angina pectoris: Secondary | ICD-10-CM | POA: Diagnosis not present

## 2022-03-18 DIAGNOSIS — J069 Acute upper respiratory infection, unspecified: Secondary | ICD-10-CM | POA: Diagnosis not present

## 2022-03-18 DIAGNOSIS — R059 Cough, unspecified: Secondary | ICD-10-CM | POA: Diagnosis not present

## 2022-03-18 DIAGNOSIS — Z299 Encounter for prophylactic measures, unspecified: Secondary | ICD-10-CM | POA: Diagnosis not present

## 2022-03-24 DIAGNOSIS — I25118 Atherosclerotic heart disease of native coronary artery with other forms of angina pectoris: Secondary | ICD-10-CM | POA: Diagnosis not present

## 2022-03-24 DIAGNOSIS — Z79899 Other long term (current) drug therapy: Secondary | ICD-10-CM | POA: Diagnosis not present

## 2022-03-24 DIAGNOSIS — M1710 Unilateral primary osteoarthritis, unspecified knee: Secondary | ICD-10-CM | POA: Diagnosis not present

## 2022-03-24 DIAGNOSIS — Z299 Encounter for prophylactic measures, unspecified: Secondary | ICD-10-CM | POA: Diagnosis not present

## 2022-03-24 DIAGNOSIS — I10 Essential (primary) hypertension: Secondary | ICD-10-CM | POA: Diagnosis not present

## 2022-04-21 DIAGNOSIS — Z6841 Body Mass Index (BMI) 40.0 and over, adult: Secondary | ICD-10-CM | POA: Diagnosis not present

## 2022-04-21 DIAGNOSIS — M25569 Pain in unspecified knee: Secondary | ICD-10-CM | POA: Diagnosis not present

## 2022-04-21 DIAGNOSIS — R7303 Prediabetes: Secondary | ICD-10-CM | POA: Diagnosis not present

## 2022-04-21 DIAGNOSIS — I10 Essential (primary) hypertension: Secondary | ICD-10-CM | POA: Diagnosis not present

## 2022-04-21 DIAGNOSIS — Z299 Encounter for prophylactic measures, unspecified: Secondary | ICD-10-CM | POA: Diagnosis not present

## 2022-05-07 DIAGNOSIS — M25561 Pain in right knee: Secondary | ICD-10-CM | POA: Diagnosis not present

## 2022-05-07 DIAGNOSIS — M25562 Pain in left knee: Secondary | ICD-10-CM | POA: Diagnosis not present

## 2022-05-07 DIAGNOSIS — I10 Essential (primary) hypertension: Secondary | ICD-10-CM | POA: Diagnosis not present

## 2022-05-07 DIAGNOSIS — D179 Benign lipomatous neoplasm, unspecified: Secondary | ICD-10-CM | POA: Diagnosis not present

## 2022-05-07 DIAGNOSIS — Z299 Encounter for prophylactic measures, unspecified: Secondary | ICD-10-CM | POA: Diagnosis not present

## 2022-05-31 ENCOUNTER — Other Ambulatory Visit: Payer: Self-pay | Admitting: *Deleted

## 2022-05-31 DIAGNOSIS — D1723 Benign lipomatous neoplasm of skin and subcutaneous tissue of right leg: Secondary | ICD-10-CM

## 2022-06-01 ENCOUNTER — Encounter: Payer: Self-pay | Admitting: General Surgery

## 2022-06-01 ENCOUNTER — Ambulatory Visit (INDEPENDENT_AMBULATORY_CARE_PROVIDER_SITE_OTHER): Payer: HMO | Admitting: General Surgery

## 2022-06-01 VITALS — BP 92/63 | HR 74 | Temp 98.3°F | Resp 16 | Ht 73.0 in | Wt 309.0 lb

## 2022-06-01 DIAGNOSIS — D1723 Benign lipomatous neoplasm of skin and subcutaneous tissue of right leg: Secondary | ICD-10-CM

## 2022-06-01 NOTE — Progress Notes (Signed)
[name] History and Physical  Reason for Referral: Lipoma on right calf  Referring Physician: Kirstie Peri, MD    Chief Complaint   New Patient (Initial Visit)     Phillip Rios is a 72 y.o. male.  HPI: [name] is a 72 yo who has noticed a mass on the right calf for years likely over 6. He has noticed it growing and it gets in his way when he is laying down. He has never noticed any drainage.   Past Medical History:  Diagnosis Date   Anal fistula    Arthritis    both knees   Atrial fibrillation ([facility]) 2018   short episode of afib, noted on event monitor   Coronary artery disease    non obstructive   Dyslipidemia    GERD (gastroesophageal reflux disease)    Hypertension    Morbid obesity ([facility])    OSA on CPAP    does not use cpap, could not get cpap   Prostate cancer ([facility])    Stage IIC , prostate completed radiation 06/08/17   PSVT (paroxysmal supraventricular tachycardia) 2018    noted on event monitor    Past Surgical History:  Procedure Laterality Date   ABDOMINAL SURGERY     Stomach surgery for obstruction as a child   CARDIAC CATHETERIZATION     cath   COLONOSCOPY     COLONOSCOPY N/A 12/21/2018   Procedure: COLONOSCOPY;  Surgeon: Malissa Hippo, MD;  [facility];  Service: Endoscopy;  Laterality: N/A;  100   CORONARY PRESSURE/FFR STUDY N/A 09/01/2017   Procedure: INTRAVASCULAR PRESSURE WIRE/FFR STUDY;  Surgeon: Kathleene Hazel, MD;  [facility];  Service: Cardiovascular;  Laterality: N/A;   CORONARY STENT INTERVENTION  09/01/2017   CORONARY STENT INTERVENTION N/A 09/01/2017   Procedure: CORONARY STENT INTERVENTION;  Surgeon: Kathleene Hazel, MD;  [facility];  Service: Cardiovascular;  Laterality: N/A;   EVALUATION UNDER ANESTHESIA WITH ANAL FISSUROTOMY N/A 06/15/2019   Procedure: ANAL EXAM UNDER ANESTHESIA, LIGATION OF INTERNAL FISTULA TRACT, FISTULOTOMY;  Surgeon: Romie Levee, MD;  Location: [facility] [place];  Service: General;  Laterality: N/A;   LEFT HEART CATH AND CORONARY ANGIOGRAPHY N/A 09/01/2017   Procedure: LEFT HEART CATH AND CORONARY ANGIOGRAPHY;  Surgeon: Kathleene Hazel, MD;  [facility];  Service: Cardiovascular;  Laterality: N/A;   LIGATION OF INTERNAL FISTULA TRACT N/A 03/29/2019   Procedure: LIGATION OF ANTERIOR FISTULA TRACT;  Surgeon: Romie Levee, MD;  Location: [facility];  Service: General;  Laterality: N/A;   PLACEMENT OF SETON  11/08/2018   Procedure: PLACEMENT OF SETON X 4 IN TRANSPHINCTERIC FISTULA;  Surgeon: Lucretia Roers, MD;  Location: [facility];  Service: General;;    Family History  Problem Relation Age of Onset   Heart failure Father    Heart disease Father     Social History   Tobacco Use   Smoking status: Never   Smokeless tobacco: Current    Types: Chew  Vaping Use   Vaping Use: Never used  Substance Use Topics   Alcohol use: No   Drug use: No    Medications: I have reviewed the patient's current medications. Allergies as of 06/01/2022   No Known Allergies      Medication List        Accurate as of Jun 01, 2022 11:21 AM. If you have any questions, ask your nurse  or doctor.          STOP taking these medications    amLODipine 5 MG tablet Commonly known as: NORVASC Stopped by: Lucretia Roers, MD   traMADol 50 MG tablet Commonly known as: ULTRAM Stopped by: Lucretia Roers, MD       TAKE these medications    acetaminophen 500 MG tablet Commonly known as: TYLENOL Take 500 mg by mouth every 6 (six) hours as needed for moderate pain or headache.   aspirin EC 81 MG tablet Take 81 mg by mouth daily.   atorvastatin 80 MG tablet Commonly known as: LIPITOR Take 1 tablet (80 mg total) by mouth daily at 6 PM.   docusate sodium 100 MG capsule Commonly known as: COLACE Take 200 mg by mouth daily as needed.   hydrochlorothiazide 25 MG  tablet Commonly known as: HYDRODIURIL Take 25 mg by mouth daily.   HYDROcodone-acetaminophen 5-325 MG tablet Commonly known as: NORCO/VICODIN Take 1 tablet by mouth 2 (two) times daily.   lisinopril 20 MG tablet Commonly known as: ZESTRIL Take 40 mg by mouth daily.   metoprolol succinate 50 MG 24 hr tablet Commonly known as: TOPROL-XL Take 25 mg by mouth daily.   nitroGLYCERIN 0.4 MG SL tablet Commonly known as: NITROSTAT Place 1 tablet (0.4 mg total) under the tongue every 5 (five) minutes as needed for chest pain.   pantoprazole 40 MG tablet Commonly known as: PROTONIX Take 40 mg by mouth daily.   tamsulosin 0.4 MG Caps capsule Commonly known as: FLOMAX Take 0.4 mg by mouth daily.         ROS:  A comprehensive review of systems was negative except for: Gastrointestinal: positive for reflux symptoms Musculoskeletal: positive for knee joint pain, left mass right posterior calf Neurological: positive for dizziness  Blood pressure 92/63, pulse 74, temperature 98.3 F (36.8 C), temperature source Oral, resp. rate 16, height 6\' 1"  (1.854 m), weight (!) 309 lb (140.2 kg), SpO2 93 %. Physical Exam Vitals reviewed.  Constitutional:      Appearance: Normal appearance.  HENT:     Head: Normocephalic.     Nose: Nose normal.  Eyes:     Extraocular Movements: Extraocular movements intact.  Cardiovascular:     Rate and Rhythm: Normal rate and regular rhythm.  Pulmonary:     Effort: Pulmonary effort is normal.     Breath sounds: Normal breath sounds.  Abdominal:     General: There is no distension.     Palpations: Abdomen is soft.     Tenderness: There is no abdominal tenderness.  Musculoskeletal:        General: Normal range of motion.     Comments: Right posterior calf, 3-4cm in size, mobile and superficial   Skin:    General: Skin is warm.  Neurological:     General: No focal deficit present.     Mental Status: He is alert and oriented to person, place, and time.   Psychiatric:        Mood and Affect: Mood normal.        Behavior: Behavior normal.        Thought Content: Thought content normal.        Judgment: Judgment normal.     Results: None   Assessment & Plan:  Phillip Rios is a 72 y.o. male with lipoma on the right posterior calf.  Discussed excision with local in the office. Discussed risk of bleeding, infection, finding something other than  an lipoma.   Future Appointments  Date Time Provider Department Center  06/24/2022  9:20 AM Antoine Poche, MD [id number]  07/15/2022  3:00 PM Lucretia Roers, MD [facility] None     All questions were answered to the satisfaction of the patient and family   Lucretia Roers 06/01/2022, 11:21 AM

## 2022-06-01 NOTE — Patient Instructions (Signed)
Lipoma Removal  Lipoma removal is a surgical procedure to remove a lipoma, which is a noncancerous (benign) tumor that is made up of fat cells. Most lipomas are small and painless and do not require treatment. They can form in many areas of the body but are most common under the skin of the back, arms, shoulders, buttocks, and thighs. You may need lipoma removal if you have a lipoma that is large, growing, or causing discomfort. Lipoma removal may also be done for cosmetic reasons. Tell a health care provider about: Any allergies you have. All medicines you are taking, including vitamins, herbs, eye drops, creams, and over-the-counter medicines. Any problems you or family members have had with anesthetic medicines. Any bleeding problems you have. Any surgeries you have had. Any medical conditions you have. Whether you are pregnant or may be pregnant. What are the risks? Generally, this is a safe procedure. However, problems may occur, including: Infection. Bleeding. Scarring. Allergic reactions to medicines. Damage to nearby structures or organs, such as damage to nerves or blood vessels near the lipoma. What happens before the procedure? Medicines Ask your health care provider about: Changing or stopping your regular medicines. This is especially important if you are taking diabetes medicines or blood thinners. Taking medicines such as aspirin and ibuprofen. These medicines can thin your blood. Do not take these medicines unless your health care provider tells you to take them. Taking over-the-counter medicines, vitamins, herbs, and supplements. General instructions You will have a physical exam. Your health care provider will check the size of the lipoma and whether it can be removed easily. You may have a biopsy and imaging tests, such as X-rays, a CT scan, and an MRI. Do not use any products that contain nicotine or tobacco for at least 4 weeks before the procedure. These products  include cigarettes, chewing tobacco, and vaping devices, such as e-cigarettes. If you need help quitting, ask your health care provider. Ask your health care provider: How your surgery site will be marked. What steps will be taken to help prevent infection. These may include: Washing skin with a germ-killing soap. Taking antibiotic medicine. If you will be going home right after the procedure, plan to have a responsible adult: Take you home from the hospital or clinic. You will not be allowed to drive. Care for you for the time you are told. What happens during the procedure?  An IV will be inserted into one of your veins. You will be given one or more of the following: A medicine to help you relax (sedative). A medicine to numb the area (local anesthetic). A medicine to make you fall asleep (general anesthetic). A medicine that is injected into an area of your body to numb everything below the injection site (regional anesthetic). An incision will be made into the skin over the lipoma or very near the lipoma. The incision may be made in a natural skin line or crease. Tissues, nerves, and blood vessels near the lipoma will be moved out of the way. The lipoma and the capsule that surrounds it will be separated from the surrounding tissues. The lipoma will be removed. The incision may be closed with stitches (sutures). A bandage (dressing) will be placed over the incision. The procedure may vary among health care providers and hospitals. What happens after the procedure? Your blood pressure, heart rate, breathing rate, and blood oxygen level will be monitored until you leave the hospital or clinic. If you were prescribed an antibiotic medicine,   use it as told by your health care provider. Do not stop using the antibiotic even if you start to feel better. If you were given a sedative during the procedure, it can affect you for several hours. Do not drive or operate machinery until your health  care provider says that it is safe. Where to find more information OrthoInfo: orthoinfo.aaos.org Summary Before the procedure, follow instructions from your health care provider about eating and drinking, and changing or stopping your regular medicines. This is especially important if you are taking diabetes medicines or blood thinners. After the lipoma is removed, the incision may be closed with stitches (sutures) and covered with a bandage (dressing). If you were given a sedative during the procedure, it can affect you for several hours. Do not drive or operate machinery until your health care provider says that it is safe. This information is not intended to replace advice given to you by your health care provider. Make sure you discuss any questions you have with your health care provider. Document Revised: 01/23/2021 Document Reviewed: 01/23/2021 Elsevier Patient Education  2023 Elsevier Inc.  

## 2022-06-15 DIAGNOSIS — Z299 Encounter for prophylactic measures, unspecified: Secondary | ICD-10-CM | POA: Diagnosis not present

## 2022-06-15 DIAGNOSIS — M1710 Unilateral primary osteoarthritis, unspecified knee: Secondary | ICD-10-CM | POA: Diagnosis not present

## 2022-06-15 DIAGNOSIS — K5732 Diverticulitis of large intestine without perforation or abscess without bleeding: Secondary | ICD-10-CM | POA: Diagnosis not present

## 2022-06-15 DIAGNOSIS — I10 Essential (primary) hypertension: Secondary | ICD-10-CM | POA: Diagnosis not present

## 2022-06-23 DIAGNOSIS — R109 Unspecified abdominal pain: Secondary | ICD-10-CM | POA: Diagnosis not present

## 2022-06-23 DIAGNOSIS — K5730 Diverticulosis of large intestine without perforation or abscess without bleeding: Secondary | ICD-10-CM | POA: Diagnosis not present

## 2022-06-23 DIAGNOSIS — K5732 Diverticulitis of large intestine without perforation or abscess without bleeding: Secondary | ICD-10-CM | POA: Diagnosis not present

## 2022-06-23 DIAGNOSIS — Z6841 Body Mass Index (BMI) 40.0 and over, adult: Secondary | ICD-10-CM | POA: Diagnosis not present

## 2022-06-24 ENCOUNTER — Ambulatory Visit: Payer: Self-pay | Admitting: Cardiology

## 2022-06-24 NOTE — Progress Notes (Deleted)
Clinical Summary Phillip Rios is a 72 y.o.male seen today for follow up of the following medical problems.      1. Chest pain/CAD - seen 06/2017 in ER with chest pain - noncardiac in description. Constant for 7 days, worst with palpation - CT PE was negative. Trop neg x 2. EKG without acute ischemic changes   - 08/2017 nuclear stress orderded by pcp completed at [facility]: severe areas of reversibility in the apex, lateral wall, and inferior wall all comletely reversible.   - 08/2017 cath with 80% prox LAD lesion, s/p DES.     Sharp pain at times, left sided, 5/10 in severity. Lasts few seconds. Not positinal, not exertional. Mild chest heaviness, 2-3 times per month. Often occurs at works. No exertional symptoms.  - compliant with meds   - chronic atypical chest pains unchanged - compliant with meds       2. Dizziness - ongonig for a few months - mainly occurs with standing - 2-3 large cups of water daily. Occasional coffee. No GI losses - occasional palpitations - did fall from dizziness once.    - last visit stopped HCTZ due to orthostatic dizziness - since last visit symptoms have resolved - mild symptoms at times.          3. SOB - worsening SOB.  - DOE with short distances - no recent edema. - no coughing or wheezing.  - Wt 6/202018 325, today 366 lbs      03/2020 echo LVEF 60-65%, no WMAs, indet diastolic, normal RV  -ongoing SOB           SH: works as Games developer. Currently out of work due to severe knee pain, reports he needs a replacement.    Past Medical History:  Diagnosis Date   Anal fistula    Arthritis    both knees   Atrial fibrillation (HCC) 2018   short episode of afib, noted on event monitor   Coronary artery disease    non obstructive   Dyslipidemia    GERD (gastroesophageal reflux disease)    Hypertension    Morbid obesity (HCC)    OSA on CPAP    does not use cpap, could not get cpap   Prostate cancer (HCC)    Stage IIC ,  prostate completed radiation 06/08/17   PSVT (paroxysmal supraventricular tachycardia) 2018    noted on event monitor     No Known Allergies   Current Outpatient Medications  Medication Sig Dispense Refill   acetaminophen (TYLENOL) 500 MG tablet Take 500 mg by mouth every 6 (six) hours as needed for moderate pain or headache.     aspirin EC 81 MG tablet Take 81 mg by mouth daily.     atorvastatin (LIPITOR) 80 MG tablet Take 1 tablet (80 mg total) by mouth daily at 6 PM. 90 tablet 3   docusate sodium (COLACE) 100 MG capsule Take 200 mg by mouth daily as needed.      hydrochlorothiazide (HYDRODIURIL) 25 MG tablet Take 25 mg by mouth daily.     HYDROcodone-acetaminophen (NORCO/VICODIN) 5-325 MG tablet Take 1 tablet by mouth 2 (two) times daily.     lisinopril (PRINIVIL,ZESTRIL) 20 MG tablet Take 40 mg by mouth daily.     metoprolol succinate (TOPROL-XL) 50 MG 24 hr tablet Take 25 mg by mouth daily.     nitroGLYCERIN (NITROSTAT) 0.4 MG SL tablet Place 1 tablet (0.4 mg total) under the tongue every 5 (  five) minutes as needed for chest pain. 25 tablet 3   pantoprazole (PROTONIX) 40 MG tablet Take 40 mg by mouth daily.  2   tamsulosin (FLOMAX) 0.4 MG CAPS capsule Take 0.4 mg by mouth daily.     No current facility-administered medications for this visit.     Past Surgical History:  Procedure Laterality Date   ABDOMINAL SURGERY     Stomach surgery for obstruction as a child   CARDIAC CATHETERIZATION     cath   COLONOSCOPY     COLONOSCOPY N/A 12/21/2018   Procedure: COLONOSCOPY;  Surgeon: Malissa Hippo, MD;  Location: [facility];  Service: Endoscopy;  Laterality: N/A;  100   CORONARY PRESSURE/FFR STUDY N/A 09/01/2017   Procedure: INTRAVASCULAR PRESSURE WIRE/FFR STUDY;  Surgeon: Kathleene Hazel, MD;  Location: [facility];  Service: Cardiovascular;  Laterality: N/A;   CORONARY STENT INTERVENTION  09/01/2017   CORONARY STENT INTERVENTION N/A 09/01/2017   Procedure:  CORONARY STENT INTERVENTION;  Surgeon: Kathleene Hazel, MD;  Location: [facility];  Service: Cardiovascular;  Laterality: N/A;   EVALUATION UNDER ANESTHESIA WITH ANAL FISSUROTOMY N/A 06/15/2019   Procedure: ANAL EXAM UNDER ANESTHESIA, LIGATION OF INTERNAL FISTULA TRACT, FISTULOTOMY;  Surgeon: Romie Levee, MD;  Location: [facility];  Service: General;  Laterality: N/A;   LEFT HEART CATH AND CORONARY ANGIOGRAPHY N/A 09/01/2017   Procedure: LEFT HEART CATH AND CORONARY ANGIOGRAPHY;  Surgeon: Kathleene Hazel, MD;  Location: [facility];  Service: Cardiovascular;  Laterality: N/A;   LIGATION OF INTERNAL FISTULA TRACT N/A 03/29/2019   Procedure: LIGATION OF ANTERIOR FISTULA TRACT;  Surgeon: Romie Levee, MD;  Location: [facility];  Service: General;  Laterality: N/A;   PLACEMENT OF SETON  11/08/2018   Procedure: PLACEMENT OF SETON X 4 IN TRANSPHINCTERIC FISTULA;  Surgeon: Lucretia Roers, MD;  Location: [facility];  Service: General;;     No Known Allergies    Family History  Problem Relation Age of Onset   Heart failure Father    Heart disease Father      Social History Phillip Rios reports that he has never smoked. His smokeless tobacco use includes chew. Phillip Rios reports no history of alcohol use.   Review of Systems CONSTITUTIONAL: No weight loss, fever, chills, weakness or fatigue.  HEENT: Eyes: No visual loss, blurred vision, double vision or yellow sclerae.No hearing loss, sneezing, congestion, runny nose or sore throat.  SKIN: No rash or itching.  CARDIOVASCULAR:  RESPIRATORY: No shortness of breath, cough or sputum.  GASTROINTESTINAL: No anorexia, nausea, vomiting or diarrhea. No abdominal pain or blood.  GENITOURINARY: No burning on urination, no polyuria NEUROLOGICAL: No headache, dizziness, syncope, paralysis, ataxia, numbness or tingling in the extremities. No change in bowel or bladder control.   MUSCULOSKELETAL: No muscle, back pain, joint pain or stiffness.  LYMPHATICS: No enlarged nodes. No history of splenectomy.  PSYCHIATRIC: No history of depression or anxiety.  ENDOCRINOLOGIC: No reports of sweating, cold or heat intolerance. No polyuria or polydipsia.  Marland Kitchen   Physical Examination There were no vitals filed for this visit. There were no vitals filed for this visit.  Gen: resting comfortably, no acute distress HEENT: no scleral icterus, pupils equal round and reactive, no palptable cervical adenopathy,  CV Resp: Clear to auscultation bilaterally GI: abdomen is soft, non-tender, non-distended, normal bowel sounds, no hepatosplenomegaly MSK: extremities are warm, no edema.  Skin: warm, no rash Neuro:  no focal  deficits Psych: appropriate affect   Diagnostic Studies  08/2017 cath Prox LAD lesion is 80% stenosed. A drug-eluting stent was successfully placed using a STENT SIERRA 2.75 X 12 MM. Post intervention, there is a 0% residual stenosis. The left ventricular systolic function is normal. LV end diastolic pressure is normal. The left ventricular ejection fraction is 55-65% by visual estimate. There is no mitral valve regurgitation.   1. Severe stenosis mid LAD. The stenosis was eccentric and in some views did not appear to be severe. FFR 0.8 in the mid LAD suggesting the stenosis was likely flow limiting. Given the abnormal FFR and the abnormal nuclear stress test, PCI was performed.  2. Successful PTCA/DES x 1 mid LAD 3. Normal LV systolic function   Post cath Recommendations: Will start a statin.  Recommend uninterrupted dual antiplatelet therapy with Aspirin 81mg  daily and Clopidogrel 75mg  daily for a minimum of 12 months (ACS - Class I recommendation).   06/2016 echo Study Conclusions   - Procedure narrative: Transthoracic echocardiography. Image   quality was suboptimal with poor valvular and endocardial   visualization in several views. - Left ventricle:  The cavity size was normal. Wall thickness was   increased in a pattern of mild LVH. Systolic function was   vigorous. The estimated ejection fraction was in the range of 65%   to 70%. Images were inadequate for LV wall motion assessment.   Doppler parameters are consistent with abnormal left ventricular   relaxation (grade 1 diastolic dysfunction). - Aorta: Mild aortic root enlargement.     03/2020 echo 1. Left ventricular ejection fraction, by estimation, is 60 to 65%. The  left ventricle has normal function. Left ventricular endocardial border  not optimally defined to evaluate regional wall motion. There is mild left  ventricular hypertrophy. Left  ventricular diastolic parameters are indeterminate.   2. Right ventricular systolic function is normal. The right ventricular  size is normal.   3. The mitral valve is normal in structure. No evidence of mitral valve  regurgitation. No evidence of mitral stenosis.   4. The aortic valve was not well visualized. Aortic valve regurgitation  is not visualized. No aortic stenosis is present.   5. Aortic dilatation noted. There is mild dilatation of the ascending  aorta, measuring 40 mm.    Assessment and Plan   1. CAD - chronic atypical chest pains - continue current meds   2. HTN - orthostatic dizzienss improved with stopping HCTZ.  - mild ongoing dizziness, monitor. May need to cut back bp meds further pending symptoms.    3. SOB - recent benign echo - 40 lbs weight gain over the last several years, sedentary lifestyle.I think SOB is related, no plans for additioanl cardiac testing at this time     Antoine Poche, M.D., F.A.C.C.

## 2022-06-30 DIAGNOSIS — E7800 Pure hypercholesterolemia, unspecified: Secondary | ICD-10-CM | POA: Diagnosis not present

## 2022-06-30 DIAGNOSIS — Z125 Encounter for screening for malignant neoplasm of prostate: Secondary | ICD-10-CM | POA: Diagnosis not present

## 2022-06-30 DIAGNOSIS — Z1211 Encounter for screening for malignant neoplasm of colon: Secondary | ICD-10-CM | POA: Diagnosis not present

## 2022-06-30 DIAGNOSIS — I10 Essential (primary) hypertension: Secondary | ICD-10-CM | POA: Diagnosis not present

## 2022-06-30 DIAGNOSIS — R5383 Other fatigue: Secondary | ICD-10-CM | POA: Diagnosis not present

## 2022-06-30 DIAGNOSIS — Z299 Encounter for prophylactic measures, unspecified: Secondary | ICD-10-CM | POA: Diagnosis not present

## 2022-06-30 DIAGNOSIS — Z0000 Encounter for general adult medical examination without abnormal findings: Secondary | ICD-10-CM | POA: Diagnosis not present

## 2022-06-30 DIAGNOSIS — Z79899 Other long term (current) drug therapy: Secondary | ICD-10-CM | POA: Diagnosis not present

## 2022-07-15 ENCOUNTER — Encounter: Payer: Self-pay | Admitting: General Surgery

## 2022-07-15 ENCOUNTER — Ambulatory Visit (INDEPENDENT_AMBULATORY_CARE_PROVIDER_SITE_OTHER): Payer: HMO | Admitting: General Surgery

## 2022-07-15 VITALS — BP 108/70 | HR 64 | Temp 98.4°F | Resp 14 | Ht 73.0 in | Wt 300.0 lb

## 2022-07-15 DIAGNOSIS — D1723 Benign lipomatous neoplasm of skin and subcutaneous tissue of right leg: Secondary | ICD-10-CM

## 2022-07-15 DIAGNOSIS — D179 Benign lipomatous neoplasm, unspecified: Secondary | ICD-10-CM | POA: Diagnosis not present

## 2022-07-15 NOTE — Patient Instructions (Signed)
Leave pressure dressing in place for 24 hours. Remove if feeling too tight. Leave bandaid on for 48 hours unless is saturated (soaked through with blood). After 48 hours can get wet and soapy in shower, pat area dry after showering.  For about 5 days after cover with neosporin and bandaid. After that ok to leave to air. Take tylenol and ibuprofen for pain. Take your hydrocodone if needed for breakthrough.

## 2022-07-15 NOTE — Progress Notes (Signed)
[name] Procedure Note  07/15/22  Pre-procedure Diagnosis: Lipoma right lower posterior leg    Post-procedure Diagnosis: Same   Procedure(s) Performed: Excision of lipoma 4.5 cm   Surgeon: Leatrice Jewels. Henreitta Leber, MD   Assistants: No qualified resident was available    Anesthesia: Lidocaine 1%    Specimens: Lipoma   Estimated Blood Loss: Minimal  Wound Class: Clean    Procedure Indications: Phillip Rios is a 72 yo who has had a mass on his posterior leg for several years and he has noticed it growing and bothering him when he lays flat. We discussed excision under local and risk of bleeding, infection, recurrence or finding something unexpected.   Findings: Lipomatous mass 4.5cm   Procedure: The patient was taken to the procedure room and placed on his left side to expose the right posterior calf. The right calf was prepared and draped in the usual sterile fashion. Lidocaine 1% was injected. A vertical incision was made down the axis of the calf and carried down to the lipoma. With both sharp scissor dissection and blunt dissection the lipoma was removed, total size 4.5cm. Hemostasis was achieved. The wound was irrigated. 3-0 Vicryl interrupted sutures closed the dermis and the skin was closed with interrupted 3-0 Nylon. Neosporin and Bandaid and pressure dressing were placed.   Final inspection revealed acceptable hemostasis. The patient tolerated the procedure well.   Future Appointments  Date Time Provider Department Center  07/28/2022  1:45 PM Lucretia Roers, MD [facility] None   Leave pressure dressing in place for 24 hours. Remove if feeling too tight. Leave bandaid on for 48 hours unless is saturated (soaked through with blood). After 48 hours can get wet and soapy in shower, pat area dry after showering.  For about 5 days after cover with neosporin and bandaid. After that ok to leave to air. Take tylenol and ibuprofen for pain. Take your hydrocodone if  needed for breakthrough.   Algis Greenhouse, MD [facility] [address] [place], [place] [address] [phone] (office)

## 2022-07-20 LAB — PATHOLOGY REPORT

## 2022-07-20 LAB — TISSUE SPECIMEN

## 2022-07-28 ENCOUNTER — Ambulatory Visit (INDEPENDENT_AMBULATORY_CARE_PROVIDER_SITE_OTHER): Payer: HMO | Admitting: General Surgery

## 2022-07-28 ENCOUNTER — Encounter: Payer: Self-pay | Admitting: General Surgery

## 2022-07-28 VITALS — BP 113/73 | HR 87 | Temp 98.0°F | Resp 18 | Ht 73.0 in | Wt 298.0 lb

## 2022-07-28 DIAGNOSIS — D1723 Benign lipomatous neoplasm of skin and subcutaneous tissue of right leg: Secondary | ICD-10-CM

## 2022-07-28 DIAGNOSIS — T148XXA Other injury of unspecified body region, initial encounter: Secondary | ICD-10-CM

## 2022-07-28 NOTE — Progress Notes (Signed)
[name]  Patient kicked his leg last night. No issues or drainage reported.  BP 113/73   Pulse 87   Temp 98 F (36.7 C) (Oral)   Resp 18   Ht 6\' 1"  (1.854 m)   Wt 298 lb (135.2 kg)   SpO2 93%   BMI 39.32 kg/m  Sutures intact, minor redness around the incision, sutures removed and area central with some signs of trying to drain, probed and old hematoma evacuated  Steristrip placed and 1/4 inch packing placed into the hematoma, gauze and Coband placed  Patient s/p lipoma excision. Pathology as expected.  Leave bandage in place for 48 hours. Will remove in clinic.  Left a little packing in area to try to get the old blood to drain out without causing the wound to open up further.   If the wound opens up will have to pack it and heal from the inside out.   Future Appointments  Date Time Provider Department Center  07/30/2022  9:00 AM Lucretia Roers, MD [facility] None   Algis Greenhouse, MD [facility] [address] [place] [place], [place] [address] [phone] (office)

## 2022-07-28 NOTE — Patient Instructions (Addendum)
Leave bandage in place for 48 hours. Will remove in clinic.  Left a little packing in area to try to get the old blood to drain out without causing the wound to open up further.

## 2022-07-30 ENCOUNTER — Encounter: Payer: Self-pay | Admitting: General Surgery

## 2022-07-30 ENCOUNTER — Ambulatory Visit (INDEPENDENT_AMBULATORY_CARE_PROVIDER_SITE_OTHER): Payer: HMO | Admitting: General Surgery

## 2022-07-30 VITALS — BP 117/79 | HR 72 | Temp 98.0°F | Resp 16 | Ht 73.0 in | Wt 297.0 lb

## 2022-07-30 DIAGNOSIS — T148XXA Other injury of unspecified body region, initial encounter: Secondary | ICD-10-CM

## 2022-07-30 DIAGNOSIS — D1723 Benign lipomatous neoplasm of skin and subcutaneous tissue of right leg: Secondary | ICD-10-CM

## 2022-07-30 NOTE — Patient Instructions (Signed)
Monitor area and call if any issues with redness spreading out. Strips can stay on for a few days.  Can remove once peeling up. Ok to shower.

## 2022-07-30 NOTE — Progress Notes (Signed)
[facility]  Doing ok. Dressing got wet and he replaced it. Minimal drainage on dressing. No redness around the wound.  BP 117/79   Pulse 72   Temp 98 F (36.7 C) (Oral)   Resp 16   Ht 6\' 1"  (1.854 m)   Wt 297 lb (134.7 kg)   SpO2 94%   BMI 39.18 kg/m   No redness, steri strips in place Placed steri strip over the area where drainage had been as this has closed up. No signs of infection.  Cannot express any more blood and the prior steri strips remain in place.   Patient s/p lipoma excision with hematoma evacuation at the time of suture removal.   Monitor area and call if any issues with redness spreading out. Strips can stay on for a few days.  Can remove once peeling up. Ok to shower.   Algis Greenhouse, MD [facility] [address] [place], [place] [address] [phone] (office)

## 2022-08-11 DIAGNOSIS — I10 Essential (primary) hypertension: Secondary | ICD-10-CM | POA: Diagnosis not present

## 2022-08-11 DIAGNOSIS — G4733 Obstructive sleep apnea (adult) (pediatric): Secondary | ICD-10-CM | POA: Diagnosis not present

## 2022-08-11 DIAGNOSIS — M1710 Unilateral primary osteoarthritis, unspecified knee: Secondary | ICD-10-CM | POA: Diagnosis not present

## 2022-08-11 DIAGNOSIS — I700 Atherosclerosis of aorta: Secondary | ICD-10-CM | POA: Diagnosis not present

## 2022-08-11 DIAGNOSIS — Z299 Encounter for prophylactic measures, unspecified: Secondary | ICD-10-CM | POA: Diagnosis not present

## 2022-08-11 DIAGNOSIS — I480 Paroxysmal atrial fibrillation: Secondary | ICD-10-CM | POA: Diagnosis not present

## 2022-09-06 DIAGNOSIS — I10 Essential (primary) hypertension: Secondary | ICD-10-CM | POA: Diagnosis not present

## 2022-09-06 DIAGNOSIS — Z7189 Other specified counseling: Secondary | ICD-10-CM | POA: Diagnosis not present

## 2022-09-06 DIAGNOSIS — Z299 Encounter for prophylactic measures, unspecified: Secondary | ICD-10-CM | POA: Diagnosis not present

## 2022-09-06 DIAGNOSIS — Z1331 Encounter for screening for depression: Secondary | ICD-10-CM | POA: Diagnosis not present

## 2022-09-06 DIAGNOSIS — Z1339 Encounter for screening examination for other mental health and behavioral disorders: Secondary | ICD-10-CM | POA: Diagnosis not present

## 2022-09-06 DIAGNOSIS — Z6841 Body Mass Index (BMI) 40.0 and over, adult: Secondary | ICD-10-CM | POA: Diagnosis not present

## 2022-09-06 DIAGNOSIS — Z0000 Encounter for general adult medical examination without abnormal findings: Secondary | ICD-10-CM | POA: Diagnosis not present

## 2022-09-07 ENCOUNTER — Encounter: Payer: Self-pay | Admitting: *Deleted

## 2022-09-22 ENCOUNTER — Telehealth: Payer: Self-pay | Admitting: *Deleted

## 2022-09-22 DIAGNOSIS — M25562 Pain in left knee: Secondary | ICD-10-CM | POA: Diagnosis not present

## 2022-09-22 DIAGNOSIS — M1711 Unilateral primary osteoarthritis, right knee: Secondary | ICD-10-CM | POA: Diagnosis not present

## 2022-09-22 NOTE — Telephone Encounter (Signed)
Left message to call back to schedule In office appt. Per Dr. Wyline Mood last ov notes pt was to f/u 6 months later but did not. Pt now needs pre op clearance and in office appt. I will send a message to [facility] scheduling as well.

## 2022-09-22 NOTE — Telephone Encounter (Signed)
   Pre-operative Risk Assessment    Patient Name: Phillip Rios  DOB: 1950-07-01 MRN: [id number]    DATE OF LAST VISIT: 12/15/21 DR. JONATHAN BRANCH DATE OF NEXT VISIT: NONE  Request for Surgical Clearance    Procedure:   RIGHT TOTAL KNEE ARTHROPLASTY  Date of Surgery:  Clearance TBD                                 Surgeon:  DR. Malon Kindle  Surgeon's Group or Practice Name:  [facility] Phone number:  [phone] ATTN: KERRI MAZE Fax number:  [phone]   Type of Clearance Requested:   - Medical ; ASA   Type of Anesthesia:  Spinal   Additional requests/questions:    Elpidio Anis   09/22/2022, 4:51 PM

## 2022-09-22 NOTE — Telephone Encounter (Signed)
   Name: Phillip Rios  DOB: [birth year]  MRN: [id number]  Primary Cardiologist: Dina Rich, MD  Chart reviewed as part of pre-operative protocol coverage. Because of Phillip Rios's past medical history and time since last visit, he will require a follow-up in-office visit in order to better assess preoperative cardiovascular risk.  Pre-op covering staff: - Please schedule appointment and call patient to inform them. If patient already had an upcoming appointment within acceptable timeframe, please add "pre-op clearance" to the appointment notes so provider is aware. - Please contact requesting surgeon's office via preferred method (i.e, phone, fax) to inform them of need for appointment prior to surgery.   Joylene Grapes, NP  09/22/2022, 4:58 PM

## 2022-09-23 ENCOUNTER — Encounter: Payer: Self-pay | Admitting: Nurse Practitioner

## 2022-09-23 ENCOUNTER — Ambulatory Visit: Payer: HMO | Attending: Physician Assistant | Admitting: Nurse Practitioner

## 2022-09-23 VITALS — BP 120/70 | HR 109 | Ht 73.0 in | Wt 293.0 lb

## 2022-09-23 DIAGNOSIS — I471 Supraventricular tachycardia, unspecified: Secondary | ICD-10-CM

## 2022-09-23 DIAGNOSIS — I10 Essential (primary) hypertension: Secondary | ICD-10-CM

## 2022-09-23 DIAGNOSIS — Z01810 Encounter for preprocedural cardiovascular examination: Secondary | ICD-10-CM | POA: Diagnosis not present

## 2022-09-23 DIAGNOSIS — I2510 Atherosclerotic heart disease of native coronary artery without angina pectoris: Secondary | ICD-10-CM

## 2022-09-23 MED ORDER — NITROGLYCERIN 0.4 MG SL SUBL: 0.4000 mg | SUBLINGUAL_TABLET | SUBLINGUAL | 3 refills | Status: DC | PRN

## 2022-09-23 NOTE — Progress Notes (Addendum)
Cardiology Office Note:  .   Date:  09/23/2022 ID:  Larey, Hertel 1950/06/27, MRN [id number] PCP: Kirstie Peri, MD  [facility] Providers Cardiologist:  Dina Rich, MD    History of Present Illness: .   Phillip Rios is a 72 y.o. male with a PMH of chest pain/ CAD, HTN, OSA on CPAP, PSVT, dyslipidemia, hx of prostate cancer (completed radiation in 2019), GERD, CKD, and arthritis, who presents today for pre-operative cardiovascular risk assessment.   NST in 2019 ordered by PCP, completed at [facility], showed areas of reversibility along lateral wall, apex, and inferior wall that were all completely reversible. Cath in 2019 showed 80% pLAD lesion, s/p DES.  Last seen by Dr. Dina Rich on December 15, 2021. Dr. Wyline Mood noted that patient's orthostatic dizziness improved with stopping hydrochlorothiazide and patient noted mild ongoing dizziness. Dr. Wyline Mood recommended/suggested may need to reduce BP meds further pending symptoms. Patient also noted chronic atypical chest pains. Shortness of breath was felt to be d/t sedentary lifestyle and 40 lb weight gain over past several years.   Today he presents for pre-operative cardiovascular risk assessment. He is pending right total knee arthrotplasty. Surgeon will be Dr. Malon Kindle or [facility] and surgery date is TBD. He states he is doing well. Denies any anginal chest pain, shortness of breath, palpitations, syncope, presyncope, dizziness, orthopnea, PND, swelling or significant weight changes, acute bleeding, or claudication. Does admit to recent MSK soreness along his chest, was resolved quickly and attributes this to a strained muscle. Says he was picking up a grill with his grandson at the time. Denies any recurrence.   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 Former Games developer. Has 4 grandsons and 8 great grandchildren, ages range from 36 to a grandchild to be born  this November.   ROS: See HPI. Rest of ROS Negative.  Studies Reviewed: Marland Kitchen    EKG: EKG Interpretation Date/Time:  Thursday September 23 2022 14:33:17 EDT Ventricular Rate:  81 PR Interval:  182 QRS Duration:  92 QT Interval:  394 QTC Calculation: 457 R Axis:   245  Text Interpretation: Normal sinus rhythm Incomplete right bundle branch block Possible Right ventricular hypertrophy Inferior infarct , age undetermined Anterolateral infarct (cited on or before 06-Nov-2018) When compared with ECG of 06-Nov-2018 09:48, Incomplete right bundle branch block is now Present Questionable change in initial forces of Lateral leads Confirmed by Sharlene Dory [phone]) on 09/26/2022 4:28:23 PM   Echo 03/2020:  1. Left ventricular ejection fraction, by estimation, is 60 to 65%. The  left ventricle has normal function. Left ventricular endocardial border  not optimally defined to evaluate regional wall motion. There is mild left  ventricular hypertrophy. Left  ventricular diastolic parameters are indeterminate.   2. Right ventricular systolic function is normal. The right ventricular  size is normal.   3. The mitral valve is normal in structure. No evidence of mitral valve  regurgitation. No evidence of mitral stenosis.   4. The aortic valve was not well visualized. Aortic valve regurgitation  is not visualized. No aortic stenosis is present.   5. Aortic dilatation noted. There is mild dilatation of the ascending  aorta, measuring 40 mm.   Comparison(s): Previous Echo was suboptimal, LV EF estimated at 65-70%,  Grade I diastolic dysfunction.  LHC 08/2017:  Prox LAD lesion is 80% stenosed. A drug-eluting stent was successfully placed using a STENT SIERRA 2.75  X 12 MM. Post intervention, there is a 0% residual stenosis. The left ventricular systolic function is normal. LV end diastolic pressure is normal. The left ventricular ejection fraction is 55-65% by visual estimate. There is no mitral valve  regurgitation.   1. Severe stenosis mid LAD. The stenosis was eccentric and in some views did not appear to be severe. FFR 0.8 in the mid LAD suggesting the stenosis was likely flow limiting. Given the abnormal FFR and the abnormal nuclear stress test, PCI was performed.  2. Successful PTCA/DES x 1 mid LAD 3. Normal LV systolic function   Recommendations: Will start a statin.  Recommend uninterrupted dual antiplatelet therapy with Aspirin 81mg  daily and Clopidogrel 75mg  daily for a minimum of 12 months (ACS - Class I recommendation).  NST 08/2017:  Several areas of reversibility are noted involving the apex, lateral wall, and inferior wall. No fixed defect identified to suggest infarct.  Normal left ventricular wall motion.  LVEF: 61% 4. Non invasive risk stratification: Intermediate  Cardiac monitor 04/2016: See scanned result under tab "CV Proc"  Physical Exam:   VS:  BP 120/70   Pulse (!) 109   Ht 6\' 1"  (1.854 m)   Wt 293 lb (132.9 kg)   SpO2 96%   BMI 38.66 kg/m    Wt Readings from Last 3 Encounters:  09/23/22 293 lb (132.9 kg)  07/30/22 297 lb (134.7 kg)  07/28/22 298 lb (135.2 kg)    GEN: Obese, 72 y.o. male in no acute distress NECK: No JVD; No carotid bruits CARDIAC: S1/S2, RRR, no murmurs, rubs, gallops RESPIRATORY:  Clear to auscultation without rales, wheezing or rhonchi  ABDOMEN: Soft, non-tender, non-distended EXTREMITIES:  No edema; No deformity   ASSESSMENT AND PLAN: .    Pre-operative cardiovascular risk assessment Mr. Basquez's perioperative risk of a major cardiac event is 0.9% according to the Revised Cardiac Risk Index (RCRI).  Therefore, he is at low risk for perioperative complications.   His functional capacity is excellent at 6.61 METs according to the Duke Activity Status Index (DASI). Recommendations: According to ACC/AHA guidelines, no further cardiovascular testing needed.  The patient may proceed to surgery at acceptable risk.   Antiplatelet and/or  Anticoagulation Recommendations: Aspirin can be held for 7 days prior to his surgery.  Please resume Aspirin post operatively when it is felt to be safe from a bleeding standpoint. Patient is not on anticoagulation therapy that needs to be held prior to procedure. Will route note to requesting party.   2. CAD Cath in 2019 showed 80% pLAD lesion, s/p DES. Stable with no anginal symptoms. Recent episode of moving grill with grandson sounds MSK related. No indication for ischemic evaluation. Continue Aspirin, Atorvastatin, Lisinopril, Toprol XL, and will refill NTG. Heart healthy diet and regular cardiovascular exercise encouraged.   3. HTN BP stable. Discussed to monitor BP at home at least 2 hours after medications and sitting for 5-10 minutes. No medication changes at this time. Heart healthy diet and regular cardiovascular exercise encouraged.   4. "A-fib?", PSVT Denies any tachycardia or palpitations. HR 80's on exam. Continue Toprol XL. Chart states he has had a previous hx of A-fib. However, chart is limited and I do not see where this was captured on EKG or study. Most recent monitor report scanned did not detect A-fib. Detected some runs of SVT, longest lasting around 10 beats. Pt is in SR today. Continue Toprol XL. Heart healthy diet and regular cardiovascular exercise encouraged.    Dispo:  Most recent labs reviewed with patient. Recommended to follow-up with PCP and forward upcoming labs with PCP to our office - he verbalized understanding. Follow-up with Dr. Dina Rich or APP in 1 year or sooner if anything changes.   Signed, Sharlene Dory, NP

## 2022-09-23 NOTE — Telephone Encounter (Signed)
Pt has appt today with Sharlene Dory, NP for preop clearance. I will update all parties involved.

## 2022-09-23 NOTE — Patient Instructions (Addendum)
Medication Instructions:  Your physician recommends that you continue on your current medications as directed. Please refer to the Current Medication list given to you today.  Labwork: None  Testing/Procedures: None  Follow-Up: Your physician recommends that you schedule a follow-up appointment in:1 Year Dr.Branch    Any Other Special Instructions Will Be Listed Below (If Applicable).  If you need a refill on your cardiac medications before your next appointment, please call your pharmacy.

## 2022-09-24 DIAGNOSIS — Z299 Encounter for prophylactic measures, unspecified: Secondary | ICD-10-CM | POA: Diagnosis not present

## 2022-09-24 DIAGNOSIS — M25561 Pain in right knee: Secondary | ICD-10-CM | POA: Diagnosis not present

## 2022-09-24 DIAGNOSIS — I10 Essential (primary) hypertension: Secondary | ICD-10-CM | POA: Diagnosis not present

## 2022-09-24 DIAGNOSIS — M25562 Pain in left knee: Secondary | ICD-10-CM | POA: Diagnosis not present

## 2022-10-26 NOTE — H&P (Signed)
Patient's anticipated LOS is less than 2 midnights, meeting these requirements: - Younger than 11 - Lives within 1 hour of care - Has a competent adult at home to recover with post-op recover - NO history of  - Chronic pain requiring opiods  - Diabetes  - Coronary Artery Disease  - Heart failure  - Heart attack  - Stroke  - DVT/VTE  - Cardiac arrhythmia  - Respiratory Failure/COPD  - Renal failure  - Anemia  - Advanced Liver disease     Phillip Rios is an 72 y.o. male.    Chief Complaint: right knee pain  HPI: Pt is a 72 y.o. male complaining of right knee pain for multiple years. Pain had continually increased since the beginning. X-rays in the clinic show end-stage arthritic changes of the right knee. Pt has tried various conservative treatments which have failed to alleviate their symptoms, including injections and therapy. Various options are discussed with the patient. Risks, benefits and expectations were discussed with the patient. Patient understand the risks, benefits and expectations and wishes to proceed with surgery.   PCP:  Phillip Peri, MD  D/C Plans: Home  PMH: Past Medical History:  Diagnosis Date   Anal fistula    Arthritis    both knees   Atrial fibrillation (HCC) 2018   short episode of afib, noted on event monitor   Coronary artery disease    non obstructive   Dyslipidemia    GERD (gastroesophageal reflux disease)    Hypertension    Morbid obesity (HCC)    OSA on CPAP    does not use cpap, could not get cpap   Prostate cancer (HCC)    Stage IIC , prostate completed radiation 06/08/17   PSVT (paroxysmal supraventricular tachycardia) 2018    noted on event monitor    PSH: Past Surgical History:  Procedure Laterality Date   ABDOMINAL SURGERY     Stomach surgery for obstruction as a child   CARDIAC CATHETERIZATION     cath   COLONOSCOPY     COLONOSCOPY N/A 12/21/2018   Procedure: COLONOSCOPY;  Surgeon: Malissa Hippo, MD;  Location:  [facility];  Service: Endoscopy;  Laterality: N/A;  100   CORONARY PRESSURE/FFR STUDY N/A 09/01/2017   Procedure: INTRAVASCULAR PRESSURE WIRE/FFR STUDY;  Surgeon: Kathleene Hazel, MD;  Location: [facility] INVASIVE CV LAB;  Service: Cardiovascular;  Laterality: N/A;   CORONARY STENT INTERVENTION  09/01/2017   CORONARY STENT INTERVENTION N/A 09/01/2017   Procedure: CORONARY STENT INTERVENTION;  Surgeon: Kathleene Hazel, MD;  Location: [facility] INVASIVE CV LAB;  Service: Cardiovascular;  Laterality: N/A;   EVALUATION UNDER ANESTHESIA WITH ANAL FISSUROTOMY N/A 06/15/2019   Procedure: ANAL EXAM UNDER ANESTHESIA, LIGATION OF INTERNAL FISTULA TRACT, FISTULOTOMY;  Surgeon: Romie Levee, MD;  Location: [facility];  Service: General;  Laterality: N/A;   LEFT HEART CATH AND CORONARY ANGIOGRAPHY N/A 09/01/2017   Procedure: LEFT HEART CATH AND CORONARY ANGIOGRAPHY;  Surgeon: Kathleene Hazel, MD;  Location: [facility] INVASIVE CV LAB;  Service: Cardiovascular;  Laterality: N/A;   LIGATION OF INTERNAL FISTULA TRACT N/A 03/29/2019   Procedure: LIGATION OF ANTERIOR FISTULA TRACT;  Surgeon: Romie Levee, MD;  Location: [facility];  Service: General;  Laterality: N/A;   PLACEMENT OF SETON  11/08/2018   Procedure: PLACEMENT OF SETON X 4 IN TRANSPHINCTERIC FISTULA;  Surgeon: Lucretia Roers, MD;  Location: [facility];  Service: General;;    Social History:  reports that  he has never smoked. His smokeless tobacco use includes chew. He reports that he does not drink alcohol and does not use drugs. BMI: Estimated body mass index is 38.66 kg/m as calculated from the following:   Height as of 09/23/22: 6\' 1"  (1.854 m).   Weight as of 09/23/22: 132.9 kg.  No results found for: "ALBUMIN" Diabetes: Patient does not have a diagnosis of diabetes.     Smoking Status:   reports that he has never smoked. His smokeless tobacco use includes chew.    Allergies:  No Known  Allergies  Medications: No current facility-administered medications for this encounter.   Current Outpatient Medications  Medication Sig Dispense Refill   acetaminophen (TYLENOL) 500 MG tablet Take 500 mg by mouth every 6 (six) hours as needed for moderate pain or headache.     aspirin EC 81 MG tablet Take 81 mg by mouth daily.     atorvastatin (LIPITOR) 80 MG tablet Take 1 tablet (80 mg total) by mouth daily at 6 PM. 90 tablet 3   docusate sodium (COLACE) 100 MG capsule Take 200 mg by mouth daily.     hydrochlorothiazide (HYDRODIURIL) 25 MG tablet Take 25 mg by mouth daily.     HYDROcodone-acetaminophen (NORCO/VICODIN) 5-325 MG tablet Take 1 tablet by mouth 2 (two) times daily.     lisinopril (PRINIVIL,ZESTRIL) 20 MG tablet Take 20 mg by mouth daily.     metoprolol succinate (TOPROL-XL) 50 MG 24 hr tablet Take 25 mg by mouth daily.     nitroGLYCERIN (NITROSTAT) 0.4 MG SL tablet Place 1 tablet (0.4 mg total) under the tongue every 5 (five) minutes as needed for chest pain. 25 tablet 3   pantoprazole (PROTONIX) 40 MG tablet Take 40 mg by mouth daily.  2    No results found for this or any previous visit (from the past 48 hour(s)). No results found.  ROS: Pain with rom of the right lower extremity  Physical Exam: Alert and oriented 72 y.o. male in no acute distress Cranial nerves 2-12 intact Cervical spine: full rom with no tenderness, nv intact distally Chest: active breath sounds bilaterally, no wheeze rhonchi or rales Heart: regular rate and rhythm, no murmur Abd: non tender non distended with active bowel sounds Hip is stable with rom  Right knee painful rom with crepitus Nv intact distally No rashes or edema Antalgic gait  Assessment/Plan Assessment: right knee end stage osteoarthritis  Plan:  Patient will undergo a right total knee by Dr. Ranell Patrick at [facility] Risks benefits and expectations were discussed with the patient. Patient understand risks, benefits and  expectations and wishes to proceed. Preoperative templating of the joint replacement has been completed, documented, and submitted to the Operating Room personnel in order to optimize intra-operative equipment management.   Alphonsa Overall PA-C, MPAS [facility] is now [employer] [address], [place], [place] [address] Phone: [phone] [email or web address] Facebook  [employer]

## 2022-11-01 NOTE — Progress Notes (Signed)
Anesthesia Review:  PCP: DR Lanier Clam- clearance 09/24/22 on chart by Tresa Res, NP L OV 09/24/22 on chart  Cardiologist : DR Gwynneth Albright Peck,NP preop card exan 09/23/22 on chart Clearance in note.  Chest x-ray : EKG : 09/23/22  Echo : 2022  Stress test: 2019  Cardiac Cath  2019   Activity level: can do a flight of stairs without diffictulty  Sleep Study/ CPAP : has cpap  Fasting Blood Sugar :      / Checks Blood Sugar -- times a day:   Blood Thinner/ Instructions /Last Dose: ASA / Instructions/ Last Dose :    81 mg aspirin    BMP done 11/03/22 with potassium of 3.0 routed to DR Ranell Patrick.   Jacobo Forest aware.

## 2022-11-01 NOTE — Patient Instructions (Signed)
SURGICAL WAITING ROOM VISITATION  Patients having surgery or a procedure may have no more than 2 support people in the waiting area - these visitors may rotate.    Children under the age of 5 must have an adult with them who is not the patient.  Due to an increase in RSV and influenza rates and associated hospitalizations, children ages 66 and under may not visit patients in [facility] hospitals.  If the patient needs to stay at the hospital during part of their recovery, the visitor guidelines for inpatient rooms apply. Pre-op nurse will coordinate an appropriate time for 1 support person to accompany patient in pre-op.  This support person may not rotate.    Please refer to the [facility] website for the visitor guidelines for Inpatients (after your surgery is over and you are in a regular room).       Your procedure is scheduled on:  11/12/22    Report to [facility] Main Entrance    Report to admitting at  12230 pm    Call this number if you have problems the morning of surgery [phone]   Do not eat food :After Midnight.   After Midnight you may have the following liquids until _ 1200 noon __ DAY OF SURGERY  Water Non-Citrus Juices (without pulp, NO RED-Apple, White grape, White cranberry) Black Coffee (NO MILK/CREAM OR CREAMERS, sugar ok)  Clear Tea (NO MILK/CREAM OR CREAMERS, sugar ok) regular and decaf                             Plain Jell-O (NO RED)                                           Fruit ices (not with fruit pulp, NO RED)                                     Popsicles (NO RED)                                                               Sports drinks like Gatorade (NO RED)                    The day of surgery:  Drink ONE (1) Pre-Surgery Clear Ensure or G2 at  1200 noon  the morning of surgery. Drink in one sitting. Do not sip.  This drink was given to you during your hospital  pre-op appointment visit. Nothing else to drink after  completing the  Pre-Surgery Clear Ensure or G2.          If you have questions, please contact your surgeon's office.       Oral Hygiene is also important to reduce your risk of infection.                                    Remember - BRUSH YOUR TEETH THE MORNING OF SURGERY WITH YOUR REGULAR TOOTHPASTE  DENTURES WILL BE REMOVED PRIOR TO SURGERY PLEASE DO NOT APPLY "Poly grip" OR ADHESIVES!!!   Do NOT smoke after Midnight   Stop all vitamins and herbal supplements 7 days before surgery.   Take these medicines the morning of surgery with A SIP OF WATER:  toprol, protonix   DO NOT TAKE ANY ORAL DIABETIC MEDICATIONS DAY OF YOUR SURGERY  Bring CPAP mask and tubing day of surgery.                              You may not have any metal on your body including hair pins, jewelry, and body piercing             Do not wear make-up, lotions, powders, perfumes/cologne, or deodorant  Do not wear nail polish including gel and S&S, artificial/acrylic nails, or any other type of covering on natural nails including finger and toenails. If you have artificial nails, gel coating, etc. that needs to be removed by a nail salon please have this removed prior to surgery or surgery may need to be canceled/ delayed if the surgeon/ anesthesia feels like they are unable to be safely monitored.   Do not shave  48 hours prior to surgery.               Men may shave face and neck.   Do not bring valuables to the hospital. [facility] IS NOT             RESPONSIBLE   FOR VALUABLES.   Contacts, glasses, dentures or bridgework may not be worn into surgery.   Bring small overnight bag day of surgery.   DO NOT BRING YOUR HOME MEDICATIONS TO THE HOSPITAL. PHARMACY WILL DISPENSE MEDICATIONS LISTED ON YOUR MEDICATION LIST TO YOU DURING YOUR ADMISSION IN THE HOSPITAL!    Patients discharged on the day of surgery will not be allowed to drive home.  Someone NEEDS to stay with you for the first 24 hours after  anesthesia.   Special Instructions: Bring a copy of your healthcare power of attorney and living will documents the day of surgery if you haven't scanned them before.              Please read over the following fact sheets you were given: IF YOU HAVE QUESTIONS ABOUT YOUR PRE-OP INSTRUCTIONS PLEASE CALL [phone]   If you received a COVID test during your pre-op visit  it is requested that you wear a mask when out in public, stay away from anyone that may not be feeling well and notify your surgeon if you develop symptoms. If you test positive for Covid or have been in contact with anyone that has tested positive in the last 10 days please notify you surgeon.      Pre-operative 5 CHG Bath Instructions   You can play a key role in reducing the risk of infection after surgery. Your skin needs to be as free of germs as possible. You can reduce the number of germs on your skin by washing with CHG (chlorhexidine gluconate) soap before surgery. CHG is an antiseptic soap that kills germs and continues to kill germs even after washing.   DO NOT use if you have an allergy to chlorhexidine/CHG or antibacterial soaps. If your skin becomes reddened or irritated, stop using the CHG and notify one of our RNs at [phone].   Please shower with the CHG soap starting 4 days before  surgery using the following schedule:     Please keep in mind the following:  DO NOT shave, including legs and underarms, starting the day of your first shower.   You may shave your face at any point before/day of surgery.  Place clean sheets on your bed the day you start using CHG soap. Use a clean washcloth (not used since being washed) for each shower. DO NOT sleep with pets once you start using the CHG.   CHG Shower Instructions:  If you choose to wash your hair and private area, wash first with your normal shampoo/soap.  After you use shampoo/soap, rinse your hair and body thoroughly to remove shampoo/soap residue.   Turn the water OFF and apply about 3 tablespoons (45 ml) of CHG soap to a CLEAN washcloth.  Apply CHG soap ONLY FROM YOUR NECK DOWN TO YOUR TOES (washing for 3-5 minutes)  DO NOT use CHG soap on face, private areas, open wounds, or sores.  Pay special attention to the area where your surgery is being performed.  If you are having back surgery, having someone wash your back for you may be helpful. Wait 2 minutes after CHG soap is applied, then you may rinse off the CHG soap.  Pat dry with a clean towel  Put on clean clothes/pajamas   If you choose to wear lotion, please use ONLY the CHG-compatible lotions on the back of this paper.     Additional instructions for the day of surgery: DO NOT APPLY any lotions, deodorants, cologne, or perfumes.   Put on clean/comfortable clothes.  Brush your teeth.  Ask your nurse before applying any prescription medications to the skin.      CHG Compatible Lotions   Aveeno Moisturizing lotion  Cetaphil Moisturizing Cream  Cetaphil Moisturizing Lotion  Clairol Herbal Essence Moisturizing Lotion, Dry Skin  Clairol Herbal Essence Moisturizing Lotion, Extra Dry Skin  Clairol Herbal Essence Moisturizing Lotion, Normal Skin  Curel Age Defying Therapeutic Moisturizing Lotion with Alpha Hydroxy  Curel Extreme Care Body Lotion  Curel Soothing Hands Moisturizing Hand Lotion  Curel Therapeutic Moisturizing Cream, Fragrance-Free  Curel Therapeutic Moisturizing Lotion, Fragrance-Free  Curel Therapeutic Moisturizing Lotion, Original Formula  Eucerin Daily Replenishing Lotion  Eucerin Dry Skin Therapy Plus Alpha Hydroxy Crme  Eucerin Dry Skin Therapy Plus Alpha Hydroxy Lotion  Eucerin Original Crme  Eucerin Original Lotion  Eucerin Plus Crme Eucerin Plus Lotion  Eucerin TriLipid Replenishing Lotion  Keri Anti-Bacterial Hand Lotion  Keri Deep Conditioning Original Lotion Dry Skin Formula Softly Scented  Keri Deep Conditioning Original Lotion, Fragrance  Free Sensitive Skin Formula  Keri Lotion Fast Absorbing Fragrance Free Sensitive Skin Formula  Keri Lotion Fast Absorbing Softly Scented Dry Skin Formula  Keri Original Lotion  Keri Skin Renewal Lotion Keri Silky Smooth Lotion  Keri Silky Smooth Sensitive Skin Lotion  Nivea Body Creamy Conditioning Oil  Nivea Body Extra Enriched Teacher, adult education Moisturizing Lotion Nivea Crme  Nivea Skin Firming Lotion  NutraDerm 30 Skin Lotion  NutraDerm Skin Lotion  NutraDerm Therapeutic Skin Cream  NutraDerm Therapeutic Skin Lotion  ProShield Protective Hand Cream  Provon moisturizing lotion

## 2022-11-03 ENCOUNTER — Other Ambulatory Visit: Payer: Self-pay

## 2022-11-03 ENCOUNTER — Encounter (HOSPITAL_COMMUNITY)
Admission: RE | Admit: 2022-11-03 | Discharge: 2022-11-03 | Disposition: A | Discharge status: Home / Self Care | Payer: HMO | Source: Ambulatory Visit | Attending: Orthopedic Surgery | Admitting: Orthopedic Surgery

## 2022-11-03 ENCOUNTER — Encounter (HOSPITAL_COMMUNITY): Payer: Self-pay

## 2022-11-03 VITALS — BP 107/94 | HR 66 | Temp 98.2°F | Resp 16 | Ht 73.0 in | Wt 286.0 lb

## 2022-11-03 DIAGNOSIS — I10 Essential (primary) hypertension: Secondary | ICD-10-CM | POA: Insufficient documentation

## 2022-11-03 DIAGNOSIS — Z8546 Personal history of malignant neoplasm of prostate: Secondary | ICD-10-CM | POA: Insufficient documentation

## 2022-11-03 DIAGNOSIS — I2510 Atherosclerotic heart disease of native coronary artery without angina pectoris: Secondary | ICD-10-CM | POA: Diagnosis not present

## 2022-11-03 DIAGNOSIS — M1711 Unilateral primary osteoarthritis, right knee: Secondary | ICD-10-CM | POA: Diagnosis not present

## 2022-11-03 DIAGNOSIS — G4733 Obstructive sleep apnea (adult) (pediatric): Secondary | ICD-10-CM | POA: Diagnosis not present

## 2022-11-03 DIAGNOSIS — Z01818 Encounter for other preprocedural examination: Secondary | ICD-10-CM

## 2022-11-03 DIAGNOSIS — Z01812 Encounter for preprocedural laboratory examination: Secondary | ICD-10-CM | POA: Diagnosis not present

## 2022-11-03 LAB — BASIC METABOLIC PANEL
Anion gap: 12 (ref 5–15)
BUN: 20 mg/dL (ref 8–23)
CO2: 25 mmol/L (ref 22–32)
Calcium: 9.3 mg/dL (ref 8.9–10.3)
Chloride: 100 mmol/L (ref 98–111)
Creatinine, Ser: 1.3 mg/dL — ABNORMAL HIGH (ref 0.61–1.24)
GFR, Estimated: 59 mL/min — ABNORMAL LOW (ref >60)
Glucose, Bld: 104 mg/dL — ABNORMAL HIGH (ref 70–99)
Potassium: 3 mmol/L — ABNORMAL LOW (ref 3.5–5.1)
Sodium: 137 mmol/L (ref 135–145)

## 2022-11-03 LAB — CBC
HCT: 45.2 % (ref 39.0–52.0)
Hemoglobin: 15.4 g/dL (ref 13.0–17.0)
MCH: 29.3 pg (ref 26.0–34.0)
MCHC: 34.1 g/dL (ref 30.0–36.0)
MCV: 85.9 fL (ref 80.0–100.0)
Platelets: 183 10*3/uL (ref 150–400)
RBC: 5.26 MIL/uL (ref 4.22–5.81)
RDW: 13.2 % (ref 11.5–15.5)
WBC: 5.9 10*3/uL (ref 4.0–10.5)
nRBC: 0 % (ref 0.0–0.2)

## 2022-11-03 LAB — SURGICAL PCR SCREEN
MRSA, PCR: NEGATIVE
Staphylococcus aureus: NEGATIVE

## 2022-11-04 NOTE — Progress Notes (Signed)
Anesthesia Chart Review   Case: [id number] Date/Time: 11/12/22 1445   Procedure: TOTAL KNEE ARTHROPLASTY (Right: Knee) - general   Anesthesia type: Spinal   Pre-op diagnosis: Right knee osteoarthritis   Location: [facility]OM 06 / [facility]   Surgeons: Beverely Low, MD       DISCUSSION:71 y.o. never smoker with h/o HTN, OSA on CPAP, CAD s/p DES in 2019, prostate cancer, right knee OA scheduled for above procedure 11/12/2022 with Dr. Beverely Low.   Per cardiology preoperative evaluation 09/23/2022, "Mr. Parrilla's perioperative risk of a major cardiac event is 0.9% according to the Revised Cardiac Risk Index (RCRI).  Therefore, he is at low risk for perioperative complications.   His functional capacity is excellent at 6.61 METs according to the Duke Activity Status Index (DASI). Recommendations: According to ACC/AHA guidelines, no further cardiovascular testing needed.  The patient may proceed to surgery at acceptable risk.   Antiplatelet and/or Anticoagulation Recommendations: Aspirin can be held for 7 days prior to his surgery.  Please resume Aspirin post operatively when it is felt to be safe from a bleeding standpoint. Patient is not on anticoagulation therapy that needs to be held prior to procedure. Will route note to requesting party. "  VS: BP (!) 107/94   Pulse 66   Temp 36.8 C (Oral)   Resp 16   Ht 6\' 1"  (1.854 m)   Wt 129.7 kg   SpO2 95%   BMI 37.73 kg/m   PROVIDERS: Kirstie Peri, MD is PCP   Cardiologist : Dr. Wayne Sever  LABS: Labs reviewed: Acceptable for surgery. (all labs ordered are listed, but only abnormal results are displayed)  Labs Reviewed  BASIC METABOLIC PANEL - Abnormal; Notable for the following components:      Result Value   Potassium 3.0 (*)    Glucose, Bld 104 (*)    Creatinine, Ser 1.30 (*)    GFR, Estimated 59 (*)    All other components within normal limits  SURGICAL PCR SCREEN  CBC     IMAGES:   EKG:   CV: Echo 03/27/2020 1. Left  ventricular ejection fraction, by estimation, is 60 to 65%. The  left ventricle has normal function. Left ventricular endocardial border  not optimally defined to evaluate regional wall motion. There is mild left  ventricular hypertrophy. Left  ventricular diastolic parameters are indeterminate.   2. Right ventricular systolic function is normal. The right ventricular  size is normal.   3. The mitral valve is normal in structure. No evidence of mitral valve  regurgitation. No evidence of mitral stenosis.   4. The aortic valve was not well visualized. Aortic valve regurgitation  is not visualized. No aortic stenosis is present.   5. Aortic dilatation noted. There is mild dilatation of the ascending  aorta, measuring 40 mm.   Comparison(s): Previous Echo was suboptimal, LV EF estimated at 65-70%,  Grade I diastolic dysfunction.   Past Medical History:  Diagnosis Date   Anal fistula    Arthritis    both knees   Atrial fibrillation (HCC) 2018   short episode of afib, noted on event monitor   Coronary artery disease    coronoary stent   Dyslipidemia    GERD (gastroesophageal reflux disease)    Hypertension    Morbid obesity (HCC)    OSA on CPAP    cpap   Prostate cancer (HCC)    Stage IIC , prostate completed radiation 06/08/17   PSVT (paroxysmal supraventricular tachycardia) (HCC) 2018  noted on event monitor    Past Surgical History:  Procedure Laterality Date   ABDOMINAL SURGERY     Stomach surgery for obstruction as a child   CARDIAC CATHETERIZATION     cath   COLONOSCOPY     COLONOSCOPY N/A 12/21/2018   Procedure: COLONOSCOPY;  Surgeon: Malissa Hippo, MD;  [facility];  Service: Endoscopy;  Laterality: N/A;  100   CORONARY PRESSURE/FFR STUDY N/A 09/01/2017   Procedure: INTRAVASCULAR PRESSURE WIRE/FFR STUDY;  Surgeon: Kathleene Hazel, MD;  Location: [facility];  Service: Cardiovascular;  Laterality: N/A;   CORONARY STENT INTERVENTION   09/01/2017   CORONARY STENT INTERVENTION N/A 09/01/2017   Procedure: CORONARY STENT INTERVENTION;  Surgeon: Kathleene Hazel, MD;  Location: [facility];  Service: Cardiovascular;  Laterality: N/A;   EVALUATION UNDER ANESTHESIA WITH ANAL FISSUROTOMY N/A 06/15/2019   Procedure: ANAL EXAM UNDER ANESTHESIA, LIGATION OF INTERNAL FISTULA TRACT, FISTULOTOMY;  Surgeon: Romie Levee, MD;  Location: [facility];  Service: General;  Laterality: N/A;   LEFT HEART CATH AND CORONARY ANGIOGRAPHY N/A 09/01/2017   Procedure: LEFT HEART CATH AND CORONARY ANGIOGRAPHY;  Surgeon: Kathleene Hazel, MD;  Location: [facility];  Service: Cardiovascular;  Laterality: N/A;   LIGATION OF INTERNAL FISTULA TRACT N/A 03/29/2019   Procedure: LIGATION OF ANTERIOR FISTULA TRACT;  Surgeon: Romie Levee, MD;  Location: [facility] [place];  Service: General;  Laterality: N/A;   PLACEMENT OF SETON  11/08/2018   Procedure: PLACEMENT OF SETON X 4 IN TRANSPHINCTERIC FISTULA;  Surgeon: Lucretia Roers, MD;  Location: [facility];  Service: General;;    MEDICATIONS:  HYDROcodone-acetaminophen (NORCO/VICODIN) 5-325 MG tablet   acetaminophen (TYLENOL) 500 MG tablet   aspirin EC 81 MG tablet   atorvastatin (LIPITOR) 80 MG tablet   docusate sodium (COLACE) 100 MG capsule   hydrochlorothiazide (HYDRODIURIL) 25 MG tablet   HYDROcodone-acetaminophen (NORCO/VICODIN) 5-325 MG tablet   lisinopril (PRINIVIL,ZESTRIL) 20 MG tablet   metoprolol succinate (TOPROL-XL) 50 MG 24 hr tablet   nitroGLYCERIN (NITROSTAT) 0.4 MG SL tablet   pantoprazole (PROTONIX) 40 MG tablet   No current facility-administered medications for this encounter.     Jodell Cipro Ward, PA-C [facility] [phone]

## 2022-11-04 NOTE — Anesthesia Preprocedure Evaluation (Addendum)
Anesthesia Evaluation  Patient identified by MRN, date of birth, ID band Patient awake    Reviewed: Allergy & Precautions, NPO status , Patient's Chart, lab work & pertinent test results, reviewed documented beta blocker date and time   Airway Mallampati: III  TM Distance: >3 FB Neck ROM: Full    Dental  (+) Dental Advisory Given, Upper Dentures   Pulmonary sleep apnea and Continuous Positive Airway Pressure Ventilation    Pulmonary exam normal breath sounds clear to auscultation       Cardiovascular hypertension, Pt. on home beta blockers and Pt. on medications (-) angina + CAD and + Cardiac Stents  Normal cardiovascular exam Rhythm:Regular Rate:Normal     Neuro/Psych negative neurological ROS     GI/Hepatic Neg liver ROS,GERD  Medicated and Controlled,,  Endo/Other  Obesity   Renal/GU negative Renal ROS   Prostate cancer     Musculoskeletal  (+) Arthritis ,    Abdominal   Peds  Hematology negative hematology ROS (+)   Anesthesia Other Findings Day of surgery medications reviewed with the patient.  Reproductive/Obstetrics                             Anesthesia Physical Anesthesia Plan  ASA: 3  Anesthesia Plan: Spinal   Post-op Pain Management: Regional block* and Tylenol PO (pre-op)*   Induction: Intravenous  PONV Risk Score and Plan: 1 and TIVA, Midazolam, Dexamethasone and Ondansetron  Airway Management Planned: Natural Airway and Simple Face Mask  Additional Equipment:   Intra-op Plan:   Post-operative Plan:   Informed Consent: I have reviewed the patients History and Physical, chart, labs and discussed the procedure including the risks, benefits and alternatives for the proposed anesthesia with the patient or authorized representative who has indicated his/her understanding and acceptance.     Dental advisory given  Plan Discussed with: CRNA  Anesthesia Plan  Comments: (See PAT note 11/03/2022)       Anesthesia Quick Evaluation

## 2022-11-09 ENCOUNTER — Ambulatory Visit: Payer: HMO | Admitting: Physician Assistant

## 2022-11-12 ENCOUNTER — Observation Stay (HOSPITAL_COMMUNITY)
Admission: RE | Admit: 2022-11-12 | Discharge: 2022-11-13 | Disposition: A | Discharge status: Home / Self Care | Payer: HMO | Source: Ambulatory Visit | Attending: Orthopedic Surgery | Admitting: Orthopedic Surgery

## 2022-11-12 ENCOUNTER — Encounter (HOSPITAL_COMMUNITY): Payer: Self-pay | Admitting: Orthopedic Surgery

## 2022-11-12 ENCOUNTER — Encounter (HOSPITAL_COMMUNITY)
Admission: RE | Disposition: A | Discharge status: Home / Self Care | Payer: Self-pay | Source: Ambulatory Visit | Attending: Orthopedic Surgery

## 2022-11-12 ENCOUNTER — Ambulatory Visit (HOSPITAL_COMMUNITY): Payer: HMO | Admitting: Physician Assistant

## 2022-11-12 ENCOUNTER — Ambulatory Visit (HOSPITAL_COMMUNITY): Payer: HMO | Admitting: Anesthesiology

## 2022-11-12 ENCOUNTER — Other Ambulatory Visit: Payer: Self-pay

## 2022-11-12 DIAGNOSIS — I4891 Unspecified atrial fibrillation: Secondary | ICD-10-CM | POA: Diagnosis not present

## 2022-11-12 DIAGNOSIS — Z955 Presence of coronary angioplasty implant and graft: Secondary | ICD-10-CM | POA: Diagnosis not present

## 2022-11-12 DIAGNOSIS — G8918 Other acute postprocedural pain: Secondary | ICD-10-CM | POA: Diagnosis not present

## 2022-11-12 DIAGNOSIS — I2510 Atherosclerotic heart disease of native coronary artery without angina pectoris: Secondary | ICD-10-CM | POA: Diagnosis not present

## 2022-11-12 DIAGNOSIS — Z79899 Other long term (current) drug therapy: Secondary | ICD-10-CM | POA: Insufficient documentation

## 2022-11-12 DIAGNOSIS — Z96651 Presence of right artificial knee joint: Principal | ICD-10-CM

## 2022-11-12 DIAGNOSIS — M1711 Unilateral primary osteoarthritis, right knee: Secondary | ICD-10-CM

## 2022-11-12 DIAGNOSIS — I10 Essential (primary) hypertension: Secondary | ICD-10-CM | POA: Diagnosis not present

## 2022-11-12 DIAGNOSIS — Z7982 Long term (current) use of aspirin: Secondary | ICD-10-CM | POA: Insufficient documentation

## 2022-11-12 DIAGNOSIS — Z8546 Personal history of malignant neoplasm of prostate: Secondary | ICD-10-CM | POA: Diagnosis not present

## 2022-11-12 DIAGNOSIS — Z01818 Encounter for other preprocedural examination: Secondary | ICD-10-CM

## 2022-11-12 HISTORY — PX: TOTAL KNEE ARTHROPLASTY: SHX125

## 2022-11-12 SURGERY — ARTHROPLASTY, KNEE, TOTAL
Anesthesia: Spinal | Site: Knee | Laterality: Right

## 2022-11-12 MED ORDER — EPHEDRINE SULFATE-NACL 50-0.9 MG/10ML-% IV SOSY
PREFILLED_SYRINGE | INTRAVENOUS | Status: DC | PRN
Start: 2022-11-12 — End: 2022-11-12
  Administered 2022-11-12: 10 mg via INTRAVENOUS

## 2022-11-12 MED ORDER — ACETAMINOPHEN 325 MG PO TABS
325.0000 mg | ORAL_TABLET | Freq: Four times a day (QID) | ORAL | Status: DC | PRN
  Administered 2022-11-13: 650 mg via ORAL
  Filled 2022-11-12: qty 2

## 2022-11-12 MED ORDER — BISACODYL 10 MG RE SUPP: 10.0000 mg | Freq: Every day | RECTAL | Status: DC | PRN

## 2022-11-12 MED ORDER — CEFAZOLIN SODIUM-DEXTROSE 2-4 GM/100ML-% IV SOLN
INTRAVENOUS | Status: AC
  Administered 2022-11-12: 2 g via INTRAVENOUS
  Filled 2022-11-12: qty 100

## 2022-11-12 MED ORDER — NITROGLYCERIN 0.4 MG SL SUBL: 0.4000 mg | SUBLINGUAL_TABLET | SUBLINGUAL | Status: DC | PRN

## 2022-11-12 MED ORDER — ONDANSETRON HCL 4 MG/2ML IJ SOLN: 4.0000 mg | Freq: Four times a day (QID) | INTRAMUSCULAR | Status: DC | PRN

## 2022-11-12 MED ORDER — HYDROCHLOROTHIAZIDE 25 MG PO TABS
25.0000 mg | ORAL_TABLET | Freq: Every day | ORAL | Status: DC
  Administered 2022-11-13: 25 mg via ORAL
  Filled 2022-11-12: qty 1

## 2022-11-12 MED ORDER — METOCLOPRAMIDE HCL 5 MG PO TABS: 5.0000 mg | ORAL_TABLET | Freq: Three times a day (TID) | ORAL | Status: DC | PRN

## 2022-11-12 MED ORDER — METOCLOPRAMIDE HCL 5 MG/ML IJ SOLN: 5.0000 mg | Freq: Three times a day (TID) | INTRAMUSCULAR | Status: DC | PRN

## 2022-11-12 MED ORDER — HYDROMORPHONE HCL 1 MG/ML IJ SOLN
0.5000 mg | INTRAMUSCULAR | Status: DC | PRN
  Administered 2022-11-13 (×3): 1 mg via INTRAVENOUS
  Filled 2022-11-12 (×3): qty 1

## 2022-11-12 MED ORDER — SODIUM CHLORIDE 0.9 % IR SOLN
Status: DC | PRN
  Administered 2022-11-12: 1000 mL

## 2022-11-12 MED ORDER — SODIUM CHLORIDE 0.9% FLUSH
10.0000 mL | Freq: Two times a day (BID) | INTRAVENOUS | Status: DC
  Administered 2022-11-12 – 2022-11-13 (×2): 10 mL via INTRAVENOUS

## 2022-11-12 MED ORDER — DEXAMETHASONE SODIUM PHOSPHATE 10 MG/ML IJ SOLN
INTRAMUSCULAR | Status: DC | PRN
  Administered 2022-11-12: 10 mg

## 2022-11-12 MED ORDER — ONDANSETRON HCL 4 MG/2ML IJ SOLN
INTRAMUSCULAR | Status: DC | PRN
  Administered 2022-11-12: 4 mg via INTRAVENOUS

## 2022-11-12 MED ORDER — METHOCARBAMOL 500 MG PO TABS
500.0000 mg | ORAL_TABLET | Freq: Four times a day (QID) | ORAL | Status: DC | PRN
  Administered 2022-11-13 (×2): 500 mg via ORAL
  Filled 2022-11-12 (×2): qty 1

## 2022-11-12 MED ORDER — BUPIVACAINE-EPINEPHRINE 0.25% -1:200000 IJ SOLN
INTRAMUSCULAR | Status: AC
Start: 2022-11-12 — End: ?
  Filled 2022-11-12: qty 1

## 2022-11-12 MED ORDER — ORAL CARE MOUTH RINSE: 15.0000 mL | Freq: Once | OROMUCOSAL | Status: AC

## 2022-11-12 MED ORDER — BUPIVACAINE LIPOSOME 1.3 % IJ SUSP
INTRAMUSCULAR | Status: AC
  Filled 2022-11-12: qty 20

## 2022-11-12 MED ORDER — PHENYLEPHRINE HCL-NACL 20-0.9 MG/250ML-% IV SOLN
INTRAVENOUS | Status: DC | PRN
  Administered 2022-11-12: 40 ug/min via INTRAVENOUS

## 2022-11-12 MED ORDER — AMISULPRIDE (ANTIEMETIC) 5 MG/2ML IV SOLN: 10.0000 mg | Freq: Once | INTRAVENOUS | Status: DC | PRN

## 2022-11-12 MED ORDER — LISINOPRIL 20 MG PO TABS
20.0000 mg | ORAL_TABLET | Freq: Every day | ORAL | Status: DC
  Administered 2022-11-13: 20 mg via ORAL
  Filled 2022-11-12: qty 1

## 2022-11-12 MED ORDER — ASPIRIN 81 MG PO CHEW
81.0000 mg | CHEWABLE_TABLET | Freq: Two times a day (BID) | ORAL | 0 refills | Status: AC
Start: 2022-11-12 — End: 2022-12-12

## 2022-11-12 MED ORDER — METHOCARBAMOL 1000 MG/10ML IJ SOLN
INTRAMUSCULAR | Status: AC
  Administered 2022-11-12: 500 mg via INTRAVENOUS
  Filled 2022-11-12: qty 10

## 2022-11-12 MED ORDER — SODIUM CHLORIDE (PF) 0.9 % IJ SOLN
INTRAMUSCULAR | Status: AC
  Filled 2022-11-12: qty 10

## 2022-11-12 MED ORDER — TRANEXAMIC ACID-NACL 1000-0.7 MG/100ML-% IV SOLN
INTRAVENOUS | Status: AC
  Administered 2022-11-12: 1000 mg via INTRAVENOUS
  Filled 2022-11-12: qty 100

## 2022-11-12 MED ORDER — ASPIRIN 81 MG PO CHEW
81.0000 mg | CHEWABLE_TABLET | Freq: Two times a day (BID) | ORAL | Status: DC
  Administered 2022-11-12 – 2022-11-13 (×2): 81 mg via ORAL
  Filled 2022-11-12 (×2): qty 1

## 2022-11-12 MED ORDER — FENTANYL CITRATE PF 50 MCG/ML IJ SOSY
PREFILLED_SYRINGE | INTRAMUSCULAR | Status: AC
  Administered 2022-11-12: 50 ug via INTRAVENOUS
  Filled 2022-11-12: qty 2

## 2022-11-12 MED ORDER — ONDANSETRON HCL 4 MG PO TABS: 4.0000 mg | ORAL_TABLET | Freq: Four times a day (QID) | ORAL | Status: DC | PRN

## 2022-11-12 MED ORDER — LIDOCAINE HCL (PF) 2 % IJ SOLN
INTRAMUSCULAR | Status: AC
  Filled 2022-11-12: qty 5

## 2022-11-12 MED ORDER — METHOCARBAMOL 1000 MG/10ML IJ SOLN: 500.0000 mg | Freq: Four times a day (QID) | INTRAMUSCULAR | Status: DC | PRN

## 2022-11-12 MED ORDER — METOPROLOL SUCCINATE ER 25 MG PO TB24
25.0000 mg | ORAL_TABLET | Freq: Every day | ORAL | Status: DC
  Administered 2022-11-13: 25 mg via ORAL
  Filled 2022-11-12: qty 1

## 2022-11-12 MED ORDER — 0.9 % SODIUM CHLORIDE (POUR BTL) OPTIME
TOPICAL | Status: DC | PRN
  Administered 2022-11-12: 1000 mL

## 2022-11-12 MED ORDER — FENTANYL CITRATE PF 50 MCG/ML IJ SOSY
PREFILLED_SYRINGE | INTRAMUSCULAR | Status: AC
  Administered 2022-11-12: 50 ug via INTRAVENOUS
  Filled 2022-11-12: qty 1

## 2022-11-12 MED ORDER — BUPIVACAINE IN DEXTROSE 0.75-8.25 % IT SOLN
INTRATHECAL | Status: DC | PRN
  Administered 2022-11-12: 2 mL via INTRATHECAL

## 2022-11-12 MED ORDER — ONDANSETRON HCL 4 MG PO TABS: 4.0000 mg | ORAL_TABLET | Freq: Three times a day (TID) | ORAL | 1 refills | Status: AC | PRN

## 2022-11-12 MED ORDER — DOCUSATE SODIUM 100 MG PO CAPS
100.0000 mg | ORAL_CAPSULE | Freq: Two times a day (BID) | ORAL | Status: DC
  Administered 2022-11-12 – 2022-11-13 (×2): 100 mg via ORAL
  Filled 2022-11-12 (×2): qty 1

## 2022-11-12 MED ORDER — ACETAMINOPHEN 500 MG PO TABS: 500.0000 mg | ORAL_TABLET | Freq: Four times a day (QID) | ORAL | Status: DC | PRN

## 2022-11-12 MED ORDER — ONDANSETRON HCL 4 MG/2ML IJ SOLN: 4.0000 mg | Freq: Once | INTRAMUSCULAR | Status: DC | PRN

## 2022-11-12 MED ORDER — CEFAZOLIN SODIUM-DEXTROSE 2-4 GM/100ML-% IV SOLN
2.0000 g | Freq: Four times a day (QID) | INTRAVENOUS | Status: AC
  Administered 2022-11-13: 2 g via INTRAVENOUS
  Filled 2022-11-12: qty 100

## 2022-11-12 MED ORDER — ONDANSETRON HCL 4 MG/2ML IJ SOLN
INTRAMUSCULAR | Status: AC
  Filled 2022-11-12: qty 2

## 2022-11-12 MED ORDER — PROPOFOL 1000 MG/100ML IV EMUL
INTRAVENOUS | Status: AC
  Filled 2022-11-12: qty 100

## 2022-11-12 MED ORDER — PHENOL 1.4 % MT LIQD: 1.0000 | OROMUCOSAL | Status: DC | PRN

## 2022-11-12 MED ORDER — PROPOFOL 500 MG/50ML IV EMUL
INTRAVENOUS | Status: AC
  Filled 2022-11-12: qty 50

## 2022-11-12 MED ORDER — PANTOPRAZOLE SODIUM 40 MG PO TBEC
40.0000 mg | DELAYED_RELEASE_TABLET | Freq: Every day | ORAL | Status: DC
  Administered 2022-11-13: 40 mg via ORAL
  Filled 2022-11-12: qty 1

## 2022-11-12 MED ORDER — MIDAZOLAM HCL 2 MG/2ML IJ SOLN
INTRAMUSCULAR | Status: AC
  Administered 2022-11-12: 2 mg via INTRAVENOUS
  Filled 2022-11-12: qty 2

## 2022-11-12 MED ORDER — METHOCARBAMOL 500 MG PO TABS: 500.0000 mg | ORAL_TABLET | Freq: Three times a day (TID) | ORAL | 1 refills | Status: DC | PRN

## 2022-11-12 MED ORDER — SODIUM CHLORIDE (PF) 0.9 % IJ SOLN
INTRAMUSCULAR | Status: DC | PRN
  Administered 2022-11-12: 80 mL

## 2022-11-12 MED ORDER — DEXAMETHASONE SODIUM PHOSPHATE 10 MG/ML IJ SOLN
INTRAMUSCULAR | Status: AC
  Filled 2022-11-12: qty 1

## 2022-11-12 MED ORDER — OXYCODONE-ACETAMINOPHEN 5-325 MG PO TABS
1.0000 | ORAL_TABLET | ORAL | 0 refills | Status: AC | PRN
Start: 2022-11-12 — End: 2023-11-12

## 2022-11-12 MED ORDER — MIDAZOLAM HCL 2 MG/2ML IJ SOLN
1.0000 mg | Freq: Once | INTRAMUSCULAR | Status: AC
Start: 2022-11-12 — End: 2022-11-12

## 2022-11-12 MED ORDER — MENTHOL 3 MG MT LOZG: 1.0000 | LOZENGE | OROMUCOSAL | Status: DC | PRN

## 2022-11-12 MED ORDER — POLYETHYLENE GLYCOL 3350 17 G PO PACK: 17.0000 g | PACK | Freq: Every day | ORAL | Status: DC | PRN

## 2022-11-12 MED ORDER — CEFAZOLIN IN SODIUM CHLORIDE 3-0.9 GM/100ML-% IV SOLN
3.0000 g | INTRAVENOUS | Status: AC
  Administered 2022-11-12: 3 g via INTRAVENOUS
  Filled 2022-11-12: qty 100

## 2022-11-12 MED ORDER — CHLORHEXIDINE GLUCONATE 0.12 % MT SOLN
15.0000 mL | Freq: Once | OROMUCOSAL | Status: AC
Start: 2022-11-12 — End: 2022-11-12
  Administered 2022-11-12: 15 mL via OROMUCOSAL

## 2022-11-12 MED ORDER — DOCUSATE SODIUM 100 MG PO CAPS: 200.0000 mg | ORAL_CAPSULE | Freq: Every day | ORAL | Status: DC

## 2022-11-12 MED ORDER — TRANEXAMIC ACID-NACL 1000-0.7 MG/100ML-% IV SOLN
1000.0000 mg | INTRAVENOUS | Status: AC
  Administered 2022-11-12: 1000 mg via INTRAVENOUS
  Filled 2022-11-12: qty 100

## 2022-11-12 MED ORDER — LACTATED RINGERS IV SOLN: INTRAVENOUS | Status: DC | PRN

## 2022-11-12 MED ORDER — ROPIVACAINE HCL 5 MG/ML IJ SOLN
INTRAMUSCULAR | Status: DC | PRN
  Administered 2022-11-12: 20 mL via PERINEURAL

## 2022-11-12 MED ORDER — OXYCODONE HCL 5 MG PO TABS
5.0000 mg | ORAL_TABLET | ORAL | Status: DC | PRN
  Administered 2022-11-12 – 2022-11-13 (×2): 10 mg via ORAL
  Administered 2022-11-13: 5 mg via ORAL
  Filled 2022-11-12: qty 1
  Filled 2022-11-12 (×2): qty 2

## 2022-11-12 MED ORDER — WATER FOR IRRIGATION, STERILE IR SOLN
Status: DC | PRN
  Administered 2022-11-12: 1000 mL

## 2022-11-12 MED ORDER — ACETAMINOPHEN 500 MG PO TABS
1000.0000 mg | ORAL_TABLET | Freq: Once | ORAL | Status: AC
  Administered 2022-11-12: 1000 mg via ORAL
  Filled 2022-11-12: qty 2

## 2022-11-12 MED ORDER — FENTANYL CITRATE PF 50 MCG/ML IJ SOSY: 50.0000 ug | PREFILLED_SYRINGE | Freq: Once | INTRAMUSCULAR | Status: AC

## 2022-11-12 MED ORDER — POVIDONE-IODINE 10 % EX SWAB
2.0000 | Freq: Once | CUTANEOUS | Status: AC
  Administered 2022-11-12: 2 via TOPICAL

## 2022-11-12 MED ORDER — PROPOFOL 500 MG/50ML IV EMUL
INTRAVENOUS | Status: DC | PRN
  Administered 2022-11-12: 125 ug/kg/min via INTRAVENOUS

## 2022-11-12 MED ORDER — BUPIVACAINE LIPOSOME 1.3 % IJ SUSP: 20.0000 mL | Freq: Once | INTRAMUSCULAR | Status: DC

## 2022-11-12 MED ORDER — FENTANYL CITRATE PF 50 MCG/ML IJ SOSY
25.0000 ug | PREFILLED_SYRINGE | INTRAMUSCULAR | Status: DC | PRN
Start: 2022-11-12 — End: 2022-11-12

## 2022-11-12 MED ORDER — TRANEXAMIC ACID-NACL 1000-0.7 MG/100ML-% IV SOLN: 1000.0000 mg | Freq: Once | INTRAVENOUS | Status: AC

## 2022-11-12 MED ORDER — ATORVASTATIN CALCIUM 40 MG PO TABS: 80.0000 mg | ORAL_TABLET | Freq: Every day | ORAL | Status: DC

## 2022-11-12 SURGICAL SUPPLY — 55 items
ATTUNE MED DOME PAT 41 KNEE (Knees) IMPLANT
ATTUNE PS FEM RT SZ 8 CEM KNEE (Femur) IMPLANT
ATTUNE PSRP INSR SZ8 7 KNEE (Insert) IMPLANT
BAG COUNTER SPONGE SURGICOUNT (BAG) IMPLANT
BAG SPEC THK2 15X12 ZIP CLS (MISCELLANEOUS)
BAG SPNG CNTER NS LX DISP (BAG)
BAG ZIPLOCK 12X15 (MISCELLANEOUS) IMPLANT
BASE TIBIAL ATTUNE KNEE SZ9 (Knees) IMPLANT
BLADE SAG 18X100X1.27 (BLADE) ×1 IMPLANT
BLADE SAW SGTL 13X75X1.27 (BLADE) ×1 IMPLANT
BNDG CMPR MED 10X6 ELC LF (GAUZE/BANDAGES/DRESSINGS) ×1
BNDG ELASTIC 6X10 VLCR STRL LF (GAUZE/BANDAGES/DRESSINGS) ×1 IMPLANT
BNDG GAUZE DERMACEA FLUFF 4 (GAUZE/BANDAGES/DRESSINGS) ×1 IMPLANT
BNDG GZE DERMACEA 4 6PLY (GAUZE/BANDAGES/DRESSINGS) ×1
BOWL SMART MIX CTS (DISPOSABLE) ×1 IMPLANT
BSPLAT TIB 9 CMNT ROT PLAT STR (Knees) ×1 IMPLANT
CEMENT HV SMART SET (Cement) ×2 IMPLANT
COVER SURGICAL LIGHT HANDLE (MISCELLANEOUS) ×1 IMPLANT
CUFF TOURN SGL QUICK 34 (TOURNIQUET CUFF) ×1
CUFF TRNQT CYL 34X4.125X (TOURNIQUET CUFF) ×1 IMPLANT
DRAPE INCISE IOBAN 66X45 STRL (DRAPES) IMPLANT
DRAPE SHEET LG 3/4 BI-LAMINATE (DRAPES) ×1 IMPLANT
DRAPE U-SHAPE 47X51 STRL (DRAPES) ×1 IMPLANT
DRSG CURAD 3X16 NADH (PACKING) IMPLANT
DURAPREP 26ML APPLICATOR (WOUND CARE) ×1 IMPLANT
ELECT REM PT RETURN 15FT ADLT (MISCELLANEOUS) ×1 IMPLANT
GAUZE PAD ABD 8X10 STRL (GAUZE/BANDAGES/DRESSINGS) ×1 IMPLANT
GAUZE SPONGE 4X4 12PLY STRL (GAUZE/BANDAGES/DRESSINGS) ×1 IMPLANT
GLOVE BIOGEL PI IND STRL 7.5 (GLOVE) ×1 IMPLANT
GLOVE BIOGEL PI IND STRL 8.5 (GLOVE) ×1 IMPLANT
GLOVE ORTHO TXT STRL SZ7.5 (GLOVE) ×1 IMPLANT
GLOVE SURG ORTHO 8.5 STRL (GLOVE) ×1 IMPLANT
GOWN STRL REUS W/ TWL XL LVL3 (GOWN DISPOSABLE) ×2 IMPLANT
GOWN STRL REUS W/TWL XL LVL3 (GOWN DISPOSABLE) ×2
HANDPIECE INTERPULSE COAX TIP (DISPOSABLE) ×1
HOLDER FOLEY CATH W/STRAP (MISCELLANEOUS) IMPLANT
IMMOBILIZER KNEE 20 (SOFTGOODS) ×1
IMMOBILIZER KNEE 20 THIGH 36 (SOFTGOODS) IMPLANT
KIT TURNOVER KIT A (KITS) IMPLANT
MANIFOLD NEPTUNE II (INSTRUMENTS) ×1 IMPLANT
NS IRRIG 1000ML POUR BTL (IV SOLUTION) ×1 IMPLANT
PACK TOTAL KNEE CUSTOM (KITS) ×1 IMPLANT
PIN STEINMAN FIXATION KNEE (PIN) IMPLANT
PROTECTOR NERVE ULNAR (MISCELLANEOUS) ×1 IMPLANT
SET HNDPC FAN SPRY TIP SCT (DISPOSABLE) ×1 IMPLANT
STRIP CLOSURE SKIN 1/2X4 (GAUZE/BANDAGES/DRESSINGS) ×2 IMPLANT
SUT MNCRL AB 3-0 PS2 18 (SUTURE) ×1 IMPLANT
SUT VIC AB 0 CT1 36 (SUTURE) ×1 IMPLANT
SUT VIC AB 1 CT1 36 (SUTURE) ×2 IMPLANT
SUT VIC AB 2-0 CT1 27 (SUTURE) ×1
SUT VIC AB 2-0 CT1 TAPERPNT 27 (SUTURE) ×1 IMPLANT
TIBIAL BASE ATTUNE KNEE SZ9 (Knees) ×1 IMPLANT
TRAY CATH INTERMITTENT SS 16FR (CATHETERS) ×1 IMPLANT
WATER STERILE IRR 1000ML POUR (IV SOLUTION) ×2 IMPLANT
YANKAUER SUCT BULB TIP NO VENT (SUCTIONS) ×1 IMPLANT

## 2022-11-12 NOTE — Discharge Instructions (Signed)
Ice to the knee constantly.  Keep the incision covered and clean and dry for one week, then ok to get it wet in the shower. Please remove all bandages in one week, then leave open to air after that.   Do exercise as instructed every hour, please to prevent stiffness.    DO NOT prop anything under the knee, it will make your knee stiff.  Prop under the ankle to encourage your knee to go straight.   Use the walker while you are up and around for balance.  Wear your support stockings 24/7 to prevent blood clots and take baby aspirin twice daily for 30 days also to prevent blood clots  Follow up with Dr Ranell Patrick in two weeks in the office, call [phone] for appt  Please call Dr Ranell Patrick (cell) at [phone] with any questions or concerns.   INSTRUCTIONS AFTER JOINT REPLACEMENT   Remove items at home which could result in a fall. This includes throw rugs or furniture in walking pathways ICE to the affected joint every three hours while awake for 30 minutes at a time, for at least the first 3-5 days, and then as needed for pain and swelling.  Continue to use ice for pain and swelling. You may notice swelling that will progress down to the foot and ankle.  This is normal after surgery.  Elevate your leg when you are not up walking on it.   Continue to use the breathing machine you got in the hospital (incentive spirometer) which will help keep your temperature down.  It is common for your temperature to cycle up and down following surgery, especially at night when you are not up moving around and exerting yourself.  The breathing machine keeps your lungs expanded and your temperature down.   DIET:  As you were doing prior to hospitalization, we recommend a well-balanced diet.  DRESSING / WOUND CARE / SHOWERING  You may change your dressing 3-5 days after surgery.  Then change the dressing every day with sterile gauze.  Please use good hand washing techniques before changing the dressing.  Do not  use any lotions or creams on the incision until instructed by your surgeon.  ACTIVITY  Increase activity slowly as tolerated, but follow the weight bearing instructions below.   No driving for 6 weeks or until further direction given by your physician.  You cannot drive while taking narcotics.  No lifting or carrying greater than 10 lbs. until further directed by your surgeon. Avoid periods of inactivity such as sitting longer than an hour when not asleep. This helps prevent blood clots.  You may return to work once you are authorized by your doctor.     WEIGHT BEARING   Weight bearing as tolerated with assist device (walker, cane, etc) as directed, use it as long as suggested by your surgeon or therapist, typically at least 4-6 weeks.   EXERCISES  Results after joint replacement surgery are often greatly improved when you follow the exercise, range of motion and muscle strengthening exercises prescribed by your doctor. Safety measures are also important to protect the joint from further injury. Any time any of these exercises cause you to have increased pain or swelling, decrease what you are doing until you are comfortable again and then slowly increase them. If you have problems or questions, call your caregiver or physical therapist for advice.   Rehabilitation is important following a joint replacement. After just a few days of immobilization, the muscles  of the leg can become weakened and shrink (atrophy).  These exercises are designed to build up the tone and strength of the thigh and leg muscles and to improve motion. Often times heat used for twenty to thirty minutes before working out will loosen up your tissues and help with improving the range of motion but do not use heat for the first two weeks following surgery (sometimes heat can increase post-operative swelling).   These exercises can be done on a training (exercise) mat, on the floor, on a table or on a bed. Use whatever  works the best and is most comfortable for you.    Use music or television while you are exercising so that the exercises are a pleasant break in your day. This will make your life better with the exercises acting as a break in your routine that you can look forward to.   Perform all exercises about fifteen times, three times per day or as directed.  You should exercise both the operative leg and the other leg as well.  Exercises include:   Quad Sets - Tighten up the muscle on the front of the thigh (Quad) and hold for 5-10 seconds.   Straight Leg Raises - With your knee straight (if you were given a brace, keep it on), lift the leg to 60 degrees, hold for 3 seconds, and slowly lower the leg.  Perform this exercise against resistance later as your leg gets stronger.  Leg Slides: Lying on your back, slowly slide your foot toward your buttocks, bending your knee up off the floor (only go as far as is comfortable). Then slowly slide your foot back down until your leg is flat on the floor again.  Angel Wings: Lying on your back spread your legs to the side as far apart as you can without causing discomfort.  Hamstring Strength:  Lying on your back, push your heel against the floor with your leg straight by tightening up the muscles of your buttocks.  Repeat, but this time bend your knee to a comfortable angle, and push your heel against the floor.  You may put a pillow under the heel to make it more comfortable if necessary.   A rehabilitation program following joint replacement surgery can speed recovery and prevent re-injury in the future due to weakened muscles. Contact your doctor or a physical therapist for more information on knee rehabilitation.    CONSTIPATION  Constipation is defined medically as fewer than three stools per week and severe constipation as less than one stool per week.  Even if you have a regular bowel pattern at home, your normal regimen is likely to be disrupted due to multiple  reasons following surgery.  Combination of anesthesia, postoperative narcotics, change in appetite and fluid intake all can affect your bowels.   YOU MUST use at least one of the following options; they are listed in order of increasing strength to get the job done.  They are all available over the counter, and you may need to use some, POSSIBLY even all of these options:    Drink plenty of fluids (prune juice may be helpful) and high fiber foods Colace 100 mg by mouth twice a day  Senokot for constipation as directed and as needed Dulcolax (bisacodyl), take with full glass of water  Miralax (polyethylene glycol) once or twice a day as needed.  If you have tried all these things and are unable to have a bowel movement in the first 3-4  days after surgery call either your surgeon or your primary doctor.    If you experience loose stools or diarrhea, hold the medications until you stool forms back up.  If your symptoms do not get better within 1 week or if they get worse, check with your doctor.  If you experience "the worst abdominal pain ever" or develop nausea or vomiting, please contact the office immediately for further recommendations for treatment.   ITCHING:  If you experience itching with your medications, try taking only a single pain pill, or even half a pain pill at a time.  You can also use Benadryl over the counter for itching or also to help with sleep.   TED HOSE STOCKINGS:  Use stockings on both legs until for at least 2 weeks or as directed by physician office. They may be removed at night for sleeping.  MEDICATIONS:  See your medication summary on the "After Visit Summary" that nursing will review with you.  You may have some home medications which will be placed on hold until you complete the course of blood thinner medication.  It is important for you to complete the blood thinner medication as prescribed.  PRECAUTIONS:  If you experience chest pain or shortness of breath - call  [phone] immediately for transfer to the hospital emergency department.   If you develop a fever greater that 101 F, purulent drainage from wound, increased redness or drainage from wound, foul odor from the wound/dressing, or calf pain - CONTACT YOUR SURGEON.                                                   FOLLOW-UP APPOINTMENTS:  If you do not already have a post-op appointment, please call the office for an appointment to be seen by your surgeon.  Guidelines for how soon to be seen are listed in your "After Visit Summary", but are typically between 1-4 weeks after surgery.  OTHER INSTRUCTIONS:   Knee Replacement:  Do not place pillow under knee, focus on keeping the knee straight while resting. CPM instructions: 0-90 degrees, 2 hours in the morning, 2 hours in the afternoon, and 2 hours in the evening. Place foam block, curve side up under heel at all times except when in CPM or when walking.  DO NOT modify, tear, cut, or change the foam block in any way.  POST-OPERATIVE OPIOID TAPER INSTRUCTIONS: It is important to wean off of your opioid medication as soon as possible. If you do not need pain medication after your surgery it is ok to stop day one. Opioids include: Codeine, Hydrocodone(Norco, Vicodin), Oxycodone(Percocet, oxycontin) and hydromorphone amongst others.  Long term and even short term use of opiods can cause: Increased pain response Dependence Constipation Depression Respiratory depression And more.  Withdrawal symptoms can include Flu like symptoms Nausea, vomiting And more Techniques to manage these symptoms Hydrate well Eat regular healthy meals Stay active Use relaxation techniques(deep breathing, meditating, yoga) Do Not substitute Alcohol to help with tapering If you have been on opioids for less than two weeks and do not have pain than it is ok to stop all together.  Plan to wean off of opioids This plan should start within one week post op of your joint  replacement. Maintain the same interval or time between taking each dose and first decrease the dose.  Cut the total daily intake of opioids by one tablet each day Next start to increase the time between doses. The last dose that should be eliminated is the evening dose.   MAKE SURE YOU:  Understand these instructions.  Get help right away if you are not doing well or get worse.    Thank you for letting us be a part of your medical care team.  It is a privilege we respect greatly.  We hope these instructions will help you stay on track for a fast and full recovery!

## 2022-11-12 NOTE — Progress Notes (Signed)
Orthopedic Tech Progress Note Patient Details:  Phillip Rios 06-Oct-1950 [id number] CPM will be removed at 10:30 by nursing staff.  CPM Right Knee CPM Right Knee: On Right Knee Flexion (Degrees): 90 Right Knee Extension (Degrees): 0  Post Interventions Patient Tolerated: Well Ortho Devices Type of Ortho Device: Bone foam zero knee Ortho Device/Splint Location: Right knee Ortho Device/Splint Interventions: Application   Post Interventions Patient Tolerated: Well  [name] 11/12/2022, 6:51 PM

## 2022-11-12 NOTE — Anesthesia Postprocedure Evaluation (Signed)
Anesthesia Post Note  Patient: DANTHONY STONEBREAKER  Procedure(s) Performed: TOTAL KNEE ARTHROPLASTY (Right: Knee)     Patient location during evaluation: PACU Anesthesia Type: Spinal Level of consciousness: awake, awake and alert and oriented Pain management: pain level controlled Vital Signs Assessment: post-procedure vital signs reviewed and stable Respiratory status: spontaneous breathing, nonlabored ventilation and respiratory function stable Cardiovascular status: blood pressure returned to baseline and stable Postop Assessment: no headache, no backache, spinal receding and no apparent nausea or vomiting Anesthetic complications: no   No notable events documented.  Last Vitals:  Vitals:   11/12/22 1845 11/12/22 1900  BP: 102/70 110/85  Pulse: 76 76  Resp: 20 16  Temp:    SpO2: 97% 96%    Last Pain:  Vitals:   11/12/22 1420  TempSrc:   PainSc: 0-No pain                 Collene Schlichter

## 2022-11-12 NOTE — Transfer of Care (Signed)
Immediate Anesthesia Transfer of Care Note  Patient: Phillip Rios  Procedure(s) Performed: TOTAL KNEE ARTHROPLASTY (Right: Knee)  Patient Location: PACU  Anesthesia Type:MAC and Regional  Level of Consciousness: awake, alert , and oriented  Airway & Oxygen Therapy: Patient Spontanous Breathing and Patient connected to face mask oxygen  Post-op Assessment: Report given to RN and Post -op Vital signs reviewed and stable  Post vital signs: Reviewed and stable  Last Vitals:  Vitals Value Taken Time  BP 144/117 11/12/22 1758  Temp    Pulse 75 11/12/22 1800  Resp 16 11/12/22 1800  SpO2 97 % 11/12/22 1800  Vitals shown include unfiled device data.  Last Pain:  Vitals:   11/12/22 1420  TempSrc:   PainSc: 0-No pain         Complications: No notable events documented.

## 2022-11-12 NOTE — Interval H&P Note (Signed)
History and Physical Interval Note:  11/12/2022 2:54 PM  Phillip Rios  has presented today for surgery, with the diagnosis of Right knee osteoarthritis.  The various methods of treatment have been discussed with the patient and family. After consideration of risks, benefits and other options for treatment, the patient has consented to  Procedure(s) with comments: TOTAL KNEE ARTHROPLASTY (Right) - general as a surgical intervention.  The patient's history has been reviewed, patient examined, no change in status, stable for surgery.  I have reviewed the patient's chart and labs.  Questions were answered to the patient's satisfaction.     Verlee Rossetti

## 2022-11-12 NOTE — Anesthesia Procedure Notes (Signed)
Spinal  Patient location during procedure: OR Start time: 11/12/2022 3:48 PM End time: 11/12/2022 3:51 PM Reason for block: surgical anesthesia Staffing Performed: anesthesiologist  Anesthesiologist: Collene Schlichter, MD Performed by: Collene Schlichter, MD Authorized by: Collene Schlichter, MD   Preanesthetic Checklist Completed: patient identified, IV checked, risks and benefits discussed, surgical consent, monitors and equipment checked, pre-op evaluation and timeout performed Spinal Block Patient position: sitting Prep: DuraPrep and site prepped and draped Patient monitoring: continuous pulse ox and blood pressure Approach: midline Location: L3-4 Injection technique: single-shot Needle Needle type: Pencan  Needle gauge: 24 G Assessment Events: CSF return Additional Notes Functioning IV was confirmed and monitors were applied. Sterile prep and drape, including hand hygiene, mask and sterile gloves were used. The patient was positioned and the spine was prepped. The skin was anesthetized with lidocaine.  Free flow of clear CSF was obtained prior to injecting local anesthetic into the CSF.  The spinal needle aspirated freely following injection.  The needle was carefully withdrawn.  The patient tolerated the procedure well. Consent was obtained prior to procedure with all questions answered and concerns addressed. Risks including but not limited to bleeding, infection, nerve damage, paralysis, failed block, inadequate analgesia, allergic reaction, high spinal, itching and headache were discussed and the patient wished to proceed.   Arrie Aran, MD

## 2022-11-12 NOTE — Care Plan (Signed)
Ortho Bundle Case Management Note  Patient Details  Name: Phillip Rios MRN: [id number] Date of Birth: Dec 22, 1950                  R TKA on 11/12/22.  DCP: Home with wife [name].  DME: RW ordered through [employer].  PT: [name] 10/28   DME Arranged:  Dan Humphreys rolling DME Agency:  [employer]    Additional Comments: Please contact me with any questions of if this plan should need to change.    Despina Pole, CCM Case Manager, Raechel Chute  [phone] 11/12/2022, 12:43 PM

## 2022-11-12 NOTE — Op Note (Unsigned)
NAMECOURTNEY, REIFSNYDER MEDICAL RECORD NO: [id number] ACCOUNT NO: [id number] DATE OF BIRTH: 07-14-1950 FACILITY: [facility] LOCATION: [facility] PHYSICIAN: Almedia Balls. Ranell Patrick, MD  Operative Report   DATE OF PROCEDURE: 11/12/2022  PREOPERATIVE DIAGNOSIS:  Right knee end-stage arthritis.  POSTOPERATIVE DIAGNOSIS:  Right knee end-stage arthritis.  PROCEDURE PERFORMED:  Right total knee arthroplasty using DePuy Attune prosthesis.  ATTENDING SURGEON:  Almedia Balls. Ranell Patrick, MD  ASSISTANT:  Konrad Felix Dixon, [place], who was scrubbed during the entire procedure and necessary for satisfactory completion of surgery.  ANESTHESIA:  Spinal anesthesia plus adductor canal block was utilized.  ESTIMATED BLOOD LOSS:  Less than 100 mL  FLUID REPLACEMENT:  1000 mL crystalloid.  COUNTS:  Instrument counts correct.  COMPLICATIONS:  No complications.  ANTIBIOTICS:  Perioperative antibiotics were given.  TOURNIQUET TIME:  80 minutes at 300 mmHg.  INDICATIONS:  The patient is a 72 year old male who presents with a history of worsening right knee pain due to end-stage arthritis.  The patient has failed conservative management, desires operative treatment to eliminate pain and restore function.   Informed consent obtained.  DESCRIPTION OF PROCEDURE:  After an adequate level of anesthesia was achieved, the patient was positioned supine on the operating table.  A nonsterile tourniquet placed on right proximal thigh.  Right leg sterilely prepped and draped in the usual manner.   Timeout called, verifying correct patient and correct site. We elevated the leg and exsanguinated with an Esmarch bandage, inflating the tourniquet to 300 mmHg.  We then placed the knee in flexion and performed a longitudinal midline incision with a 10  blade scalpel. A fresh 10 blade scalpel was used to perform the medial parapatellar arthrotomy.  We then divided lateral patellofemoral ligaments, everting the patella and exposing the distal  femur.  There was complete loss of cartilage on the distal  femur.  We entered the distal femur with a step cut drill.  We then placed our intramedullary guide and resected 11 mm off the distal femur set on 5 degrees of valgus as the patient had a flexion contracture.  We then sized the femur to a size 8 anterior  down, performing anterior, posterior and chamfer cuts with the 4-in-1 block.  Next, we removed ACL and PCL meniscal tissue, subluxing the tibia anteriorly and then using the external alignment guide and external jig for the tibial cut, which we did 2 mm  off the affected medial side with minimal posterior slope.  Once we had the tibial cut done, we used a lamina spreader to remove posterior femoral condyle osteophytes and also released the posterior capsule.  We injected the posterior capsule with a  combination of Marcaine, Exparel and saline.  Next, we checked our gaps, which were symmetric at least 6 mm.  We removed the tibial pins and completed our tibial preparation for the 9 tibia, externally rotating the component as much as possible.  We used  the modular drill and keel punch for the remaining tibial preparation, leaving the tibial trial in place. We went to the femur and did our box cut for the 8 right femur and then we impacted the trial femur in place and drilled the lug holes.  Next, we  reduced with first a 6 mm poly, which provided full extension and good flexion stability.  We then went ahead and resurfaced the patella going from a 25 mm thickness down to 15 mm thickness.  We drilled lug holes for the 41 patellar button and then  trialed  the patellar button throughout a full arc of motion with excellent patellar tracking.  We removed all trial components, pulse irrigated the bone and dried well and we vacuum mixed high viscosity cement on the back table.  We then cemented the  components into place all in one step including tibia, femur and patella. We placed the knee in extension  with a 6 mm poly trial, which allowed for good compression of the bone cement while it set up and then also used a patellar clamp on the patella.   Once all cement was cured on the back table, we removed excess cement with quarter-inch curved osteotome.  I took the knee through a full range of motion, excellent patellar tracking noted.  We did feel like we could get the 7 mm poly in.  So we removed  the trial, checked the back of the knee for additional cement, findings none.  After irrigating thoroughly, we went ahead and placed the real size 8 7 mm poly on the tibia and reduced the knee.  I had nice little pop as the medial side reduced. We had  excellent patellar tracking with no-touch technique.  We did inject the anterior capsule with combination of Marcaine, Exparel and saline prior to closing.  We closed the parapatellar arthrotomy with #1 Vicryl suture, followed by 0 and 2-0 layers  subcutaneous closure and 4-0 running Monocryl for skin.  Sterile dressing applied, followed by a knee immobilizer.  The patient was taken to recovery room in stable condition.   VAI D: 11/12/2022 5:32:42 pm T: 11/12/2022 10:00:00 pm  JOB: [id number]/ [id number]

## 2022-11-12 NOTE — Brief Op Note (Signed)
11/12/2022  5:27 PM  PATIENT:  Phillip Rios  72 y.o. male  PRE-OPERATIVE DIAGNOSIS:  Right knee osteoarthritis, end stage  POST-OPERATIVE DIAGNOSIS:  Right knee osteoarthritis, end stage  PROCEDURE:  Procedure(s): TOTAL KNEE ARTHROPLASTY (Right) DePuy Attune  SURGEON:  Surgeons and Role:    Beverely Low, MD - Primary  PHYSICIAN ASSISTANT:   ASSISTANTS: Thea Gist, PA-C   ANESTHESIA:   regional and spinal  EBL:  100 mL   BLOOD ADMINISTERED:none  DRAINS: none   LOCAL MEDICATIONS USED:  MARCAINE     SPECIMEN:  No Specimen  DISPOSITION OF SPECIMEN:  N/A  COUNTS:  YES  TOURNIQUET:  80 minutes at 300 mm Hg  DICTATION: .Other Dictation: Dictation Number [id number]  PLAN OF CARE: Admit for overnight observation  PATIENT DISPOSITION:  PACU - hemodynamically stable.   Delay start of Pharmacological VTE agent (>24hrs) due to surgical blood loss or risk of bleeding: no

## 2022-11-12 NOTE — Anesthesia Procedure Notes (Signed)
Anesthesia Regional Block: Adductor canal block   Pre-Anesthetic Checklist: , timeout performed,  Correct Patient, Correct Site, Correct Laterality,  Correct Procedure, Correct Position, site marked,  Risks and benefits discussed,  Surgical consent,  Pre-op evaluation,  At surgeon's request and post-op pain management  Laterality: Right  Prep: chloraprep       Needles:  Injection technique: Single-shot  Needle Type: Echogenic Needle     Needle Length: 9cm  Needle Gauge: 21     Additional Needles:   Procedures:,,,, ultrasound used (permanent image in chart),,    Narrative:  Start time: 11/12/2022 2:08 PM End time: 11/12/2022 2:14 PM Injection made incrementally with aspirations every 5 mL.  Performed by: Personally  Anesthesiologist: Collene Schlichter, MD  Additional Notes: No pain on injection. No increased resistance to injection. Injection made in 5cc increments.  Good needle visualization.  Patient tolerated procedure well.

## 2022-11-13 ENCOUNTER — Other Ambulatory Visit: Payer: Self-pay

## 2022-11-13 DIAGNOSIS — M1711 Unilateral primary osteoarthritis, right knee: Secondary | ICD-10-CM | POA: Diagnosis not present

## 2022-11-13 DIAGNOSIS — R531 Weakness: Secondary | ICD-10-CM | POA: Diagnosis not present

## 2022-11-13 DIAGNOSIS — Z96651 Presence of right artificial knee joint: Secondary | ICD-10-CM | POA: Diagnosis not present

## 2022-11-13 NOTE — Evaluation (Signed)
Physical Therapy Evaluation Patient Details Name: Phillip Rios MRN: [id number] DOB: 06-Feb-1950 Today's Date: 11/13/2022  History of Present Illness  Pt s/p R TKR and with hx of obesity, CAD and prostate CA  Clinical Impression  Pt s/p R THR and presents with decreased R LE strength/ROM, post op pain and buckling of R knee with WB limiting functional mobility.  Pt plans dc home with family assist and reports first OP Pt scheduled for 11/15/22.        If plan is discharge home, recommend the following: A little help with walking and/or transfers;A little help with bathing/dressing/bathroom;Assistance with cooking/housework;Assist for transportation;Help with stairs or ramp for entrance   Can travel by private vehicle        Equipment Recommendations Rolling walker (2 wheels)  Recommendations for Other Services       Functional Status Assessment Patient has had a recent decline in their functional status and demonstrates the ability to make significant improvements in function in a reasonable and predictable amount of time.     Precautions / Restrictions Precautions Precautions: Fall Precaution Comments: Buckling noted at R knee with KI in place on eval Restrictions Weight Bearing Restrictions: No RLE Weight Bearing: Weight bearing as tolerated      Mobility  Bed Mobility Overal bed mobility: Needs Assistance Bed Mobility: Supine to Sit     Supine to sit: Contact guard     General bed mobility comments: safety    Transfers Overall transfer level: Needs assistance Equipment used: Rolling walker (2 wheels) Transfers: Sit to/from Stand Sit to Stand: Contact guard assist           General transfer comment: cues for LE management and use of UEs to self assist    Ambulation/Gait Ambulation/Gait assistance: Min assist Gait Distance (Feet): 65 Feet Assistive device: Rolling walker (2 wheels) Gait Pattern/deviations: Step-to pattern, Decreased step length -  right, Decreased step length - left, Shuffle, Trunk flexed       General Gait Details: cues for sequence, posture, position from RW and to slow down for safety 2* noted buckling at R knee  Stairs            Wheelchair Mobility     Tilt Bed    Modified Rankin (Stroke Patients Only)       Balance Overall balance assessment: Mild deficits observed, not formally tested                                           Pertinent Vitals/Pain Pain Assessment Pain Assessment: 0-10 Pain Score: 5  Pain Location: R knee Pain Descriptors / Indicators: Aching, Sore Pain Intervention(s): Limited activity within patient's tolerance, Monitored during session, Premedicated before session, Ice applied    Home Living Family/patient expects to be discharged to:: Private residence Living Arrangements: Spouse/significant other Available Help at Discharge: Family Type of Home: House Home Access: Stairs to enter Entrance Stairs-Rails: Right;Left;Can reach both Secretary/administrator of Steps: 2   Home Layout: One level Home Equipment: Cane - single point      Prior Function Prior Level of Function : Independent/Modified Independent                     Extremity/Trunk Assessment   Upper Extremity Assessment Upper Extremity Assessment: Overall WFL for tasks assessed    Lower Extremity Assessment Lower Extremity Assessment: RLE deficits/detail RLE  Deficits / Details: AAROM at knee -5 - 45 with noted quad lag on SLR    Cervical / Trunk Assessment Cervical / Trunk Assessment: Normal  Communication   Communication Communication: No apparent difficulties  Cognition Arousal: Alert Behavior During Therapy: WFL for tasks assessed/performed Overall Cognitive Status: Within Functional Limits for tasks assessed                                          General Comments      Exercises Total Joint Exercises Ankle Circles/Pumps: AROM, Both, 15  reps, Supine Quad Sets: AROM, Both, 10 reps, Supine Heel Slides: AAROM, 15 reps, Supine, Right Straight Leg Raises: AAROM, Right, 15 reps, Supine   Assessment/Plan    PT Assessment Patient needs continued PT services  PT Problem List Decreased strength;Decreased range of motion;Decreased activity tolerance;Decreased balance;Decreased mobility;Decreased knowledge of use of DME;Pain       PT Treatment Interventions DME instruction;Gait training;Stair training;Functional mobility training;Therapeutic activities;Therapeutic exercise;Patient/family education    PT Goals (Current goals can be found in the Care Plan section)  Acute Rehab PT Goals Patient Stated Goal: Regain IND PT Goal Formulation: With patient Time For Goal Achievement: 11/19/22 Potential to Achieve Goals: Good    Frequency 7X/week     Co-evaluation               AM-PAC PT "6 Clicks" Mobility  Outcome Measure Help needed turning from your back to your side while in a flat bed without using bedrails?: A Little Help needed moving from lying on your back to sitting on the side of a flat bed without using bedrails?: A Little Help needed moving to and from a bed to a chair (including a wheelchair)?: A Little Help needed standing up from a chair using your arms (e.g., wheelchair or bedside chair)?: A Little Help needed to walk in hospital room?: A Little Help needed climbing 3-5 steps with a railing? : A Lot 6 Click Score: 17    End of Session Equipment Utilized During Treatment: Gait belt;Right knee immobilizer Activity Tolerance: Patient tolerated treatment well Patient left: in chair;with call bell/phone within reach;with chair alarm set Nurse Communication: Mobility status PT Visit Diagnosis: Difficulty in walking, not elsewhere classified (R26.2)    Time: 1610-9604 PT Time Calculation (min) (ACUTE ONLY): 30 min   Charges:   PT Evaluation $PT Eval Low Complexity: 1 Low PT Treatments $Therapeutic  Exercise: 8-22 mins PT General Charges $$ ACUTE PT VISIT: 1 Visit         Mauro Kaufmann PT Acute Rehabilitation Services Pager [phone] Office [phone]   Awais Cobarrubias 11/13/2022, 12:30 PM

## 2022-11-13 NOTE — Progress Notes (Signed)
Physical Therapy Treatment Patient Details Name: Phillip Rios MRN: [id number] DOB: 06/07/1950 Today's Date: 11/13/2022   History of Present Illness Pt s/p R TKR and with hx of obesity, CAD and prostate CA    PT Comments  Pt motivated and progressing well with mobility including noted increased R quad strength and no buckling at at knee.  Pt up to ambulate in hall, negotiated stairs, and reviewed written HEP.  Pt eager for dc home this date.    If plan is discharge home, recommend the following: A little help with walking and/or transfers;A little help with bathing/dressing/bathroom;Assistance with cooking/housework;Assist for transportation;Help with stairs or ramp for entrance   Can travel by private vehicle        Equipment Recommendations  Rolling walker (2 wheels)    Recommendations for Other Services       Precautions / Restrictions Precautions Precautions: Fall Precaution Comments: Buckling noted at R knee with KI in place on eval Restrictions Weight Bearing Restrictions: No RLE Weight Bearing: Weight bearing as tolerated     Mobility  Bed Mobility Overal bed mobility: Needs Assistance Bed Mobility: Supine to Sit     Supine to sit: Contact guard     General bed mobility comments: Up in chair and requests back to same    Transfers Overall transfer level: Needs assistance Equipment used: Rolling walker (2 wheels) Transfers: Sit to/from Stand Sit to Stand: Supervision           General transfer comment: cues for LE management and use of UEs to self assist    Ambulation/Gait Ambulation/Gait assistance: Contact guard assist, Supervision Gait Distance (Feet): 120 Feet Assistive device: Rolling walker (2 wheels) Gait Pattern/deviations: Step-to pattern, Decreased step length - right, Decreased step length - left, Shuffle, Trunk flexed       General Gait Details: cues for sequence, posture, position from RW; no buckling at R knee  noted   Stairs Stairs: Yes Stairs assistance: Min assist, Contact guard assist Stair Management: Two rails, Step to pattern, Forwards Number of Stairs: 5 General stair comments: cues for seqeunce   Wheelchair Mobility     Tilt Bed    Modified Rankin (Stroke Patients Only)       Balance Overall balance assessment: Mild deficits observed, not formally tested                                          Cognition Arousal: Alert Behavior During Therapy: WFL for tasks assessed/performed Overall Cognitive Status: Within Functional Limits for tasks assessed                                          Exercises Total Joint Exercises Ankle Circles/Pumps: AROM, Both, 15 reps, Supine Quad Sets: AROM, Both, 10 reps, Supine Heel Slides: AAROM, 15 reps, Supine, Right Straight Leg Raises: AAROM, Right, 15 reps, Supine    General Comments        Pertinent Vitals/Pain Pain Assessment Pain Assessment: 0-10 Pain Score: 5  Pain Location: R knee Pain Descriptors / Indicators: Aching, Sore Pain Intervention(s): Limited activity within patient's tolerance, Monitored during session, Premedicated before session, Ice applied    Home Living  Prior Function            PT Goals (current goals can now be found in the care plan section) Acute Rehab PT Goals Patient Stated Goal: Regain IND PT Goal Formulation: With patient Time For Goal Achievement: 11/19/22 Potential to Achieve Goals: Good Progress towards PT goals: Progressing toward goals    Frequency    7X/week      PT Plan      Co-evaluation              AM-PAC PT "6 Clicks" Mobility   Outcome Measure  Help needed turning from your back to your side while in a flat bed without using bedrails?: A Little Help needed moving from lying on your back to sitting on the side of a flat bed without using bedrails?: A Little Help needed moving to and from  a bed to a chair (including a wheelchair)?: A Little Help needed standing up from a chair using your arms (e.g., wheelchair or bedside chair)?: A Little Help needed to walk in hospital room?: A Little Help needed climbing 3-5 steps with a railing? : A Little 6 Click Score: 18    End of Session Equipment Utilized During Treatment: Gait belt Activity Tolerance: Patient tolerated treatment well Patient left: in chair;with call bell/phone within reach;with chair alarm set Nurse Communication: Mobility status PT Visit Diagnosis: Difficulty in walking, not elsewhere classified (R26.2)     Time: 7829-5621 PT Time Calculation (min) (ACUTE ONLY): 25 min  Charges:    $Gait Training: 8-22 mins $Therapeutic Activity: 8-22 mins PT General Charges $$ ACUTE PT VISIT: 1 Visit                     Mauro Kaufmann PT Acute Rehabilitation Services Pager [phone] Office [phone]    Tarquin Welcher 11/13/2022, 4:00 PM

## 2022-11-13 NOTE — Progress Notes (Signed)
Pt received from PACU via bed at 2240.  VS stable.  Pt in no acute distress.    11/12/22 2244  Vitals  Temp 97.8 F (36.6 C)  Temp Source Oral  BP 98/69  MAP (mmHg) 80  BP Location Left Arm  BP Method Automatic  Patient Position (if appropriate) Lying  Pulse Rate 72  Pulse Rate Source Monitor  Resp 18  Level of Consciousness  Level of Consciousness Alert  MEWS COLOR  MEWS Score Color Green  Oxygen Therapy  SpO2 97 %  O2 Device Nasal Cannula  O2 Flow Rate (L/min) 2 L/min  Pain Assessment  Pain Scale 0-10  Pain Score 7  Pain Location Knee  Pain Orientation Right  Pain Descriptors / Indicators Burning;Throbbing  Pain Onset On-going  Patients Stated Pain Goal 5  Pain Intervention(s) Medication (See eMAR);Repositioned  POSS Scale (Pasero Opioid Sedation Scale)  POSS *See Group Information* 1-Acceptable,Awake and alert  MEWS Score  MEWS Temp 0  MEWS Systolic 1  MEWS Pulse 0  MEWS RR 0  MEWS LOC 0  MEWS Score 1   Hilton Sinclair BSN RN [facility] 11/13/2022, 12:12 AM

## 2022-11-13 NOTE — Care Management Obs Status (Signed)
MEDICARE OBSERVATION STATUS NOTIFICATION   Patient Details  Name: Phillip Rios MRN: [id number] Date of Birth: 1950/06/22   Medicare Observation Status Notification Given:  Yes    Darleene Cleaver, LCSW 11/13/2022, 1:07 PM

## 2022-11-13 NOTE — TOC Transition Note (Signed)
Transition of Care [facility]) - CM/SW Discharge Note   Patient Details  Name: Phillip Rios MRN: [id number] Date of Birth: 1950-03-31  [facility]) CM/SW Contact:  Darleene Cleaver, LCSW Phone Number: 11/13/2022, 1:04 PM   Clinical Narrative:     CSW spoke to patient and explained that he is on observation status.  Patient did not express any other questions.  CSW asked patient if he need a rolling walker he said yes per PT recommendations.  CSW contacted [employer] to order rolling walker, and spoke to [place] who will get it delivered to room prior to discharge.  Final next level of care: OP Rehab Barriers to Discharge: Barriers Resolved   Patient Goals and CMS Choice CMS Medicare.gov Compare Post Acute Care list provided to:: Patient Choice offered to / list presented to : Patient  Discharge Placement  CSW spoke to patient he will be going home, and has outpatient therapy already set up.             Discharge Plan and Services Additional resources added to the After Visit Summary for                  DME Arranged: Walker rolling DME Agency: [employer] Date DME Agency Contacted: 11/13/22 Time DME Agency Contacted: 1304 Representative spoke with at DME Agency: Selena Batten            Social Determinants of Health (SDOH) Interventions SDOH Screenings   Food Insecurity: No Food Insecurity (11/13/2022)  Housing: Low Risk  (11/13/2022)  Transportation Needs: No Transportation Needs (11/13/2022)  Utilities: Not At Risk (11/13/2022)  Tobacco Use: High Risk (11/12/2022)  Health Literacy: Low Risk  (02/10/2021)   Received from [place]     Readmission Risk Interventions     No data to display

## 2022-11-13 NOTE — Progress Notes (Signed)
   Subjective:  Phillip Rios is a 72 y.o. male, 1 Day Post-Op    s/p Procedure(s): TOTAL KNEE ARTHROPLASTY   Patient reports pain as mild to moderate.  Doing okay, denies numbness or tingling, fever chills, shortness of breath.  Passing gas.  Objective:   VITALS:   Vitals:   11/12/22 2244 11/13/22 0003 11/13/22 0649 11/13/22 0810  BP: 98/69 109/85 134/84 (!) 131/97  Pulse: 72 73 76 84  Resp: 18 14 16 18   Temp: 97.8 F (36.6 C) 98 F (36.7 C) 98.2 F (36.8 C) 98.3 F (36.8 C)  TempSrc: Oral Oral Oral Oral  SpO2: 97% 95% 97% 97%  Weight:      Height:       In hospital bed no acute distress  Right lower extremity: Neurologically intact Neurovascular intact Sensation intact distally Intact pulses distally Dorsiflexion/Plantar flexion intact Incision: dressing C/D/I Compartment soft Dressing changed to Aquacel, no signs of infection on the incision.  Lab Results  Component Value Date   WBC 5.9 11/03/2022   HGB 15.4 11/03/2022   HCT 45.2 11/03/2022   MCV 85.9 11/03/2022   PLT 183 11/03/2022   BMET    Component Value Date/Time   NA 137 11/03/2022 1030   K 3.0 (L) 11/03/2022 1030   CL 100 11/03/2022 1030   CO2 25 11/03/2022 1030   GLUCOSE 104 (H) 11/03/2022 1030   BUN 20 11/03/2022 1030   CREATININE 1.30 (H) 11/03/2022 1030   CREATININE 1.26 (H) 09/14/2018 0941   CALCIUM 9.3 11/03/2022 1030   GFRNONAA 59 (L) 11/03/2022 1030   GFRNONAA 59 (L) 09/14/2018 0941     Assessment/Plan: 1 Day Post-Op   Principal Problem:   Status post total knee replacement, right   Advance diet Up with therapy  Dispo: DC home once clears PT likely tomorrow  Weightbearing Status: Weightbearing as tolerated, knee immobilizer DVT Prophylaxis: Aspirin   Oretha Caprice Vita Currin 11/13/2022, 9:08 AM  Dion Saucier PA-C  Physician Assistant with Dr. Rebekah [place]

## 2022-11-14 NOTE — Discharge Summary (Signed)
In most cases prophylactic antibiotics for Dental procdeures after total joint surgery are not necessary.  Exceptions are as follows:  1. History of prior total joint infection  2. Severely immunocompromised (Organ Transplant, cancer chemotherapy, Rheumatoid biologic meds such as Humera)  3. Poorly controlled diabetes (A1C &gt; 8.0, blood glucose over 200)  If you have one of these conditions, contact your surgeon for an antibiotic prescription, prior to your dental procedure. Orthopedic Discharge Summary        Physician Discharge Summary  Patient ID: Phillip Rios MRN: [id number] DOB/AGE: 10/09/1950 72 y.o.  Admit date: 11/12/2022 Discharge date: 11/13/22   Procedures:  Procedure(s) (LRB): TOTAL KNEE ARTHROPLASTY (Right)  Attending Physician:  Dr. Malon Kindle  Admission Diagnoses:   right knee end stage osteoarthritis  Discharge Diagnoses:  right knee end stage osteoarthritis   Past Medical History:  Diagnosis Date   Anal fistula    Arthritis    both knees   Atrial fibrillation (HCC) 2018   short episode of afib, noted on event monitor   Coronary artery disease    coronoary stent   Dyslipidemia    GERD (gastroesophageal reflux disease)    Hypertension    Morbid obesity (HCC)    OSA on CPAP    cpap   Prostate cancer (HCC)    Stage IIC , prostate completed radiation 06/08/17   PSVT (paroxysmal supraventricular tachycardia) (HCC) 2018    noted on event monitor    PCP: Kirstie Peri, MD   Discharged Condition: good  Hospital Course:  Patient underwent the above stated procedure on 11/12/2022. Patient tolerated the procedure well and brought to the recovery room in good condition and subsequently to the floor. Patient had an uncomplicated hospital course and was stable for discharge.   Disposition: Discharge disposition: 01-Home or Self Care      with follow up in 2 weeks    Follow-up Information     Beverely Low, MD. Go on 11/24/2022.    Specialty: Orthopedic Surgery Why: You are scheduled for first post op appt on Wednesday November 6 at 10:30am. Contact information: [address] [place] [address] [phone]         Beverely Low, MD. Call in 2 week(s).   Specialty: Orthopedic Surgery Why: call [phone] for appt Contact information: [address] [place] [address] [phone]         [facility], [facility] Follow up.   Why: [facility] will be providing your rolling walker. Contact information: [address] [address] [phone]                 Dental Antibiotics:  In most cases prophylactic antibiotics for Dental procdeures after total joint surgery are not necessary.  Exceptions are as follows:  1. History of prior total joint infection  2. Severely immunocompromised (Organ Transplant, cancer chemotherapy, Rheumatoid biologic meds such as Humera)  3. Poorly controlled diabetes (A1C &gt; 8.0, blood glucose over 200)  If you have one of these conditions, contact your surgeon for an antibiotic prescription, prior to your dental procedure.  Discharge Instructions     Call MD / Call 911   Complete by: As directed    If you experience chest pain or shortness of breath, CALL 911 and be transported to the hospital emergency room.  If you develope a fever above 101 F, pus (white drainage) or increased drainage or redness at the wound, or calf pain, call  your surgeon's office.   Constipation Prevention   Complete by: As directed    Drink plenty of fluids.  Prune juice may be helpful.  You may use a stool softener, such as Colace (over the counter) 100 mg twice a day.  Use MiraLax (over the counter) for constipation as needed.   Diet - low sodium heart healthy   Complete by: As directed    Increase activity slowly as tolerated   Complete by: As directed    Post-operative opioid taper instructions:   Complete by:  As directed    POST-OPERATIVE OPIOID TAPER INSTRUCTIONS: It is important to wean off of your opioid medication as soon as possible. If you do not need pain medication after your surgery it is ok to stop day one. Opioids include: Codeine, Hydrocodone(Norco, Vicodin), Oxycodone(Percocet, oxycontin) and hydromorphone amongst others.  Long term and even short term use of opiods can cause: Increased pain response Dependence Constipation Depression Respiratory depression And more.  Withdrawal symptoms can include Flu like symptoms Nausea, vomiting And more Techniques to manage these symptoms Hydrate well Eat regular healthy meals Stay active Use relaxation techniques(deep breathing, meditating, yoga) Do Not substitute Alcohol to help with tapering If you have been on opioids for less than two weeks and do not have pain than it is ok to stop all together.  Plan to wean off of opioids This plan should start within one week post op of your joint replacement. Maintain the same interval or time between taking each dose and first decrease the dose.  Cut the total daily intake of opioids by one tablet each day Next start to increase the time between doses. The last dose that should be eliminated is the evening dose.          Allergies as of 11/13/2022   No Known Allergies      Medication List     STOP taking these medications    aspirin EC 81 MG tablet Replaced by: aspirin 81 MG chewable tablet   HYDROcodone-acetaminophen 5-325 MG tablet Commonly known as: NORCO/VICODIN       TAKE these medications    acetaminophen 500 MG tablet Commonly known as: TYLENOL Take 500 mg by mouth every 6 (six) hours as needed for moderate pain or headache.   aspirin 81 MG chewable tablet Commonly known as: Aspirin Childrens Chew 1 tablet (81 mg total) by mouth 2 (two) times daily. Replaces: aspirin EC 81 MG tablet   atorvastatin 80 MG tablet Commonly known as: LIPITOR Take 1 tablet  (80 mg total) by mouth daily at 6 PM.   docusate sodium 100 MG capsule Commonly known as: COLACE Take 200 mg by mouth daily.   hydrochlorothiazide 25 MG tablet Commonly known as: HYDRODIURIL Take 25 mg by mouth daily.   lisinopril 20 MG tablet Commonly known as: ZESTRIL Take 20 mg by mouth daily.   methocarbamol 500 MG tablet Commonly known as: ROBAXIN Take 1 tablet (500 mg total) by mouth every 8 (eight) hours as needed.   metoprolol succinate 50 MG 24 hr tablet Commonly known as: TOPROL-XL Take 25 mg by mouth daily.   nitroGLYCERIN 0.4 MG SL tablet Commonly known as: NITROSTAT Place 1 tablet (0.4 mg total) under the tongue every 5 (five) minutes as needed for chest pain.   ondansetron 4 MG tablet Commonly known as: Zofran Take 1 tablet (4 mg total) by mouth every 8 (eight) hours as needed for nausea, vomiting or refractory nausea / vomiting.   oxyCODONE-acetaminophen  5-325 MG tablet Commonly known as: Percocet Take 1-2 tablets by mouth every 4 (four) hours as needed for severe pain (pain score 7-10).   pantoprazole 40 MG tablet Commonly known as: PROTONIX Take 40 mg by mouth daily.          Signed: Thea Gist 11/14/2022, 8:19 AM  [facility] is now [facility] [address], [address], [place], [place] [address] Phone: [phone] Facebook  Instagram  [employer]

## 2022-11-15 DIAGNOSIS — M25561 Pain in right knee: Secondary | ICD-10-CM | POA: Diagnosis not present

## 2022-11-15 DIAGNOSIS — M25661 Stiffness of right knee, not elsewhere classified: Secondary | ICD-10-CM | POA: Diagnosis not present

## 2022-11-16 ENCOUNTER — Encounter (HOSPITAL_COMMUNITY): Payer: Self-pay | Admitting: Orthopedic Surgery

## 2022-11-17 DIAGNOSIS — M25661 Stiffness of right knee, not elsewhere classified: Secondary | ICD-10-CM | POA: Diagnosis not present

## 2022-11-17 DIAGNOSIS — M25561 Pain in right knee: Secondary | ICD-10-CM | POA: Diagnosis not present

## 2022-11-19 DIAGNOSIS — M25561 Pain in right knee: Secondary | ICD-10-CM | POA: Diagnosis not present

## 2022-11-19 DIAGNOSIS — M25661 Stiffness of right knee, not elsewhere classified: Secondary | ICD-10-CM | POA: Diagnosis not present

## 2022-11-22 DIAGNOSIS — M25561 Pain in right knee: Secondary | ICD-10-CM | POA: Diagnosis not present

## 2022-11-22 DIAGNOSIS — M25661 Stiffness of right knee, not elsewhere classified: Secondary | ICD-10-CM | POA: Diagnosis not present

## 2022-11-24 DIAGNOSIS — M25561 Pain in right knee: Secondary | ICD-10-CM | POA: Diagnosis not present

## 2022-11-24 DIAGNOSIS — Z4789 Encounter for other orthopedic aftercare: Secondary | ICD-10-CM | POA: Diagnosis not present

## 2022-11-24 DIAGNOSIS — M25661 Stiffness of right knee, not elsewhere classified: Secondary | ICD-10-CM | POA: Diagnosis not present

## 2022-11-29 DIAGNOSIS — M25661 Stiffness of right knee, not elsewhere classified: Secondary | ICD-10-CM | POA: Diagnosis not present

## 2022-11-29 DIAGNOSIS — M25561 Pain in right knee: Secondary | ICD-10-CM | POA: Diagnosis not present

## 2022-12-01 DIAGNOSIS — M25561 Pain in right knee: Secondary | ICD-10-CM | POA: Diagnosis not present

## 2022-12-01 DIAGNOSIS — M25661 Stiffness of right knee, not elsewhere classified: Secondary | ICD-10-CM | POA: Diagnosis not present

## 2022-12-03 DIAGNOSIS — R52 Pain, unspecified: Secondary | ICD-10-CM | POA: Diagnosis not present

## 2022-12-03 DIAGNOSIS — I480 Paroxysmal atrial fibrillation: Secondary | ICD-10-CM | POA: Diagnosis not present

## 2022-12-03 DIAGNOSIS — E250 Congenital adrenogenital disorders associated with enzyme deficiency: Secondary | ICD-10-CM | POA: Diagnosis not present

## 2022-12-03 DIAGNOSIS — Z299 Encounter for prophylactic measures, unspecified: Secondary | ICD-10-CM | POA: Diagnosis not present

## 2022-12-03 DIAGNOSIS — I10 Essential (primary) hypertension: Secondary | ICD-10-CM | POA: Diagnosis not present

## 2022-12-06 DIAGNOSIS — M25661 Stiffness of right knee, not elsewhere classified: Secondary | ICD-10-CM | POA: Diagnosis not present

## 2022-12-06 DIAGNOSIS — M25561 Pain in right knee: Secondary | ICD-10-CM | POA: Diagnosis not present

## 2022-12-08 DIAGNOSIS — M25561 Pain in right knee: Secondary | ICD-10-CM | POA: Diagnosis not present

## 2022-12-08 DIAGNOSIS — M25661 Stiffness of right knee, not elsewhere classified: Secondary | ICD-10-CM | POA: Diagnosis not present

## 2022-12-13 DIAGNOSIS — M25561 Pain in right knee: Secondary | ICD-10-CM | POA: Diagnosis not present

## 2022-12-13 DIAGNOSIS — M25661 Stiffness of right knee, not elsewhere classified: Secondary | ICD-10-CM | POA: Diagnosis not present

## 2022-12-15 DIAGNOSIS — M25561 Pain in right knee: Secondary | ICD-10-CM | POA: Diagnosis not present

## 2022-12-15 DIAGNOSIS — M25661 Stiffness of right knee, not elsewhere classified: Secondary | ICD-10-CM | POA: Diagnosis not present

## 2022-12-20 DIAGNOSIS — M25561 Pain in right knee: Secondary | ICD-10-CM | POA: Diagnosis not present

## 2022-12-20 DIAGNOSIS — M25661 Stiffness of right knee, not elsewhere classified: Secondary | ICD-10-CM | POA: Diagnosis not present

## 2022-12-22 DIAGNOSIS — M25561 Pain in right knee: Secondary | ICD-10-CM | POA: Diagnosis not present

## 2022-12-22 DIAGNOSIS — M25661 Stiffness of right knee, not elsewhere classified: Secondary | ICD-10-CM | POA: Diagnosis not present

## 2023-01-10 DIAGNOSIS — F17210 Nicotine dependence, cigarettes, uncomplicated: Secondary | ICD-10-CM | POA: Diagnosis not present

## 2023-01-10 DIAGNOSIS — L0291 Cutaneous abscess, unspecified: Secondary | ICD-10-CM | POA: Diagnosis not present

## 2023-01-10 DIAGNOSIS — Z299 Encounter for prophylactic measures, unspecified: Secondary | ICD-10-CM | POA: Diagnosis not present

## 2023-01-10 DIAGNOSIS — R52 Pain, unspecified: Secondary | ICD-10-CM | POA: Diagnosis not present

## 2023-01-10 DIAGNOSIS — I10 Essential (primary) hypertension: Secondary | ICD-10-CM | POA: Diagnosis not present

## 2023-03-10 DIAGNOSIS — I700 Atherosclerosis of aorta: Secondary | ICD-10-CM | POA: Diagnosis not present

## 2023-03-10 DIAGNOSIS — Z299 Encounter for prophylactic measures, unspecified: Secondary | ICD-10-CM | POA: Diagnosis not present

## 2023-03-10 DIAGNOSIS — R42 Dizziness and giddiness: Secondary | ICD-10-CM | POA: Diagnosis not present

## 2023-03-10 DIAGNOSIS — M1990 Unspecified osteoarthritis, unspecified site: Secondary | ICD-10-CM | POA: Diagnosis not present

## 2023-03-10 DIAGNOSIS — I480 Paroxysmal atrial fibrillation: Secondary | ICD-10-CM | POA: Diagnosis not present

## 2023-03-10 DIAGNOSIS — I10 Essential (primary) hypertension: Secondary | ICD-10-CM | POA: Diagnosis not present

## 2023-07-05 DIAGNOSIS — Z6839 Body mass index (BMI) 39.0-39.9, adult: Secondary | ICD-10-CM | POA: Diagnosis not present

## 2023-07-05 DIAGNOSIS — R066 Hiccough: Secondary | ICD-10-CM | POA: Diagnosis not present

## 2023-07-05 DIAGNOSIS — I10 Essential (primary) hypertension: Secondary | ICD-10-CM | POA: Diagnosis not present

## 2023-07-13 DIAGNOSIS — Z7189 Other specified counseling: Secondary | ICD-10-CM | POA: Diagnosis not present

## 2023-07-13 DIAGNOSIS — Z1331 Encounter for screening for depression: Secondary | ICD-10-CM | POA: Diagnosis not present

## 2023-07-13 DIAGNOSIS — Z299 Encounter for prophylactic measures, unspecified: Secondary | ICD-10-CM | POA: Diagnosis not present

## 2023-07-13 DIAGNOSIS — Z79899 Other long term (current) drug therapy: Secondary | ICD-10-CM | POA: Diagnosis not present

## 2023-07-13 DIAGNOSIS — Z1339 Encounter for screening examination for other mental health and behavioral disorders: Secondary | ICD-10-CM | POA: Diagnosis not present

## 2023-07-13 DIAGNOSIS — I10 Essential (primary) hypertension: Secondary | ICD-10-CM | POA: Diagnosis not present

## 2023-07-13 DIAGNOSIS — Z8546 Personal history of malignant neoplasm of prostate: Secondary | ICD-10-CM | POA: Diagnosis not present

## 2023-07-13 DIAGNOSIS — R52 Pain, unspecified: Secondary | ICD-10-CM | POA: Diagnosis not present

## 2023-07-13 DIAGNOSIS — E7800 Pure hypercholesterolemia, unspecified: Secondary | ICD-10-CM | POA: Diagnosis not present

## 2023-07-13 DIAGNOSIS — R5383 Other fatigue: Secondary | ICD-10-CM | POA: Diagnosis not present

## 2023-07-13 DIAGNOSIS — Z125 Encounter for screening for malignant neoplasm of prostate: Secondary | ICD-10-CM | POA: Diagnosis not present

## 2023-07-13 DIAGNOSIS — Z0000 Encounter for general adult medical examination without abnormal findings: Secondary | ICD-10-CM | POA: Diagnosis not present

## 2023-10-21 DIAGNOSIS — Z299 Encounter for prophylactic measures, unspecified: Secondary | ICD-10-CM | POA: Diagnosis not present

## 2023-10-21 DIAGNOSIS — I480 Paroxysmal atrial fibrillation: Secondary | ICD-10-CM | POA: Diagnosis not present

## 2023-10-21 DIAGNOSIS — Z23 Encounter for immunization: Secondary | ICD-10-CM | POA: Diagnosis not present

## 2023-10-21 DIAGNOSIS — Z1211 Encounter for screening for malignant neoplasm of colon: Secondary | ICD-10-CM | POA: Diagnosis not present

## 2023-10-21 DIAGNOSIS — I10 Essential (primary) hypertension: Secondary | ICD-10-CM | POA: Diagnosis not present

## 2023-10-21 DIAGNOSIS — M1710 Unilateral primary osteoarthritis, unspecified knee: Secondary | ICD-10-CM | POA: Diagnosis not present

## 2023-10-21 DIAGNOSIS — R52 Pain, unspecified: Secondary | ICD-10-CM | POA: Diagnosis not present

## 2023-10-21 DIAGNOSIS — Z0000 Encounter for general adult medical examination without abnormal findings: Secondary | ICD-10-CM | POA: Diagnosis not present

## 2023-11-08 DIAGNOSIS — M25562 Pain in left knee: Secondary | ICD-10-CM | POA: Diagnosis not present

## 2023-11-08 DIAGNOSIS — Z4789 Encounter for other orthopedic aftercare: Secondary | ICD-10-CM | POA: Diagnosis not present

## 2023-11-08 DIAGNOSIS — M1712 Unilateral primary osteoarthritis, left knee: Secondary | ICD-10-CM | POA: Diagnosis not present

## 2023-11-09 ENCOUNTER — Telehealth (HOSPITAL_BASED_OUTPATIENT_CLINIC_OR_DEPARTMENT_OTHER): Payer: Self-pay

## 2023-11-09 NOTE — Telephone Encounter (Signed)
   Name: Phillip Rios  DOB: March 12, 1950  MRN: [id number]  Primary Cardiologist: Phillip Carrier, MD  Chart reviewed as part of pre-operative protocol coverage. Because of Phillip Rios's past medical history and time since last visit, he will require a follow-up in-office visit in order to better assess preoperative cardiovascular risk. Last seen on 09/23/2022.   Pre-op covering staff: - Please schedule appointment and call patient to inform them. If patient already had an upcoming appointment within acceptable timeframe, please add pre-op clearance to the appointment notes so provider is aware. - Please contact requesting surgeon's office via preferred method (i.e, phone, fax) to inform them of need for appointment prior to surgery. SABRA Lamarr Satterfield, NP  11/09/2023, 12:08 PM

## 2023-11-09 NOTE — Telephone Encounter (Signed)
   Pre-operative Risk Assessment    Patient Name: Phillip Rios  DOB: 1950/12/29 MRN: [id number]   Date of last office visit: 09/23/2022 with Almarie Crate, NP Date of next office visit: N/A   Request for Surgical Clearance    Procedure:  Left TKA with choice anesthesia  Date of Surgery:  Clearance TBD                                 Surgeon:  Dr. Elspeth Her Surgeon's Group or Practice Name:  [facility] Phone number:  [phone]   Fax number:  [phone] - Attention: Duwaine Moats   Type of Clearance Requested:   - Medical    Type of Anesthesia:  Choice   Additional requests/questions:  N/A  SignedPatrcia Iverson CROME   11/09/2023, 9:08 AM

## 2023-11-10 NOTE — Telephone Encounter (Signed)
 Attempted to call patient to schedule an in office appointment, number was not in service.

## 2023-11-11 NOTE — Telephone Encounter (Signed)
 Will send FYI to requesting office we have not been able to reach the pt.

## 2023-11-11 NOTE — Telephone Encounter (Signed)
 2nd Attempted to call patient to schedule an in office appointment, number was not in service

## 2023-11-14 NOTE — Telephone Encounter (Signed)
 I did try to reach the pt again today. Same recording # is not in service.   Will update the requesting office the pt needs to call [phone] and schedule appt in office for preop clearance.

## 2023-12-06 ENCOUNTER — Encounter (INDEPENDENT_AMBULATORY_CARE_PROVIDER_SITE_OTHER): Payer: Self-pay | Admitting: *Deleted

## 2023-12-21 DIAGNOSIS — R52 Pain, unspecified: Secondary | ICD-10-CM | POA: Diagnosis not present

## 2023-12-21 DIAGNOSIS — Z6839 Body mass index (BMI) 39.0-39.9, adult: Secondary | ICD-10-CM | POA: Diagnosis not present

## 2023-12-21 DIAGNOSIS — I480 Paroxysmal atrial fibrillation: Secondary | ICD-10-CM | POA: Diagnosis not present

## 2023-12-21 DIAGNOSIS — M1710 Unilateral primary osteoarthritis, unspecified knee: Secondary | ICD-10-CM | POA: Diagnosis not present

## 2023-12-21 DIAGNOSIS — I10 Essential (primary) hypertension: Secondary | ICD-10-CM | POA: Diagnosis not present

## 2023-12-21 DIAGNOSIS — Z299 Encounter for prophylactic measures, unspecified: Secondary | ICD-10-CM | POA: Diagnosis not present

## 2023-12-21 DIAGNOSIS — M1990 Unspecified osteoarthritis, unspecified site: Secondary | ICD-10-CM | POA: Diagnosis not present

## 2023-12-22 ENCOUNTER — Encounter: Payer: Self-pay | Admitting: Nurse Practitioner

## 2023-12-22 ENCOUNTER — Ambulatory Visit: Attending: Nurse Practitioner | Admitting: Nurse Practitioner

## 2023-12-22 VITALS — BP 120/80 | HR 78 | Ht 73.0 in | Wt 297.2 lb

## 2023-12-22 DIAGNOSIS — I10 Essential (primary) hypertension: Secondary | ICD-10-CM

## 2023-12-22 DIAGNOSIS — Z01810 Encounter for preprocedural cardiovascular examination: Secondary | ICD-10-CM | POA: Diagnosis not present

## 2023-12-22 DIAGNOSIS — R0789 Other chest pain: Secondary | ICD-10-CM | POA: Diagnosis not present

## 2023-12-22 DIAGNOSIS — I2510 Atherosclerotic heart disease of native coronary artery without angina pectoris: Secondary | ICD-10-CM | POA: Diagnosis not present

## 2023-12-22 DIAGNOSIS — I471 Supraventricular tachycardia, unspecified: Secondary | ICD-10-CM

## 2023-12-22 MED ORDER — NITROGLYCERIN 0.4 MG SL SUBL: 0.4000 mg | SUBLINGUAL_TABLET | SUBLINGUAL | 3 refills | Status: AC | PRN

## 2023-12-22 MED ORDER — EZETIMIBE 10 MG PO TABS: 10.0000 mg | ORAL_TABLET | Freq: Every day | ORAL | 3 refills | Status: AC

## 2023-12-22 NOTE — Patient Instructions (Addendum)
 Medication Instructions:  Your physician has recommended you make the following change in your medication:  Start zetia 10 mg daily Continue all other medications as prescribed  Labwork: none  Testing/Procedures: none  Follow-Up: Your physician recommends that you schedule a follow-up appointment in: 2-3 months  Any Other Special Instructions Will Be Listed Below (If Applicable).  If you need a refill on your cardiac medications before your next appointment, please call your pharmacy.

## 2023-12-22 NOTE — Progress Notes (Signed)
 Cardiology Office Note:  .   Date:  12/22/2023 ID:  Phillip Rios, Phillip Rios 1950-05-16, MRN [id number] PCP: Phillip Isles, Phillip Rios  [facility] Providers Cardiologist:  Phillip Carrier, Phillip Rios    History of Present Illness: .   Phillip Rios is a 73 y.o. male with a PMH of chest pain/ CAD, HTN, OSA on CPAP, PSVT, dyslipidemia, hx of prostate cancer (completed radiation in 2019), GERD, CKD, and arthritis, who presents today for overdue 1 year follow-up.  NST in 2019 ordered by PCP, completed at [facility], showed areas of reversibility along lateral wall, apex, and inferior wall that were all completely reversible. Cath in 2019 showed 80% pLAD lesion, s/p DES.  Last seen by Dr. Carrier Phillip on December 15, 2021. Dr. Alvan noted that patient's orthostatic dizziness improved with stopping hydrochlorothiazide  and patient noted mild ongoing dizziness. Dr. Alvan recommended/suggested may need to reduce BP meds further pending symptoms. Patient also noted chronic atypical chest pains. Shortness of breath was felt to be d/t sedentary lifestyle and 40 lb weight gain over past several years.   09/23/2022 - Today he presents for pre-operative cardiovascular risk assessment. He is pending right total knee arthrotplasty. Surgeon will be Phillip Rios or Phillip Rios and surgery date is TBD. He states he is doing well. Denies any anginal chest pain, shortness of breath, palpitations, syncope, presyncope, dizziness, orthopnea, PND, swelling or significant weight changes, acute bleeding, or claudication. Does admit to recent MSK soreness along his chest, was resolved quickly and attributes this to a strained muscle. Says he was picking up a grill with his grandson at the time. Denies any recurrence.   12/22/2023 -  Here for overdue 1 year follow-up. Overall doing well. Does admit to some atypical chest pain intermittently, says he doesn't think about it, and not bothersome per his report. Only lasting a few  seconds in duration. Random in occurrence. Doing well on his current medication regimen. Denies any shortness of breath, palpitations, syncope, presyncope, dizziness, orthopnea, PND, swelling or significant weight changes, acute bleeding, or claudication.  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 Former games developer. Has 4 grandsons and 8 great grandchildren, ages range from 5 to a grandchild to be born last October.   ROS: See HPI. Rest of ROS Negative.  Studies Reviewed: SABRA    EKG:  EKG Interpretation Date/Time:  Thursday December 22 2023 14:06:08 EST Ventricular Rate:  81 PR Interval:  170 QRS Duration:  94 QT Interval:  402 QTC Calculation: 466 R Axis:   -61  Text Interpretation: Sinus rhythm with Premature atrial complexes Incomplete right bundle branch block Left anterior fascicular block Cannot rule out Inferior infarct (cited on or before 06-Nov-2018) Possible Anterior infarct (cited on or before 06-Nov-2018) When compared with ECG of 23-Sep-2022 14:33, Premature atrial complexes are now Present QRS axis Shifted right Confirmed by Phillip Rios [phone] on 12/22/2023 2:15:04 PM    Echo 03/2020:  1. Left ventricular ejection fraction, by estimation, is 60 to 65%. The  left ventricle has normal function. Left ventricular endocardial border  not optimally defined to evaluate regional wall motion. There is mild left  ventricular hypertrophy. Left  ventricular diastolic parameters are indeterminate.   2. Right ventricular systolic function is normal. The right ventricular  size is normal.   3. The mitral valve is normal in structure. No evidence of mitral valve  regurgitation. No evidence of mitral stenosis.  4. The aortic valve was not well visualized. Aortic valve regurgitation  is not visualized. No aortic stenosis is present.   5. Aortic dilatation noted. There is mild dilatation of the ascending  aorta, measuring 40 mm.    Comparison(s): Previous Echo was suboptimal, LV EF estimated at 65-70%,  Grade I diastolic dysfunction.  LHC 08/2017:  Prox LAD lesion is 80% stenosed. A drug-eluting stent was successfully placed using a STENT SIERRA 2.75 X 12 MM. Post intervention, there is a 0% residual stenosis. The left ventricular systolic function is normal. LV end diastolic pressure is normal. The left ventricular ejection fraction is 55-65% by visual estimate. There is no mitral valve regurgitation.   1. Severe stenosis mid LAD. The stenosis was eccentric and in some views did not appear to be severe. FFR 0.8 in the mid LAD suggesting the stenosis was likely flow limiting. Given the abnormal FFR and the abnormal nuclear stress test, PCI was performed.  2. Successful PTCA/DES x 1 mid LAD 3. Normal LV systolic function   Recommendations: Will start a statin.  Recommend uninterrupted dual antiplatelet therapy with Aspirin  81mg  daily and Clopidogrel  75mg  daily for a minimum of 12 months (ACS - Class I recommendation).  NST 08/2017:  Several areas of reversibility are noted involving the apex, lateral wall, and inferior wall. No fixed defect identified to suggest infarct.  Normal left ventricular wall motion.  LVEF: 61% 4. Non invasive risk stratification: Intermediate  Cardiac monitor 04/2016: See scanned result under tab CV Proc  Physical Exam:   VS:  BP 120/80 (BP Location: Right Arm, Patient Position: Sitting)   Pulse 78   Ht 6' 1 (1.854 m)   Wt 297 lb 3.2 oz (134.8 kg)   SpO2 97%   BMI 39.21 kg/m    Wt Readings from Last 3 Encounters:  12/22/23 297 lb 3.2 oz (134.8 kg)  11/12/22 286 lb (129.7 kg)  11/03/22 286 lb (129.7 kg)    GEN: Obese, 73 y.o. male in no acute distress NECK: No JVD; No carotid bruits CARDIAC: S1/S2, RRR, no murmurs, rubs, gallops RESPIRATORY:  Clear to auscultation without rales, wheezing or rhonchi  ABDOMEN: Soft, non-tender, non-distended EXTREMITIES:  No edema; No  deformity   ASSESSMENT AND PLAN: .    1. CAD, atypical chest pain Cath in 2019 showed 80% pLAD lesion, s/p DES.  Does admit to some atypical chest pain, difficult for him to describe and not bothersome per his report.  Discussed treatment options and condition medical decision will continue to monitor at this time. If by next follow-up visit his symptoms do not improve, consider arranging ischemic evaluation. Continue Aspirin , Atorvastatin , Lisinopril , Toprol  XL, and will refill NTG. Will also begin Zetia  to lower LDL < 70. Last LDL not at goal that I reviewed, LDL goal < 70. Heart healthy diet and regular cardiovascular exercise encouraged. Will request most recent labs from PCP. Plan to repeat FLP/LFT at next office visit.   3. HTN BP stable. Discussed to monitor BP at home at least 2 hours after medications and sitting for 5-10 minutes. No medication changes at this time. Heart healthy diet and regular cardiovascular exercise encouraged.   4. A-fib?, PSVT Denies any tachycardia or palpitations. Continue Toprol  XL. Chart states he has had a previous hx of A-fib. However, chart is limited and I do not see where this was captured on EKG or study. Most recent monitor report scanned did not detect A-fib. Detected some runs of SVT, longest lasting around  10 beats. Pt is in SR today. Continue Toprol  XL. Heart healthy diet and regular cardiovascular exercise encouraged.   5. Pre-op clearance Mr. Riccardi's perioperative risk of a major cardiac event is 0.4% according to the Revised Cardiac Risk Index (RCRI).  Therefore, he is at low risk for perioperative complications.   His functional capacity is good at 6.05 METs according to the Duke Activity Status Index (DASI). Recommendations: According to ACC/AHA guidelines, no further cardiovascular testing needed.  The patient may proceed to surgery at acceptable risk.   Antiplatelet and/or Anticoagulation Recommendations: Aspirin  can be held for 7 days prior  to his surgery.  Please resume Aspirin  post operatively when it is felt to be safe from a bleeding standpoint.   Dispo: Follow-up with Phillip Rios or APP in 2-3 months or sooner if anything changes.   Signed, Almarie Crate, NP

## 2024-02-08 NOTE — H&P (Signed)
 " Patient's anticipated LOS is less than 2 midnights, meeting these requirements: - Younger than 38 - Lives within 1 hour of care - Has a competent adult at home to recover with post-op recover - NO history of  - Chronic pain requiring opiods  - Diabetes  - Coronary Artery Disease  - Heart failure  - Heart attack  - Stroke  - DVT/VTE  - Cardiac arrhythmia  - Respiratory Failure/COPD  - Renal failure  - Anemia  - Advanced Liver disease     Phillip Rios is an 74 y.o. male.    Chief Complaint: left knee pain  HPI: Pt is a 74 y.o. male complaining of left knee pain for multiple years. Pain had continually increased since the beginning. X-rays in the clinic show end-stage arthritic changes of the left knee. Pt has tried various conservative treatments which have failed to alleviate their symptoms, including injections and therapy. Various options are discussed with the patient. Risks, benefits and expectations were discussed with the patient. Patient understand the risks, benefits and expectations and wishes to proceed with surgery.   PCP:  Maree Isles, MD  D/C Plans: Home  PMH: Past Medical History:  Diagnosis Date   Anal fistula    Arthritis    both knees   Atrial fibrillation (HCC) 2018   short episode of afib, noted on event monitor   Coronary artery disease    coronoary stent   Dyslipidemia    GERD (gastroesophageal reflux disease)    Hypertension    Morbid obesity (HCC)    OSA on CPAP    cpap   Prostate cancer (HCC)    Stage IIC , prostate completed radiation 06/08/17   PSVT (paroxysmal supraventricular tachycardia) 2018    noted on event monitor    PSH: Past Surgical History:  Procedure Laterality Date   ABDOMINAL SURGERY     Stomach surgery for obstruction as a child   CARDIAC CATHETERIZATION     cath   COLONOSCOPY     COLONOSCOPY N/A 12/21/2018   Procedure: COLONOSCOPY;  Surgeon: Golda Claudis PENNER, MD;  Location: [facility];  Service: Endoscopy;   Laterality: N/A;  100   CORONARY PRESSURE/FFR STUDY N/A 09/01/2017   Procedure: INTRAVASCULAR PRESSURE WIRE/FFR STUDY;  Surgeon: Verlin Lonni BIRCH, MD;  Location: [facility];  Service: Cardiovascular;  Laterality: N/A;   CORONARY STENT INTERVENTION  09/01/2017   CORONARY STENT INTERVENTION N/A 09/01/2017   Procedure: CORONARY STENT INTERVENTION;  Surgeon: Verlin Lonni BIRCH, MD;  Location: [facility];  Service: Cardiovascular;  Laterality: N/A;   EVALUATION UNDER ANESTHESIA WITH ANAL FISSUROTOMY N/A 06/15/2019   Procedure: ANAL EXAM UNDER ANESTHESIA, LIGATION OF INTERNAL FISTULA TRACT, FISTULOTOMY;  Surgeon: Debby Hila, MD;  Location: [facility];  Service: General;  Laterality: N/A;   LEFT HEART CATH AND CORONARY ANGIOGRAPHY N/A 09/01/2017   Procedure: LEFT HEART CATH AND CORONARY ANGIOGRAPHY;  Surgeon: Verlin Lonni BIRCH, MD;  Location: [facility];  Service: Cardiovascular;  Laterality: N/A;   LIGATION OF INTERNAL FISTULA TRACT N/A 03/29/2019   Procedure: LIGATION OF ANTERIOR FISTULA TRACT;  Surgeon: Debby Hila, MD;  Location: [facility];  Service: General;  Laterality: N/A;   PLACEMENT OF SETON  11/08/2018   Procedure: PLACEMENT OF SETON X 4 IN TRANSPHINCTERIC FISTULA;  Surgeon: Kallie Manuelita BROCKS, MD;  Location: [facility];  Service: General;;   TOTAL KNEE ARTHROPLASTY Right 11/12/2022   Procedure: TOTAL KNEE ARTHROPLASTY;  Surgeon: Kay Kemps, MD;  Location: [facility];  Service: Orthopedics;  Laterality: Right;    Social History:  reports that he has never smoked. His smokeless tobacco use includes chew. He reports that he does not drink alcohol  and does not use drugs. BMI: Estimated body mass index is 39.21 kg/m as calculated from the following:   Height as of 12/22/23: 6' 1 (1.854 m).   Weight as of 12/22/23: 134.8 kg.  No results found for: ALBUMIN Diabetes: Patient does not have a diagnosis of diabetes.      Smoking Status:   reports that he has never smoked. His smokeless tobacco use includes chew.    Allergies:  Allergies[1]  Medications: No current facility-administered medications for this encounter.   Current Outpatient Medications  Medication Sig Dispense Refill   acetaminophen  (TYLENOL ) 500 MG tablet Take 500 mg by mouth every 6 (six) hours as needed for moderate pain or headache.     aspirin  EC 81 MG tablet Take 81 mg by mouth daily. Swallow whole.     atorvastatin  (LIPITOR ) 80 MG tablet Take 1 tablet (80 mg total) by mouth daily at 6 PM. 90 tablet 3   docusate sodium  (COLACE) 100 MG capsule Take 200 mg by mouth daily.     ezetimibe  (ZETIA ) 10 MG tablet Take 1 tablet (10 mg total) by mouth daily. 90 tablet 3   hydrochlorothiazide  (HYDRODIURIL ) 25 MG tablet Take 25 mg by mouth daily.     lisinopril  (PRINIVIL ,ZESTRIL ) 20 MG tablet Take 20 mg by mouth daily.     metoprolol  succinate (TOPROL -XL) 50 MG 24 hr tablet Take 25 mg by mouth daily.     nitroGLYCERIN  (NITROSTAT ) 0.4 MG SL tablet Place 1 tablet (0.4 mg total) under the tongue every 5 (five) minutes x 3 doses as needed for chest pain (if no relief afte 2nd dose, proceed to the ED or call 911). 25 tablet 3   pantoprazole  (PROTONIX ) 40 MG tablet Take 40 mg by mouth daily.  2   tirzepatide (ZEPBOUND) 12.5 MG/0.5ML Pen Inject 12.5 mg into the skin once a week.      No results found for this or any previous visit (from the past 48 hours). No results found.  ROS: Pain with rom of the left lower extremity  Physical Exam: Alert and oriented 74 y.o. male in no acute distress Cranial nerves 2-12 intact Cervical spine: full rom with no tenderness, nv intact distally Chest: active breath sounds bilaterally, no wheeze rhonchi or rales Heart: regular rate and rhythm, no murmur Abd: non tender non distended with active bowel sounds Hip is stable with rom  Left knee painful rom with crepitus Nv intact distally No rashes or  edema Antalgic gait  Assessment/Plan Assessment: left knee end stage osteoarthritis  Plan:  Patient will undergo a left total knee by Dr. Kay at [place] Risks benefits and expectations were discussed with the patient. Patient understand risks, benefits and expectations and wishes to proceed. Preoperative templating of the joint replacement has been completed, documented, and submitted to the Operating Room personnel in order to optimize intra-operative equipment management.   Arvella Fireman PA-C, MPAS [facility] is now [employer] [address], [place], [place] [address] Phone: [phone] [email or web address]  [employer]/[email or web address]        [1] No Known Allergies  "

## 2024-02-22 NOTE — Progress Notes (Signed)
 Date of any COVID positive Test in last 90 days:  PCP - Eligio Fairly, MD clearance scanned to [facility] 12-27-2023  Cardiologist - Dorn Ross, MD, Almarie Crate, NP clearance in 12-22-2023 Epic note Endocrinology- sees [place] [facility]  Chest x-ray - 05-08-2020  2v EKG - 12-22-2023  Stress Test -  ECHO - 03-27-2020 Cardiac Cath - 09-01-2017  LHC / CORS- DES x 1 to LAD by Dr. Verlin    CT Coronary Calcium  score:  ZIO monitor-   Pacemaker / ICD device []  No []  Yes   Spinal Cord Stimulator:[]  No []  Yes       History of Sleep Apnea? []  No []  Yes   CPAP used?- []  No []  Yes    Medication on DOS: Metoprolol  (TOPROL -XL), pantoprazole  (PROTONIX ), acetaminophen  (TYLENOL )  Hold DOS: lisinopril  (PRINIVIL , ZESTRIL  ), hydrochlorothiazide  (HYDRODIURIL )  Patient has: []  NO Hx DM   [x]  Pre-DM   []  DM1  []   DM2 Does the patient monitor blood sugar?   []  N/A   [x]  No []  Yes  Last A1c was: 5.8  on 03-29-2019      Does patient have a [employer] or Dexcom? []  No []  Yes   Fasting Blood Sugar Ranges-  Checks Blood Sugar _____ times a day   Tirzepatide (ZEPBOUND) Last injection  Blood Thinner / Instructions: Aspirin  Instructions:  ASA 81mg  hold x 7 d  Activity level: Able to walk up 2 flights of stairs without becoming significantly short of breath or having chest pain?   []    Yes   []  No,  would have:  Patient can perform ADLs without assistance.  []   Yes    []  No   Comments:   Anesthesia review: CAD (DES x 1 to LAD) A.fib vs SVT, GERD, HTN, CKD, OSA- CPAP  Patient denies any S&S of respiratory illness or Covid - no shortness of breath, fever, cough or chest pain at PAT appointment.  Patient verbalized understanding and agreement to the Pre-Surgical Instructions that were given to them at this PAT appointment. Patient was also educated of the need to review these PAT instructions again prior to his/her surgery.I reviewed the appropriate phone numbers to call if they have any and  questions or concerns.

## 2024-02-22 NOTE — Patient Instructions (Signed)
 SURGICAL WAITING ROOM VISITATION Patients having surgery or a procedure may have no more than 2 support people in the waiting area - these visitors may rotate in the visitor waiting room.   If the patient needs to stay at the hospital during part of their recovery, the visitor guidelines for inpatient rooms apply.  PRE-OP VISITATION  Pre-op nurse will coordinate an appropriate time for 1 support person to accompany the patient in pre-op.  This support person may not rotate.  This visitor will be contacted when the time is appropriate for the visitor to come back in the pre-op area.  Please refer to the [facility] website for the visitor guidelines for Inpatients (after your surgery is over and you are in a regular room).  Temporary Visitor Restrictions  Children ages 22 and under will not be able to visit patients in [facility] under most circumstances. Visitation is not restricted outside of hospitals unless noted otherwise in the [facility] and Location Specific Visitation Guidelines at :      [email or web address] Visitors with respiratory illnesses are discouraged from visiting and should remain at home.  You are not required to quarantine at this time prior to your surgery. However, you must do this: Hand Hygiene often Do NOT share personal items Notify your provider if you are in close contact with someone who has COVID or you develop fever 100.4 or greater, new onset of sneezing, cough, sore throat, shortness of breath or body aches.  If you test positive for Covid or have been in contact with anyone that has tested positive in the last 10 days please notify you surgeon.    Your procedure is scheduled on:  FRIDAY  03-02-24  Report to [facility] Main Entrance: [address] entrance where the [employer] is available.   Report to admitting at:  07:15   AM  Call this number if you have any questions or problems the morning of surgery [phone]  Do not eat food  after Midnight the night prior to your surgery/procedure.  After Midnight you may have the following liquids until   06:45 AM DAY OF SURGERY  Clear Liquid Diet Water  Black Coffee (sugar ok, NO MILK/CREAM OR CREAMERS)  Tea (sugar ok, NO MILK/CREAM OR CREAMERS) regular and decaf                             Plain Jell-O  with no fruit (NO RED)                                           Fruit ices (not with fruit pulp, NO RED)                                     Popsicles (NO RED)                                                                  Juice: NO CITRUS JUICES: only apple, WHITE grape, WHITE cranberry Sports drinks like Gatorade or Powerade (NO RED)  The day of surgery:  Drink ONE (1) Pre-Surgery  G2 at   06:45 AM the morning of surgery. Drink in one sitting. Do not sip.  This drink was given to you during your hospital pre-op appointment visit. Nothing else to drink after completing the Pre-Surgery G2 : No candy, chewing gum or throat lozenges.    FOLLOW ANY ADDITIONAL PRE OP INSTRUCTIONS YOU RECEIVED FROM YOUR SURGEON'S OFFICE!!!   Oral Hygiene is also important to reduce your risk of infection.        Remember - BRUSH YOUR TEETH THE MORNING OF SURGERY WITH YOUR REGULAR TOOTHPASTE  Do NOT smoke after Midnight the night before surgery.  Tirzepatide (ZEPBOUND) Last injection at least 7-10 days before surgery.   STOP TAKING all Vitamins, Herbs and supplements 1 week before your surgery.   ASPIRIN - stop taking 7 days before your surgery.  Last dose: 02-24-2024  Take ONLY these medicines the morning of surgery with A SIP OF WATER : Metoprolol  (TOPROL -XL), pantoprazole  (PROTONIX ), and acetaminophen  (TYLENOL ) if needed.  DO NOT TAKE lisinopril  (PRINIVIL , ZESTRIL  ), hydrochlorothiazide  (HYDRODIURIL ), the morning of your surgery.    If You have been diagnosed with Sleep Apnea - Bring CPAP mask and tubing day of surgery. We will provide you with a CPAP machine on the  day of your surgery.                   You may not have any metal on your body including jewelry, and body piercing  Do not wear  lotions, powders, cologne, or deodorant  Men may shave face and neck.  Contacts, Hearing Aids, dentures or bridgework may not be worn into surgery. DENTURES WILL BE REMOVED PRIOR TO SURGERY PLEASE DO NOT APPLY Poly grip OR ADHESIVES!!!  You may bring a small overnight bag with you on the day of surgery, only pack items that are not valuable. [facility] IS NOT RESPONSIBLE   FOR VALUABLES THAT ARE LOST OR STOLEN.   Do not bring your home medications to the hospital. The Pharmacy will dispense medications listed on your medication list to you during your admission in the Hospital.  Special Instructions: Bring a copy of your healthcare power of attorney and living will documents the day of surgery, if you wish to have them scanned into your [facility] Medical Records- EPIC  Please read over the following fact sheets you were given: IF YOU HAVE QUESTIONS ABOUT YOUR PRE-OP INSTRUCTIONS, PLEASE CALL [phone].      Pre-operative 4 CHG Bath Instructions   You can play a key role in reducing the risk of infection after surgery. Your skin needs to be as free of germs as possible. You can reduce the number of germs on your skin by washing with CHG (chlorhexidine  gluconate) soap before surgery. CHG is an antiseptic soap that kills germs and continues to kill germs even after washing.   DO NOT use if you have an allergy to chlorhexidine /CHG or antibacterial soaps. If your skin becomes reddened or irritated, stop using the CHG and notify one of our RNs at [phone]  Please shower with the CHG soap starting 4 days before surgery using the following schedule: [facility]  02-27-2024     Do NOT use CHG soap  the morning of your                                                                                                                                  surgery.         Please keep in mind the following:  DO NOT shave, including legs and underarms, starting the day of your first shower.   You may shave your face at any point before/day of surgery.  Place clean sheets on your bed the day you start using CHG soap. Use a clean washcloth (not used since being washed) for each shower. DO NOT sleep with pets once you start using the CHG.  CHG Shower Instructions:  If you choose to wash your hair and private area, wash first with your normal shampoo/soap.  After you use shampoo/soap, rinse your hair and body thoroughly to remove shampoo/soap residue.  Turn the water  OFF and apply about 3 tablespoons (45 ml) of CHG soap to a CLEAN washcloth.  Apply CHG soap ONLY FROM YOUR NECK DOWN TO YOUR TOES (washing for 3-5 minutes)  DO NOT use CHG soap on face, private areas, open wounds, or sores.  Pay special attention to the area where your surgery is being performed.  If you are having back surgery, having someone wash your back for you may be helpful. Wait 2 minutes after CHG soap is applied, then you may rinse off the CHG soap.  Pat dry with a clean towel  Put on clean clothes/pajamas   If you choose to wear lotion, please use ONLY the CHG-compatible lotions on the back of this paper.     Additional instructions for the day of surgery: DO NOT APPLY any CHG Soap,  lotions, deodorants, cologne, or perfumes on the day of surgery  Put on clean/comfortable clothes.  Brush your teeth.  Ask your nurse before applying any prescription medications to the skin.   CHG Compatible Lotions   Aveeno Moisturizing lotion  Cetaphil Moisturizing Cream  Cetaphil Moisturizing Lotion  Clairol Herbal Essence Moisturizing Lotion, Dry Skin  Clairol Herbal Essence Moisturizing Lotion, Extra Dry Skin  Clairol Herbal Essence Moisturizing Lotion, Normal Skin  Curel Age Defying  Therapeutic Moisturizing Lotion with Alpha Hydroxy  Curel Extreme Care Body Lotion  Curel Soothing Hands Moisturizing Hand Lotion  Curel Therapeutic Moisturizing Cream, Fragrance-Free  Curel Therapeutic Moisturizing Lotion, Fragrance-Free  Curel Therapeutic Moisturizing Lotion, Original Formula  Eucerin Daily Replenishing Lotion  Eucerin Dry Skin Therapy Plus Alpha Hydroxy Crme  Eucerin Dry Skin Therapy Plus Alpha Hydroxy Lotion  Eucerin Original Crme  Eucerin Original Lotion  Eucerin Plus Crme Eucerin Plus Lotion  Eucerin TriLipid Replenishing Lotion  Keri Anti-Bacterial Hand Lotion  Keri Deep Conditioning Original Lotion Dry Skin Formula Softly Scented  Keri Deep Conditioning Original Lotion, Fragrance Free Sensitive Skin Formula  Keri Lotion Fast Absorbing Fragrance Free Sensitive Skin Formula  Keri Lotion Fast Absorbing Softly Scented Dry Skin  Formula  Keri Original Lotion  Keri Skin Renewal Lotion Keri Silky Smooth Lotion  Keri Silky Smooth Sensitive Skin Lotion  Nivea Body Creamy Conditioning Oil  Nivea Body Extra Enriched Lotion  Nivea Body Original Lotion  Nivea Body Sheer Moisturizing Lotion Nivea Crme  Nivea Skin Firming Lotion  NutraDerm 30 Skin Lotion  NutraDerm Skin Lotion  NutraDerm Therapeutic Skin Cream  NutraDerm Therapeutic Skin Lotion  ProShield Protective Hand Cream  Provon moisturizing lotion   FAILURE TO FOLLOW THESE INSTRUCTIONS MAY RESULT IN THE CANCELLATION OF YOUR SURGERY  PATIENT SIGNATURE_________________________________  NURSE SIGNATURE__________________________________  ________________________________________________________________________        SURGICAL WAITING ROOM VISITATION Patients having surgery or a procedure may have no more than 2 support people in the waiting area - these visitors may rotate in the visitor waiting room.   If the patient needs to stay at the hospital during part of their recovery, the visitor guidelines  for inpatient rooms apply.  PRE-OP VISITATION  Pre-op nurse will coordinate an appropriate time for 1 support person to accompany the patient in pre-op.  This support person may not rotate.  This visitor will be contacted when the time is appropriate for the visitor to come back in the pre-op area.  Please refer to the [facility] website for the visitor guidelines for Inpatients (after your surgery is over and you are in a regular room).  Temporary Visitor Restrictions  Children ages 58 and under will not be able to visit patients in [facility] under most circumstances. Visitation is not restricted outside of hospitals unless noted otherwise in the [facility] and Location Specific Visitation Guidelines at :      [email or web address] Visitors with respiratory illnesses are discouraged from visiting and should remain at home.  You are not required to quarantine at this time prior to your surgery. However, you must do this: Hand Hygiene often Do NOT share personal items Notify your provider if you are in close contact with someone who has COVID or you develop fever 100.4 or greater, new onset of sneezing, cough, sore throat, shortness of breath or body aches.  If you test positive for Covid or have been in contact with anyone that has tested positive in the last 10 days please notify you surgeon.    Your procedure is scheduled on:    Report to [facility] Main Entrance: [place] entrance where the [employer] is available.   Report to admitting at:     AM  Call this number if you have any questions or problems the morning of surgery [phone]  Do not eat food after Midnight the night prior to your surgery/procedure.  After Midnight you may have the following liquids until         AM / PM DAY OF SURGERY  Clear Liquid Diet Water  Black Coffee (sugar ok, NO MILK/CREAM OR CREAMERS)  Tea (sugar ok, NO MILK/CREAM OR CREAMERS) regular and decaf                              Plain Jell-O  with no fruit (NO RED)                                           Fruit ices (not with fruit pulp, NO RED)  Popsicles (NO RED)                                                                  Juice: NO CITRUS JUICES: only apple, WHITE grape, WHITE cranberry Sports drinks like Gatorade or Powerade (NO RED)                   The day of surgery:  Drink ONE (1) Pre-Surgery Clear Ensure or G2 at        AM the morning of surgery. Drink in one sitting. Do not sip.  This drink was given to you during your hospital pre-op appointment visit. Nothing else to drink after completing the Pre-Surgery Clear Ensure or G2 : No candy, chewing gum or throat lozenges.    FOLLOW ANY ADDITIONAL PRE OP INSTRUCTIONS YOU RECEIVED FROM YOUR SURGEON'S OFFICE!!!   Oral Hygiene is also important to reduce your risk of infection.        Remember - BRUSH YOUR TEETH THE MORNING OF SURGERY WITH YOUR REGULAR TOOTHPASTE  Do NOT smoke after Midnight the night before surgery.  STOP TAKING all Vitamins, Herbs and supplements 1 week before your surgery.   Take ONLY these medicines the morning of surgery with A SIP OF WATER :    If You have been diagnosed with Sleep Apnea - Bring CPAP mask and tubing day of surgery. We will provide you with a CPAP machine on the day of your surgery.                   You may not have any metal on your body including hair pins, jewelry, and body piercing  Do not wear make-up, lotions, powders, perfumes / cologne, or deodorant  Do not wear nail polish including gel and S&S, artificial / acrylic nails, or any other type of covering on natural nails including finger and toenails. If you have artificial nails, gel coating, etc., that needs to be removed by a nail salon, Please have this removed prior to surgery. Not doing so may mean that your surgery could be cancelled or delayed if the Surgeon or anesthesia staff feels like they are unable to  monitor you safely.   Do not shave 48 hours prior to surgery to avoid nicks in your skin which may contribute to postoperative infections.   Men may shave face and neck.  Contacts, Hearing Aids, dentures or bridgework may not be worn into surgery. DENTURES WILL BE REMOVED PRIOR TO SURGERY PLEASE DO NOT APPLY Poly grip OR ADHESIVES!!!  You may bring a small overnight bag with you on the day of surgery, only pack items that are not valuable. [facility] IS NOT RESPONSIBLE   FOR VALUABLES THAT ARE LOST OR STOLEN.   Patients discharged on the day of surgery will not be allowed to drive home.  Someone NEEDS to stay with you for the first 24 hours after anesthesia.  Do not bring your home medications to the hospital. The Pharmacy will dispense medications listed on your medication list to you during your admission in the Hospital.  Special Instructions: Bring a copy of your healthcare power of attorney and living will documents the day of surgery, if you wish to have them scanned into your [facility] Medical Records-  EPIC  Please read over the following fact sheets you were given: IF YOU HAVE QUESTIONS ABOUT YOUR PRE-OP INSTRUCTIONS, PLEASE CALL [phone].      Pre-operative 4 CHG Bath Instructions   You can play a key role in reducing the risk of infection after surgery. Your skin needs to be as free of germs as possible. You can reduce the number of germs on your skin by washing with CHG (chlorhexidine  gluconate) soap before surgery. CHG is an antiseptic soap that kills germs and continues to kill germs even after washing.   DO NOT use if you have an allergy to chlorhexidine /CHG or antibacterial soaps. If your skin becomes reddened or irritated, stop using the CHG and notify one of our RNs at [phone]  Please shower with the CHG soap starting 4 days before surgery using the following schedule:      Do NOT use CHG soap                                                                                                                       the morning of your                                                                                                                                 surgery.         Please keep in mind the following:  DO NOT shave, including legs and underarms, starting the day of your first shower.   You may shave your face at any point before/day of surgery.  Place clean sheets on your bed the day you start using CHG soap. Use a clean washcloth (not used since being washed) for each shower. DO NOT sleep with pets once you start using the CHG.  CHG Shower Instructions:  If you choose to wash your hair and private area, wash first with your normal shampoo/soap.  After you use shampoo/soap, rinse your hair and body thoroughly to remove shampoo/soap residue.  Turn the water  OFF and apply about 3 tablespoons (45 ml) of CHG soap to a CLEAN washcloth.  Apply CHG soap ONLY FROM YOUR NECK DOWN TO YOUR TOES (washing for 3-5 minutes)  DO NOT use CHG soap on face, private areas, open wounds, or sores.  Pay special attention to the area where your surgery is being performed.  If you are having back surgery, having someone wash your back for you may be helpful.  Wait 2 minutes after CHG soap is applied, then you may rinse off the CHG soap.  Pat dry with a clean towel  Put on clean clothes/pajamas   If you choose to wear lotion, please use ONLY the CHG-compatible lotions on the back of this paper.     Additional instructions for the day of surgery: DO NOT APPLY any CHG Soap,  lotions, deodorants, cologne, or perfumes on the day of surgery  Put on clean/comfortable clothes.  Brush your teeth.  Ask your nurse before applying any prescription medications to the skin.   CHG Compatible Lotions   Aveeno Moisturizing lotion  Cetaphil Moisturizing Cream  Cetaphil Moisturizing Lotion  Clairol Herbal Essence Moisturizing Lotion, Dry Skin  Clairol Herbal Essence  Moisturizing Lotion, Extra Dry Skin  Clairol Herbal Essence Moisturizing Lotion, Normal Skin  Curel Age Defying Therapeutic Moisturizing Lotion with Alpha Hydroxy  Curel Extreme Care Body Lotion  Curel Soothing Hands Moisturizing Hand Lotion  Curel Therapeutic Moisturizing Cream, Fragrance-Free  Curel Therapeutic Moisturizing Lotion, Fragrance-Free  Curel Therapeutic Moisturizing Lotion, Original Formula  Eucerin Daily Replenishing Lotion  Eucerin Dry Skin Therapy Plus Alpha Hydroxy Crme  Eucerin Dry Skin Therapy Plus Alpha Hydroxy Lotion  Eucerin Original Crme  Eucerin Original Lotion  Eucerin Plus Crme Eucerin Plus Lotion  Eucerin TriLipid Replenishing Lotion  Keri Anti-Bacterial Hand Lotion  Keri Deep Conditioning Original Lotion Dry Skin Formula Softly Scented  Keri Deep Conditioning Original Lotion, Fragrance Free Sensitive Skin Formula  Keri Lotion Fast Absorbing Fragrance Free Sensitive Skin Formula  Keri Lotion Fast Absorbing Softly Scented Dry Skin Formula  Keri Original Lotion  Keri Skin Renewal Lotion Keri Silky Smooth Lotion  Keri Silky Smooth Sensitive Skin Lotion  Nivea Body Creamy Conditioning Oil  Nivea Body Extra Enriched Lotion  Nivea Body Original Lotion  Nivea Body Sheer Moisturizing Lotion Nivea Crme  Nivea Skin Firming Lotion  NutraDerm 30 Skin Lotion  NutraDerm Skin Lotion  NutraDerm Therapeutic Skin Cream  NutraDerm Therapeutic Skin Lotion  ProShield Protective Hand Cream  Provon moisturizing lotion   FAILURE TO FOLLOW THESE INSTRUCTIONS MAY RESULT IN THE CANCELLATION OF YOUR SURGERY  PATIENT SIGNATURE_________________________________  NURSE SIGNATURE__________________________________  ________________________________________________________________________        If you would like to see a video about joint replacement:   [email or web address]

## 2024-02-23 ENCOUNTER — Other Ambulatory Visit: Payer: Self-pay

## 2024-02-23 ENCOUNTER — Encounter (HOSPITAL_COMMUNITY)
Admission: RE | Admit: 2024-02-23 | Discharge: 2024-02-23 | Disposition: A | Payer: Medicare (Managed Care) | Source: Ambulatory Visit | Attending: Orthopedic Surgery

## 2024-02-23 ENCOUNTER — Encounter (HOSPITAL_COMMUNITY): Payer: Self-pay

## 2024-02-23 VITALS — BP 127/80 | HR 70 | Temp 98.5°F | Resp 18 | Ht 73.0 in | Wt 288.0 lb

## 2024-02-23 DIAGNOSIS — Z79899 Other long term (current) drug therapy: Secondary | ICD-10-CM

## 2024-02-23 DIAGNOSIS — Z01818 Encounter for other preprocedural examination: Secondary | ICD-10-CM

## 2024-02-23 DIAGNOSIS — M1712 Unilateral primary osteoarthritis, left knee: Secondary | ICD-10-CM

## 2024-02-23 HISTORY — DX: Prediabetes: R73.03

## 2024-02-23 HISTORY — DX: Cardiac arrhythmia, unspecified: I49.9

## 2024-02-23 HISTORY — DX: Depression, unspecified: F32.A

## 2024-02-23 LAB — CBC
HCT: 44.7 % (ref 39.0–52.0)
Hemoglobin: 14.7 g/dL (ref 13.0–17.0)
MCH: 27.8 pg (ref 26.0–34.0)
MCHC: 32.9 g/dL (ref 30.0–36.0)
MCV: 84.7 fL (ref 80.0–100.0)
Platelets: 224 10*3/uL (ref 150–400)
RBC: 5.28 MIL/uL (ref 4.22–5.81)
RDW: 13.6 % (ref 11.5–15.5)
WBC: 5.7 10*3/uL (ref 4.0–10.5)
nRBC: 0 % (ref 0.0–0.2)

## 2024-02-23 LAB — COMPREHENSIVE METABOLIC PANEL WITH GFR
ALT: 31 U/L (ref 0–44)
AST: 33 U/L (ref 15–41)
Albumin: 4.3 g/dL (ref 3.5–5.0)
Alkaline Phosphatase: 97 U/L (ref 38–126)
Anion gap: 14 (ref 5–15)
BUN: 21 mg/dL (ref 8–23)
CO2: 23 mmol/L (ref 22–32)
Calcium: 10.4 mg/dL — ABNORMAL HIGH (ref 8.9–10.3)
Chloride: 100 mmol/L (ref 98–111)
Creatinine, Ser: 1.29 mg/dL — ABNORMAL HIGH (ref 0.61–1.24)
GFR, Estimated: 59 mL/min — ABNORMAL LOW
Glucose, Bld: 91 mg/dL (ref 70–99)
Potassium: 3.2 mmol/L — ABNORMAL LOW (ref 3.5–5.1)
Sodium: 137 mmol/L (ref 135–145)
Total Bilirubin: 0.6 mg/dL (ref 0.0–1.2)
Total Protein: 7.3 g/dL (ref 6.5–8.1)

## 2024-02-23 LAB — SURGICAL PCR SCREEN
MRSA, PCR: NEGATIVE
Staphylococcus aureus: NEGATIVE

## 2024-03-02 ENCOUNTER — Encounter (HOSPITAL_COMMUNITY): Admission: RE | Payer: Self-pay | Source: Ambulatory Visit

## 2024-03-02 ENCOUNTER — Ambulatory Visit (HOSPITAL_COMMUNITY)
Admission: RE | Admit: 2024-03-02 | Payer: Medicare (Managed Care) | Source: Ambulatory Visit | Admitting: Orthopedic Surgery

## 2024-03-02 SURGERY — ARTHROPLASTY, KNEE, TOTAL
Anesthesia: Choice | Site: Knee | Laterality: Left

## 2024-03-13 ENCOUNTER — Ambulatory Visit: Payer: Self-pay | Admitting: Nurse Practitioner
